# Patient Record
Sex: Female | Born: 1960 | Race: White | Hispanic: Yes | Marital: Married | Smoking: Never smoker
Health system: Southern US, Community
[De-identification: ages and names within clinical notes are randomized; demographics above are authoritative.]

## PROBLEM LIST (undated history)

## (undated) DIAGNOSIS — E119 Type 2 diabetes mellitus without complications: Secondary | ICD-10-CM

## (undated) DIAGNOSIS — D649 Anemia, unspecified: Secondary | ICD-10-CM

## (undated) DIAGNOSIS — C221 Intrahepatic bile duct carcinoma: Secondary | ICD-10-CM

## (undated) DIAGNOSIS — R87619 Unspecified abnormal cytological findings in specimens from cervix uteri: Secondary | ICD-10-CM

## (undated) HISTORY — PX: CHOLECYSTECTOMY: SHX55

## (undated) HISTORY — PX: ENDOMETRIAL ABLATION: SHX621

## (undated) HISTORY — PX: LEEP: SHX91

## (undated) HISTORY — DX: Unspecified abnormal cytological findings in specimens from cervix uteri: R87.619

## (undated) HISTORY — PX: HYSTEROSCOPY WITH D & C: SHX1775

---

## 2005-06-06 ENCOUNTER — Ambulatory Visit: Payer: Self-pay | Admitting: Unknown Physician Specialty

## 2006-06-12 ENCOUNTER — Ambulatory Visit: Payer: Self-pay

## 2006-10-14 ENCOUNTER — Ambulatory Visit: Payer: Self-pay | Admitting: Family Medicine

## 2007-06-15 ENCOUNTER — Ambulatory Visit: Payer: Self-pay | Admitting: Unknown Physician Specialty

## 2008-06-20 ENCOUNTER — Ambulatory Visit: Payer: Self-pay | Admitting: Unknown Physician Specialty

## 2009-06-29 ENCOUNTER — Ambulatory Visit: Payer: Self-pay | Admitting: Unknown Physician Specialty

## 2010-07-26 ENCOUNTER — Ambulatory Visit: Payer: Self-pay | Admitting: Unknown Physician Specialty

## 2011-09-05 ENCOUNTER — Ambulatory Visit: Payer: Self-pay | Admitting: Unknown Physician Specialty

## 2012-01-31 ENCOUNTER — Ambulatory Visit: Payer: Self-pay | Admitting: Obstetrics & Gynecology

## 2012-01-31 LAB — CBC
MCV: 82 fL (ref 80–100)
Platelet: 330 10*3/uL (ref 150–440)
RBC: 4.64 10*6/uL (ref 3.80–5.20)
RDW: 15.9 % — ABNORMAL HIGH (ref 11.5–14.5)

## 2012-01-31 LAB — PREGNANCY, URINE: Pregnancy Test, Urine: NEGATIVE m[IU]/mL

## 2012-02-07 ENCOUNTER — Ambulatory Visit: Payer: Self-pay | Admitting: Obstetrics & Gynecology

## 2012-02-10 LAB — PATHOLOGY REPORT

## 2012-09-21 ENCOUNTER — Ambulatory Visit: Payer: Self-pay | Admitting: Unknown Physician Specialty

## 2012-11-17 ENCOUNTER — Ambulatory Visit: Payer: Self-pay | Admitting: General Surgery

## 2014-07-25 DIAGNOSIS — E119 Type 2 diabetes mellitus without complications: Secondary | ICD-10-CM | POA: Insufficient documentation

## 2015-01-15 NOTE — Op Note (Signed)
PATIENT NAME:  Lauren Lindsey, Lauren Lindsey MR#:  [id number] DATE OF BIRTH:  1961-05-28  DATE OF PROCEDURE:  02/07/2012  PREOPERATIVE DIAGNOSES:  1. Abnormal uterine bleeding.  2. Abnormal endometrial thickness.    POSTOPERATIVE DIAGNOSES: 1. Abnormal uterine bleeding. 2. Abnormal endometrial thickness.  PROCEDURE:  1. Hysteroscopy.  2. Dilation and curettage.  3. Endometrial ablation.   SURGEON: Barnett Applebaum, M.D.   ANESTHESIA: General.   ESTIMATED BLOOD LOSS: Minimal.   COMPLICATIONS: None.   FINDINGS: Endometrial polyps.   DISPOSITION: To the recovery room in stable condition.   TECHNIQUE: The patient is prepped and draped in the usual sterile fashion after adequate anesthesia is obtained in the dorsal lithotomy position. The bladder is drained with a Robinson catheter. Speculum is placed and the anterior lip of the cervix is grasped with a tenaculum. The cervix and uterine lengths are measured using a uterine sound with the uterine length 9 cm and cervical length 3 cm. Gentle dilation of the cervix is performed with minimal dilation necessary. A 30-degree hysteroscope with lactated Ringer's distention of the intrauterine cavity is performed with minimal discrepancy of fluid throughout the entire case. Visualization of the endometrial cavity revealed a small endometrial polyp towards the lower uterine segment, normal cornua, and no other abnormalities other than thickened lining. The hysteroscope is removed and using polypectomy forceps the small polyp is removed. An endometrial curettage is  also performed with a banjo curette. Repeat visualization with the hysteroscope reveals complete resection of the polyp. The hysteroscope is removed and then the NovaSure endometrial device is placed. Cavity width is measured at 3 cm. Calculations from the NovaSure device places on a power of 99. The test and then procedure is performed without complication and lasts for 66 seconds. The NovaSure device is then  removed. Repeat hysteroscope reveals a blanched endometrial lining with no perforation or complications witnessed. The hysteroscope is again removed as well as the tenaculum from the cervix with hemostasis assured. The patient goes to the recovery room in stable condition. All sponge, instrument, and needle counts are correct.    ____________________________ R. Barnett Applebaum, MD [id number] D: 02/07/2012 10:44:50 ET T: 02/07/2012 11:41:41 ET JOB#: [id number]  cc: Glean Salen, MD, <Dictator> Gae Dry MD ELECTRONICALLY SIGNED 02/08/2012 9:17

## 2015-02-05 ENCOUNTER — Ambulatory Visit
Admission: EM | Admit: 2015-02-05 | Discharge: 2015-02-05 | Disposition: A | Discharge status: Home / Self Care | Payer: BLUE CROSS/BLUE SHIELD | Attending: Family Medicine | Admitting: Family Medicine

## 2015-02-05 ENCOUNTER — Encounter: Payer: Self-pay | Admitting: Emergency Medicine

## 2015-02-05 DIAGNOSIS — J029 Acute pharyngitis, unspecified: Secondary | ICD-10-CM

## 2015-02-05 DIAGNOSIS — J040 Acute laryngitis: Secondary | ICD-10-CM | POA: Diagnosis not present

## 2015-02-05 DIAGNOSIS — J301 Allergic rhinitis due to pollen: Secondary | ICD-10-CM | POA: Diagnosis not present

## 2015-02-05 MED ORDER — PREDNISONE 10 MG PO TABS: ORAL_TABLET | ORAL | Status: DC

## 2015-02-05 MED ORDER — FLUTICASONE PROPIONATE 50 MCG/ACT NA SUSP: 2.0000 | Freq: Two times a day (BID) | NASAL | Status: DC

## 2015-02-05 MED ORDER — FEXOFENADINE-PSEUDOEPHED ER 60-120 MG PO TB12: 1.0000 | ORAL_TABLET | Freq: Two times a day (BID) | ORAL | Status: DC

## 2015-02-05 NOTE — Discharge Instructions (Signed)
Allergies °Allergies may happen from anything your body is sensitive to. This may be food, medicines, pollens, chemicals, and nearly anything around you in everyday life that produces allergens. An allergen is anything that causes an allergy producing substance. Heredity is often a factor in causing these problems. This means you may have some of the same allergies as your parents. °Food allergies happen in all age groups. Food allergies are some of the most severe and life threatening. Some common food allergies are cow's milk, seafood, eggs, nuts, wheat, and soybeans. °SYMPTOMS  °· Swelling around the mouth. °· An itchy red rash or hives. °· Vomiting or diarrhea. °· Difficulty breathing. °SEVERE ALLERGIC REACTIONS ARE LIFE-THREATENING. °This reaction is called anaphylaxis. It can cause the mouth and throat to swell and cause difficulty with breathing and swallowing. In severe reactions only a trace amount of food (for example, peanut oil in a salad) may cause death within seconds. °Seasonal allergies occur in all age groups. These are seasonal because they usually occur during the same season every year. They may be a reaction to molds, grass pollens, or tree pollens. Other causes of problems are house dust mite allergens, pet dander, and mold spores. The symptoms often consist of nasal congestion, a runny itchy nose associated with sneezing, and tearing itchy eyes. There is often an associated itching of the mouth and ears. The problems happen when you come in contact with pollens and other allergens. Allergens are the particles in the air that the body reacts to with an allergic reaction. This causes you to release allergic antibodies. Through a chain of events, these eventually cause you to release histamine into the blood stream. Although it is meant to be protective to the body, it is this release that causes your discomfort. This is why you were given anti-histamines to feel better.  If you are unable to  pinpoint the offending allergen, it may be determined by skin or blood testing. Allergies cannot be cured but can be controlled with medicine. °Hay fever is a collection of all or some of the seasonal allergy problems. It may often be treated with simple over-the-counter medicine such as diphenhydramine. Take medicine as directed. Do not drink alcohol or drive while taking this medicine. Check with your caregiver or package insert for child dosages. °If these medicines are not effective, there are many new medicines your caregiver can prescribe. Stronger medicine such as nasal spray, eye drops, and corticosteroids may be used if the first things you try do not work well. Other treatments such as immunotherapy or desensitizing injections can be used if all else fails. Follow up with your caregiver if problems continue. These seasonal allergies are usually not life threatening. They are generally more of a nuisance that can often be handled using medicine. °HOME CARE INSTRUCTIONS  °· If unsure what causes a reaction, keep a diary of foods eaten and symptoms that follow. Avoid foods that cause reactions. °· If hives or rash are present: °· Take medicine as directed. °· You may use an over-the-counter antihistamine (diphenhydramine) for hives and itching as needed. °· Apply cold compresses (cloths) to the skin or take baths in cool water. Avoid hot baths or showers. Heat will make a rash and itching worse. °· If you are severely allergic: °· Following a treatment for a severe reaction, hospitalization is often required for closer follow-up. °· Wear a medic-alert bracelet or necklace stating the allergy. °· You and your family must learn how to give adrenaline or use   an anaphylaxis kit.  If you have had a severe reaction, always carry your anaphylaxis kit or EpiPen with you. Use this medicine as directed by your caregiver if a severe reaction is occurring. Failure to do so could have a fatal outcome. SEEK MEDICAL  CARE IF:  You suspect a food allergy. Symptoms generally happen within 30 minutes of eating a food.  Your symptoms have not gone away within 2 days or are getting worse.  You develop new symptoms.  You want to retest yourself or your child with a food or drink you think causes an allergic reaction. Never do this if an anaphylactic reaction to that food or drink has happened before. Only do this under the care of a caregiver. SEEK IMMEDIATE MEDICAL CARE IF:   You have difficulty breathing, are wheezing, or have a tight feeling in your chest or throat.  You have a swollen mouth, or you have hives, swelling, or itching all over your body.  You have had a severe reaction that has responded to your anaphylaxis kit or an EpiPen. These reactions may return when the medicine has worn off. These reactions should be considered life threatening. MAKE SURE YOU:   Understand these instructions.  Will watch your condition.  Will get help right away if you are not doing well or get worse. Document Released: 12/03/2002 Document Revised: 01/04/2013 Document Reviewed: 05/09/2008 [facility] Patient Information 2015 [place], [place]. This information is not intended to replace advice given to you by your health care provider. Make sure you discuss any questions you have with your health care provider.  Hay Fever Hay fever is an allergic reaction to particles in the air. It cannot be passed from person to person. It cannot be cured, but it can be controlled. CAUSES  Hay fever is caused by something that triggers an allergic reaction (allergens). The following are examples of allergens:  Ragweed.  Feathers.  Animal dander.  Grass and tree pollens.  Cigarette smoke.  House dust.  Pollution. SYMPTOMS   Sneezing.  Runny or stuffy nose.  Tearing eyes.  Itchy eyes, nose, mouth, throat, skin, or other area.  Sore throat.  Headache.  Decreased sense of smell or taste. DIAGNOSIS Your  caregiver will perform a physical exam and ask questions about the symptoms you are having.Allergy testing may be done to determine exactly what triggers your hay fever.  TREATMENT   Over-the-counter medicines may help symptoms. These include:  Antihistamines.  Decongestants. These may help with nasal congestion.  Your caregiver may prescribe medicines if over-the-counter medicines do not work.  Some people benefit from allergy shots when other medicines are not helpful. HOME CARE INSTRUCTIONS   Avoid the allergen that is causing your symptoms, if possible.  Take all medicine as told by your caregiver. SEEK MEDICAL CARE IF:   You have severe allergy symptoms and your current medicines are not helping.  Your treatment was working at one time, but you are now experiencing symptoms.  You have sinus congestion and pressure.  You develop a fever or headache.  You have thick nasal discharge.  You have asthma and have a worsening cough and wheezing. SEEK IMMEDIATE MEDICAL CARE IF:   You have swelling of your tongue or lips.  You have trouble breathing.  You feel lightheaded or like you are going to faint.  You have cold sweats.  You have a fever. Document Released: 09/09/2005 Document Revised: 12/02/2011 Document Reviewed: 12/05/2010 [facility] Patient Information 2015 [place], [place]. This information is  not intended to replace advice given to you by your health care provider. Make sure you discuss any questions you have with your health care provider.  Laryngitis At the top of your windpipe is your voice box. It is the source of your voice. Inside your voice box are 2 bands of muscles called vocal cords. When you breathe, your vocal cords are relaxed and open so that air can get into the lungs. When you decide to say something, these cords come together and vibrate. The sound from these vibrations goes into your throat and comes out through your mouth as sound. Laryngitis is  an inflammation of the vocal cords that causes hoarseness, cough, loss of voice, sore throat, and dry throat. Laryngitis can be temporary (acute) or long-term (chronic). Most cases of acute laryngitis improve with time.Chronic laryngitis lasts for more than 3 weeks. CAUSES Laryngitis can often be related to excessive smoking, talking, or yelling, as well as inhalation of toxic fumes and allergies. Acute laryngitis is usually caused by a viral infection, vocal strain, measles or mumps, or bacterial infections. Chronic laryngitis is usually caused by vocal cord strain, vocal cord injury, postnasal drip, growths on the vocal cords, or acid reflux. SYMPTOMS   Cough.  Sore throat.  Dry throat. RISK FACTORS  Respiratory infections.  Exposure to irritating substances, such as cigarette smoke, excessive amounts of alcohol, stomach acids, and workplace chemicals.  Voice trauma, such as vocal cord injury from shouting or speaking too loud. DIAGNOSIS  Your cargiver will perform a physical exam. During the physical exam, your caregiver will examine your throat. The most common sign of laryngitis is hoarseness. Laryngoscopy may be necessary to confirm the diagnosis of this condition. This procedure allows your caregiver to look into the larynx. HOME CARE INSTRUCTIONS  Drink enough fluids to keep your urine clear or pale yellow.  Rest until you no longer have symptoms or as directed by your caregiver.  Breathe in moist air.  Take all medicine as directed by your caregiver.  Do not smoke.  Talk as little as possible (this includes whispering).  Write on paper instead of talking until your voice is back to normal.  Follow up with your caregiver if your condition has not improved after 10 days. SEEK MEDICAL CARE IF:   You have trouble breathing.  You cough up blood.  You have persistent fever.  You have increasing pain.  You have difficulty swallowing. MAKE SURE YOU:  Understand these  instructions.  Will watch your condition.  Will get help right away if you are not doing well or get worse. Document Released: 09/09/2005 Document Revised: 12/02/2011 Document Reviewed: 11/15/2010 [facility] Patient Information 2015 [place], [place]. This information is not intended to replace advice given to you by your health care provider. Make sure you discuss any questions you have with your health care provider.  Sore Throat A sore throat is pain, burning, irritation, or scratchiness of the throat. There is often pain or tenderness when swallowing or talking. A sore throat may be accompanied by other symptoms, such as coughing, sneezing, fever, and swollen neck glands. A sore throat is often the first sign of another sickness, such as a cold, flu, strep throat, or mononucleosis (commonly known as mono). Most sore throats go away without medical treatment. CAUSES  The most common causes of a sore throat include:  A viral infection, such as a cold, flu, or mono.  A bacterial infection, such as strep throat, tonsillitis, or whooping cough.  Seasonal allergies.  Dryness in the air.  Irritants, such as smoke or pollution.  Gastroesophageal reflux disease (GERD). HOME CARE INSTRUCTIONS   Only take over-the-counter medicines as directed by your caregiver.  Drink enough fluids to keep your urine clear or pale yellow.  Rest as needed.  Try using throat sprays, lozenges, or sucking on hard candy to ease any pain (if older than 4 years or as directed).  Sip warm liquids, such as broth, herbal tea, or warm water with honey to relieve pain temporarily. You may also eat or drink cold or frozen liquids such as frozen ice pops.  Gargle with salt water (mix 1 tsp salt with 8 oz of water).  Do not smoke and avoid secondhand smoke.  Put a cool-mist humidifier in your bedroom at night to moisten the air. You can also turn on a hot shower and sit in the bathroom with the door closed for 5-10  minutes. SEEK IMMEDIATE MEDICAL CARE IF:  You have difficulty breathing.  You are unable to swallow fluids, soft foods, or your saliva.  You have increased swelling in the throat.  Your sore throat does not get better in 7 days.  You have nausea and vomiting.  You have a fever or persistent symptoms for more than 2-3 days.  You have a fever and your symptoms suddenly get worse. MAKE SURE YOU:   Understand these instructions.  Will watch your condition.  Will get help right away if you are not doing well or get worse. Document Released: 10/17/2004 Document Revised: 08/26/2012 Document Reviewed: 05/17/2012 [facility] Patient Information 2015 [place], [place]. This information is not intended to replace advice given to you by your health care provider. Make sure you discuss any questions you have with your health care provider.

## 2015-02-05 NOTE — ED Provider Notes (Signed)
CSN: [id number]     Arrival date & time 02/05/15  1024 History   First MD Initiated Contact with Patient 02/05/15 1221     Chief Complaint  Patient presents with  . Laryngitis   (Consider location/radiation/quality/duration/timing/severity/associated sxs/prior Treatment) HPI      54 year old female presents complaining of sore throat, congestion, rhinorrhea, sneezing, cough, losing her voice. She initially started having a sore throat and cough about 6 days ago.  This is been constant, not worsening nor improving. This started after she was exposed to a very large amount of pollen at work. Initially she started having itching of her throat and sneezing and the rest of her symptoms have started since then. Yesterday she felt like she lost her voice, that persists today. The cough is worse at night when she lays down. She denies any fever, chills, NVD, chest pain, shortness of breath. No recent travel or sick contacts.   History reviewed. No pertinent past medical history. History reviewed. No pertinent past surgical history. No family history on file. History  Substance Use Topics  . Smoking status: Never Smoker   . Smokeless tobacco: Not on file  . Alcohol Use: No   OB History    No data available     Review of Systems  Constitutional: Negative for fever and chills.  HENT: Positive for congestion, postnasal drip, rhinorrhea, sore throat and voice change. Negative for ear pain and sinus pressure.   Respiratory: Positive for cough. Negative for shortness of breath and wheezing.   Cardiovascular: Negative for chest pain.  Gastrointestinal: Negative for nausea, vomiting and diarrhea.  Skin: Negative for rash.  All other systems reviewed and are negative.   Allergies  Review of patient's allergies indicates no known allergies.  Home Medications   Prior to Admission medications   Medication Sig Start Date End Date Taking? Authorizing Provider  fexofenadine-pseudoephedrine  (ALLEGRA-D) 60-120 MG per tablet Take 1 tablet by mouth every 12 (twelve) hours. 02/05/15   Freeman Caldron Oaklyn Jakubek, PA-C  fluticasone (FLONASE) 50 MCG/ACT nasal spray Place 2 sprays into both nostrils 2 (two) times daily. Decrease to 2 sprays/nostril daily after 5 days 02/05/15   Liam Graham, PA-C  predniSONE (DELTASONE) 10 MG tablet 4 tabs PO QD for 4 days; 3 tabs PO QD for 3 days; 2 tabs PO QD for 2 days; 1 tab PO QD for 1 day 02/05/15   Liam Graham, PA-C   BP 144/67 mmHg  Pulse 76  Temp(Src) 97.5 F (36.4 C) (Tympanic)  Resp 20  Ht 5\' 5"  (1.651 m)  Wt 179 lb (81.194 kg)  BMI 29.79 kg/m2  SpO2 100%  LMP 01/05/2015 Physical Exam  Constitutional: She is oriented to person, place, and time. Vital signs are normal. She appears well-developed and well-nourished. No distress.  HENT:  Head: Normocephalic and atraumatic.  Right Ear: External ear normal.  Left Ear: External ear normal.  Nose: Nose normal. Right sinus exhibits no maxillary sinus tenderness and no frontal sinus tenderness. Left sinus exhibits no maxillary sinus tenderness and no frontal sinus tenderness.  Mouth/Throat: Oropharynx is clear and moist.  Eyes: Conjunctivae are normal.  Neck: Normal range of motion. Neck supple.  Cardiovascular: Normal rate, regular rhythm and normal heart sounds.   Pulmonary/Chest: Effort normal and breath sounds normal. No respiratory distress.  Lymphadenopathy:    She has no cervical adenopathy.  Neurological: She is alert and oriented to person, place, and time. She has normal strength. Coordination normal.  Skin: Skin  is warm and dry. No rash noted. She is not diaphoretic.  Psychiatric: She has a normal mood and affect. Judgment normal.  Nursing note and vitals reviewed.   ED Course  Procedures (including critical care time) Labs Review Labs Reviewed - No data to display  Imaging Review No results found.   MDM   1. Laryngitis   2. Allergic rhinitis due to pollen   3. Pharyngitis     her physical exam is normal apart from her obvious voice change. Symptoms are most likely related to allergies. We will initiate treatment today with a short course of prednisone as well as starting Allegra-D and Flonase. She will follow-up if she has any worsening in her condition.  Meds ordered this encounter  Medications  . predniSONE (DELTASONE) 10 MG tablet    Sig: 4 tabs PO QD for 4 days; 3 tabs PO QD for 3 days; 2 tabs PO QD for 2 days; 1 tab PO QD for 1 day    Dispense:  30 tablet    Refill:  0    Order Specific Question:  Supervising Provider    Answer:  Sherlene Shams [[id number]]  . fluticasone (FLONASE) 50 MCG/ACT nasal spray    Sig: Place 2 sprays into both nostrils 2 (two) times daily. Decrease to 2 sprays/nostril daily after 5 days    Dispense:  16 g    Refill:  2    Order Specific Question:  Supervising Provider    Answer:  Sherlene Shams [[id number]]  . fexofenadine-pseudoephedrine (ALLEGRA-D) 60-120 MG per tablet    Sig: Take 1 tablet by mouth every 12 (twelve) hours.    Dispense:  30 tablet    Refill:  0    Order Specific Question:  Supervising Provider    Answer:  Sherlene Shams [[id number]]     Liam Graham, PA-C 02/05/15 [phone]

## 2015-02-05 NOTE — ED Notes (Signed)
Pt c/o sorethroat and cough last week and yesterday started loosing her voice associated with greenish sputum, denies any fever at this time

## 2016-07-10 LAB — HM PAP SMEAR: HM Pap smear: NORMAL

## 2016-07-19 ENCOUNTER — Ambulatory Visit
Admission: EM | Admit: 2016-07-19 | Discharge: 2016-07-19 | Disposition: A | Discharge status: Home / Self Care | Payer: PRIVATE HEALTH INSURANCE | Attending: Family Medicine | Admitting: Family Medicine

## 2016-07-19 ENCOUNTER — Encounter: Payer: Self-pay | Admitting: *Deleted

## 2016-07-19 DIAGNOSIS — R58 Hemorrhage, not elsewhere classified: Secondary | ICD-10-CM | POA: Diagnosis not present

## 2016-07-19 HISTORY — DX: Type 2 diabetes mellitus without complications: E11.9

## 2016-07-19 MED ORDER — MUPIROCIN 2 % EX OINT: 1.0000 "application " | TOPICAL_OINTMENT | Freq: Three times a day (TID) | CUTANEOUS | 0 refills | Status: DC

## 2016-07-19 NOTE — Discharge Instructions (Signed)
Keep leg elevated above the heart most of today. Change dressing tomorrow and apply Bactroban 3 times daily.

## 2016-07-19 NOTE — ED Triage Notes (Addendum)
Patient scratched her right lower leg just before admission today and her right leg began bleeding heavily. No previous history bleeding disorder or anticoags.

## 2016-07-19 NOTE — ED Provider Notes (Signed)
CSN: [id number]     Arrival date & time 07/19/16  1346 History   None    Chief Complaint  Patient presents with  . Bleeding/Bruising   (Consider location/radiation/quality/duration/timing/severity/associated sxs/prior Treatment) HPI   This is a 55 year old female who today scratched her left lower leg over a tortuous varicosity began to bleed very heavily. Patient is not taking anticoagulation and has no previous history of any bleeding disorders. She had trouble keeping the wound from bleeding which was described as more pulsatile. He presented to our facility and was placed in a compressive dressing which reportedly soaked through several dressings. Vital signs remained stable throughout her entire stay.      Past Medical History:  Diagnosis Date  . Diabetes mellitus without complication [facility])    Past Surgical History:  Procedure Laterality Date  . ABDOMINAL HYSTERECTOMY     Family History  Problem Relation Age of Onset  . Bleeding Disorder Mother   . Biliary Cirrhosis Mother   . Cancer Father   . Diabetes Father    Social History  Substance Use Topics  . Smoking status: Never Smoker  . Smokeless tobacco: Never Used  . Alcohol use No   OB History    No data available     Review of Systems  Constitutional: Positive for activity change. Negative for chills, fatigue and fever.  Skin: Positive for wound.  All other systems reviewed and are negative.   Allergies  Review of patient's allergies indicates no known allergies.  Home Medications   Prior to Admission medications   Medication Sig Start Date End Date Taking? Authorizing Provider  mupirocin ointment (BACTROBAN) 2 % Apply 1 application topically 3 (three) times daily. 07/19/16   Lorin Picket, PA-C   Meds Ordered and Administered this Visit  Medications - No data to display  BP (!) 143/65 (BP Location: Left Arm)   Pulse 73   Temp 97.3 F (36.3 C) (Tympanic)   Resp 18   Ht 5\' 5"  (1.651 m)   Wt  180 lb (81.6 kg)   LMP 06/19/2016   SpO2 99%   BMI 29.95 kg/m  No data found.   Physical Exam  Constitutional: She is oriented to person, place, and time. She appears well-developed and well-nourished. No distress.  HENT:  Head: Normocephalic and atraumatic.  Eyes: EOM are normal. Pupils are equal, round, and reactive to light.  Neck: Normal range of motion. Neck supple.  Musculoskeletal: Normal range of motion. She exhibits edema, tenderness and deformity.  Neurological: She is alert and oriented to person, place, and time.  Skin: Skin is warm and dry. She is not diaphoretic.  Patient was placed in reverse Trendelenburg and the compression wrapping was then iced. After 15 minutes of compression and elevation and icing the wound was exposed showing a small scratch over a varicosity. The bleeding had subsided.  Psychiatric: She has a normal mood and affect. Her behavior is normal. Judgment and thought content normal.  Nursing note and vitals reviewed.   Urgent Care Course   Clinical Course    Procedures (including critical care time)  Labs Review Labs Reviewed - No data to display  Imaging Review No results found.   Visual Acuity Review  Right Eye Distance:   Left Eye Distance:   Bilateral Distance:    Right Eye Near:   Left Eye Near:    Bilateral Near:     Procedure: Patient was placed in reverse Trendelenburg with compression dressing applied to the  left lower extremity. Ice was also applied. This was maintained for 15 minutes. The compression dressing was then removed showing that a small excoriation was directly over a very tortuous varicosity. Bleeding had subsided. The wound was then thoroughly washed and cleansed with a Hibiclens solution. Another lighter compression dressing was applied holding in place with Coban band.    MDM   1. Venous hemorrhage    Discharge Medication List as of 07/19/2016  2:32 PM    START taking these medications   Details   mupirocin ointment (BACTROBAN) 2 % Apply 1 application topically 3 (three) times daily., Starting Fri 07/19/2016, Normal      Plan: 1. Test/x-ray results and diagnosis reviewed with patient 2. rx as per orders; risks, benefits, potential side effects reviewed with patient 3. Recommend supportive treatment with Elevation and ice this evening. She is to keep the compression dressing this evening and removed it tomorrow. I will keep her out of work tonight and then allow her to return full duty unrestricted tomorrow. I've asked her to begin using Bactroban ointment to prevent secondary infection from the scratch for the next week 3 times daily. She may benefit from use of compression hose at work since she stands all day long. If she has other further bleeding tonight or tomorrow she'll return to our clinic during operating hours or go to the emergency room. 4. F/u prn if symptoms worsen or don't improve     Lorin Picket, PA-C 07/19/16 [place] Pervis Macintyre, PA-C 07/19/16 1943

## 2016-07-29 ENCOUNTER — Other Ambulatory Visit: Payer: Self-pay | Admitting: Obstetrics and Gynecology

## 2016-07-29 DIAGNOSIS — Z1231 Encounter for screening mammogram for malignant neoplasm of breast: Secondary | ICD-10-CM

## 2016-08-02 ENCOUNTER — Ambulatory Visit
Admission: RE | Admit: 2016-08-02 | Discharge: 2016-08-02 | Disposition: A | Discharge status: Home / Self Care | Payer: PRIVATE HEALTH INSURANCE | Source: Ambulatory Visit | Attending: Obstetrics and Gynecology | Admitting: Obstetrics and Gynecology

## 2016-08-02 DIAGNOSIS — Z1231 Encounter for screening mammogram for malignant neoplasm of breast: Secondary | ICD-10-CM | POA: Diagnosis present

## 2016-08-05 ENCOUNTER — Encounter (INDEPENDENT_AMBULATORY_CARE_PROVIDER_SITE_OTHER): Payer: Self-pay | Admitting: Vascular Surgery

## 2016-08-05 ENCOUNTER — Ambulatory Visit (INDEPENDENT_AMBULATORY_CARE_PROVIDER_SITE_OTHER): Payer: PRIVATE HEALTH INSURANCE | Admitting: Vascular Surgery

## 2016-08-05 VITALS — BP 121/65 | HR 67 | Resp 16 | Ht 65.0 in | Wt 173.0 lb

## 2016-08-05 DIAGNOSIS — I872 Venous insufficiency (chronic) (peripheral): Secondary | ICD-10-CM | POA: Insufficient documentation

## 2016-08-05 DIAGNOSIS — I83892 Varicose veins of left lower extremities with other complications: Secondary | ICD-10-CM

## 2016-08-05 NOTE — Progress Notes (Signed)
MRN : [id number]  Lauren Lindsey is a 55 y.o. (09-17-1961) female who presents with chief complaint of  Chief Complaint  Patient presents with  . New Evaluation    Varicose vein bleeding  .  History of Present Illness: The patient is seen for evaluation of symptomatic varicose veins. The patient relates burning and stinging which worsened steadily throughout the course of the day, particularly with standing. The patient also notes an aching and throbbing pain over the varicosities, particularly with prolonged dependent positions. The symptoms are significantly improved with elevation.  The patient also notes that during hot weather the symptoms are greatly intensified. The patient states the pain from the varicose veins interferes with work, daily exercise, shopping and household maintenance. At this point, the symptoms are persistent and severe enough that they're having a negative impact on lifestyle and are interfering with daily activities.  There is no history of DVT, PE or superficial thrombophlebitis. There is no history of ulceration or hemorrhage. The patient denies a significant family history of varicose veins. OB history: P2  The patient has not worn graduated compression in the past. At the present time the patient has not been using over-the-counter analgesics. There is no history of prior surgical intervention or sclerotherapy.    Current Meds  Medication Sig  . mupirocin ointment (BACTROBAN) 2 % Apply 1 application topically 3 (three) times daily.    Past Medical History:  Diagnosis Date  . Diabetes mellitus without complication [facility])     Past Surgical History:  Procedure Laterality Date  . ABDOMINAL HYSTERECTOMY      Social History Social History  Substance Use Topics  . Smoking status: Never Smoker  . Smokeless tobacco: Never Used  . Alcohol use No    Family History Family History  Problem Relation Age of Onset  . Bleeding Disorder Mother   .  Biliary Cirrhosis Mother   . Cancer Father   . Diabetes Father   No family history of bleeding clotting disorders, porphyria or autoimmune disease.  No Known Allergies   REVIEW OF SYSTEMS (Negative unless checked)  Constitutional: [] Weight loss  [] Fever  [] Chills Cardiac: [] Chest pain   [] Chest pressure   [] Palpitations   [] Shortness of breath when laying flat   [] Shortness of breath with exertion. Vascular:  [] Pain in legs with walking   [] Pain in legs at rest  [] History of DVT   [] Phlebitis   [x] Swelling in legs   [x] Varicose veins   [x] Non-healing ulcers Pulmonary:   [] Uses home oxygen   [] Productive cough   [] Hemoptysis   [] Wheeze  [] COPD   [] Asthma Neurologic:  [] Dizziness   [] Seizures   [] History of stroke   [] History of TIA  [] Aphasia   [] Vissual changes   [] Weakness or numbness in arm   [] Weakness or numbness in leg Musculoskeletal:   [] Joint swelling   [] Joint pain   [] Low back pain Hematologic:  [] Easy bruising  [] Easy bleeding   [] Hypercoagulable state   [] Anemic Gastrointestinal:  [] Diarrhea   [] Vomiting  [] Gastroesophageal reflux/heartburn   [] Difficulty swallowing. Genitourinary:  [] Chronic kidney disease   [] Difficult urination  [] Frequent urination   [] Blood in urine Skin:  [] Rashes   [x] Ulcers  Psychological:  [] History of anxiety   []  History of major depression.  Physical Examination  Vitals:   08/05/16 1405  BP: 121/65  Pulse: 67  Resp: 16  Weight: 173 lb (78.5 kg)  Height: 5\' 5"  (1.651 m)   Body mass index is 28.79  kg/m. Gen: WD/WN, NAD Head: [place]/AT, No temporalis wasting.  Ear/Nose/Throat: Hearing grossly intact, nares w/o erythema or drainage, poor dentition Eyes: PER, EOMI, sclera nonicteric.  Neck: Supple, no masses.  No bruit or JVD.  Pulmonary:  Good air movement, clear to auscultation bilaterally, no use of accessory muscles.  Cardiac: RRR, normal S1, S2, no Murmurs. Vascular: Varicosities noted greater than 7 mm in diameter left leg more affected  than the right. Mild venous stasis changes noted left ankle. There is a dense cluster just above the medial malleolus on the left which has a small ulcer with thrombosis in the bed. Vessel Right Left  Radial Palpable Palpable  Ulnar Palpable Palpable  Brachial Palpable Palpable  Carotid Palpable Palpable  Femoral Palpable Palpable  Popliteal Palpable Palpable  PT Palpable Palpable  DP Palpable Palpable   Gastrointestinal: soft, non-distended. No guarding/no peritoneal signs.  Musculoskeletal: M/S 5/5 throughout.  No deformity or atrophy.  Neurologic: CN 2-12 intact. Pain and light touch intact in extremities.  Symmetrical.  Speech is fluent. Motor exam as listed above. Psychiatric: Judgment intact, Mood & affect appropriate for pt's clinical situation. Dermatologic: No rashes or ulcers noted.  No changes consistent with cellulitis. Lymph : No Cervical lymphadenopathy, no lichenification or skin changes of chronic lymphedema.  CBC Lab Results  Component Value Date   WBC 7.2 01/31/2012   HGB 12.4 01/31/2012   HCT 37.9 01/31/2012   MCV 82 01/31/2012   PLT 330 01/31/2012    BMET No results found for: NA, K, CL, CO2, GLUCOSE, BUN, CREATININE, CALCIUM, GFRNONAA, GFRAA CrCl cannot be calculated (No order found.).  COAG No results found for: INR, PROTIME  Radiology Mm Digital Screening Bilateral  Result Date: 08/02/2016 CLINICAL DATA:  Screening. EXAM: DIGITAL SCREENING BILATERAL MAMMOGRAM WITH CAD COMPARISON:  Previous exam(s). ACR Breast Density Category b: There are scattered areas of fibroglandular density. FINDINGS: There are no findings suspicious for malignancy. Images were processed with CAD. IMPRESSION: No mammographic evidence of malignancy. A result letter of this screening mammogram will be mailed directly to the patient. RECOMMENDATION: Screening mammogram in one year. (Code:SM-B-01Y) BI-RADS CATEGORY  1: Negative. Electronically Signed   By: Everlean Alstrom M.D.   On:  08/02/2016 12:14     Assessment/Plan 1. Hemorrhage of varicose veins of left lower extremity  Recommend:  The patient has large symptomatic varicose veins that are painful and associated with swelling. She has now had several episodes of bleeding and a small ulcerated area.  I have had a long discussion with the patient regarding  varicose veins and why they cause symptoms.  Patient will begin wearing graduated compression stockings class 1 on a daily basis, beginning first thing in the morning and removing them in the evening. The patient is instructed specifically not to sleep in the stockings.    The patient  will also begin using over-the-counter analgesics such as Motrin 600 mg po TID to help control the symptoms.    In addition, behavioral modification including elevation during the day will be initiated.    Pending the results of these changes the  patient will be reevaluated ASAP.   An  ultrasound of the venous system will be obtained ASAP.   Further plans will be based on the ultrasound results and whether conservative therapies are successful at eliminating the pain and swelling.   - VAS [place] LOWER EXTREMITY VENOUS REFLUX; Future  2. Chronic venous insufficiency Continue compression    Hortencia Pilar, MD  08/05/2016 2:37 PM

## 2016-08-06 ENCOUNTER — Ambulatory Visit (INDEPENDENT_AMBULATORY_CARE_PROVIDER_SITE_OTHER): Payer: PRIVATE HEALTH INSURANCE

## 2016-08-06 ENCOUNTER — Ambulatory Visit (INDEPENDENT_AMBULATORY_CARE_PROVIDER_SITE_OTHER): Payer: PRIVATE HEALTH INSURANCE | Admitting: Vascular Surgery

## 2016-08-06 ENCOUNTER — Encounter (INDEPENDENT_AMBULATORY_CARE_PROVIDER_SITE_OTHER): Payer: Self-pay | Admitting: Vascular Surgery

## 2016-08-06 VITALS — BP 129/75 | HR 70 | Resp 17 | Ht 65.0 in | Wt 173.0 lb

## 2016-08-06 DIAGNOSIS — I872 Venous insufficiency (chronic) (peripheral): Secondary | ICD-10-CM

## 2016-08-06 DIAGNOSIS — I83892 Varicose veins of left lower extremities with other complications: Secondary | ICD-10-CM

## 2016-08-06 NOTE — Progress Notes (Signed)
Subjective:    Patient ID: Lauren Lindsey, female    DOB: 1961-02-22, 55 y.o.   MRN: [id number] Chief Complaint  Patient presents with  . Follow-up   Patient presents to review vascular studies. Last seen yesterday with a chief complaint of hemorrhage from a varicosity located near her left ankle. Patient underwent a left lower extremity venous duplex which was notable for DVT, SVT and reflux in the GSV (midthigh to just below knee).   Review of Systems Review of Systems  Constitutional: Negative.   HENT: Negative.   Eyes: Negative.   Respiratory: Negative.   Cardiovascular: Negative.   Gastrointestinal: Negative.   Endocrine: Negative.   Genitourinary: Negative.   Musculoskeletal: Negative.   Skin:       Bleeding left ankle varicosity.  Allergic/Immunologic: Negative.   Neurological: Negative.   Hematological: Negative.   Psychiatric/Behavioral: Negative.       Objective:   Physical Exam Physical Exam  Constitutional: She is oriented to person, place, and time. She appears well-developed and well-nourished.  HENT:  Head: Normocephalic and atraumatic.  Eyes: Conjunctivae and EOM are normal. Pupils are equal, round, and reactive to light.  Neck: Normal range of motion.  Cardiovascular: Normal rate, regular rhythm, normal heart sounds and intact distal pulses.   Pulses:      Radial pulses are 2+ on the right side, and 2+ on the left side.       Dorsalis pedis pulses are 2+ on the right side, and 2+ on the left side.       Posterior tibial pulses are 2+ on the right side, and 2+ on the left side.  Pulmonary/Chest: Effort normal and breath sounds normal.  Abdominal: Soft. Bowel sounds are normal.  Musculoskeletal: Normal range of motion. She exhibits edema (Mild Bilateral Edema).  Neurological: She is alert and oriented to person, place, and time.  Skin:  Medial aspect of left ankle. Small skin ulceration noted right on top of varicosities with notable about of surrounding  varicosities.   Psychiatric: She has a normal mood and affect. Her behavior is normal. Judgment and thought content normal.   BP 129/75   Pulse 70   Resp 17   Ht 5\' 5"  (1.651 m)   Wt 173 lb (78.5 kg)   LMP 07/20/2016   BMI 28.79 kg/m   Past Medical History:  Diagnosis Date  . Diabetes mellitus without complication [facility])    Social History   Social History  . Marital status: Married    Spouse name: N/A  . Number of children: N/A  . Years of education: N/A   Occupational History  . Not on file.   Social History Main Topics  . Smoking status: Never Smoker  . Smokeless tobacco: Never Used  . Alcohol use No  . Drug use: No  . Sexual activity: Not on file   Other Topics Concern  . Not on file   Social History Narrative  . No narrative on file   Past Surgical History:  Procedure Laterality Date  . ABDOMINAL HYSTERECTOMY     Family History  Problem Relation Age of Onset  . Bleeding Disorder Mother   . Biliary Cirrhosis Mother   . Cancer Father   . Diabetes Father    No Known Allergies     Assessment & Plan:   Patient presents to review vascular studies. Last seen yesterday with a chief complaint of hemorrhage from a varicosity located near her left ankle. Patient underwent a  left lower extremity venous duplex which was notable for DVT, SVT and reflux in the GSV (midthigh to just below knee).   1. Hemorrhage of varicose veins of lower extremity, left - Stable Will apply for saline sclerotherapy to area in an attempt to prevent another hemorrhage requiring treatment in the ED.   2. Varicose veins, lower extremity, with inflammation, ulcerated, left (HCC) - Stable The patient was encouraged to wear graduated compression stockings (20-30 mmHg) on a daily basis. The patient was instructed to begin wearing the stockings first thing in the morning and removing them in the evening. The patient was instructed specifically not to sleep in the stockings. Prescription In  addition, behavioral modification including elevation during the day will be initiated. Anti-inflammatories for pain.  3. Chronic venous insufficiency - New The patient is likely to benefit from endovenous laser ablation. I have discussed the risks and benefits of the procedure. The risks primarily include DVT, recanalization, bleeding, infection, and inability to gain access. Will focus on area of ulceration due to causing hemorrhage first.   Current Outpatient Prescriptions on File Prior to Visit  Medication Sig Dispense Refill  . mupirocin ointment (BACTROBAN) 2 % Apply 1 application topically 3 (three) times daily. 22 g 0   No current facility-administered medications on file prior to visit.     There are no Patient Instructions on file for this visit. No Follow-up on file.   Jabaree Mercado A Emiko Osorto, PA-C

## 2016-08-12 ENCOUNTER — Ambulatory Visit (INDEPENDENT_AMBULATORY_CARE_PROVIDER_SITE_OTHER): Payer: PRIVATE HEALTH INSURANCE | Admitting: Vascular Surgery

## 2016-08-12 ENCOUNTER — Encounter (INDEPENDENT_AMBULATORY_CARE_PROVIDER_SITE_OTHER): Payer: Self-pay | Admitting: Vascular Surgery

## 2016-08-12 VITALS — BP 126/71 | HR 62 | Resp 17 | Wt 174.0 lb

## 2016-08-12 DIAGNOSIS — I872 Venous insufficiency (chronic) (peripheral): Secondary | ICD-10-CM

## 2016-08-12 NOTE — Progress Notes (Signed)
Varicose veins of left lower extremity with Hemorrhage  Current Plans  Indication: Patient presents with symptomatic varicose veins / hemorrhage of the leftlower extremity.  Procedure: Sclerotherapy using hypertonic saline mixed with 1% Lidocaine was performed on the leftlower extremity. Compression wraps were placed. The patient tolerated the procedure well.  Plan: Follow up as needed.

## 2016-09-26 ENCOUNTER — Ambulatory Visit (INDEPENDENT_AMBULATORY_CARE_PROVIDER_SITE_OTHER): Payer: PRIVATE HEALTH INSURANCE | Admitting: Vascular Surgery

## 2016-09-30 ENCOUNTER — Encounter (INDEPENDENT_AMBULATORY_CARE_PROVIDER_SITE_OTHER): Payer: Self-pay | Admitting: Vascular Surgery

## 2016-09-30 ENCOUNTER — Ambulatory Visit (INDEPENDENT_AMBULATORY_CARE_PROVIDER_SITE_OTHER): Payer: PRIVATE HEALTH INSURANCE | Admitting: Vascular Surgery

## 2016-09-30 VITALS — BP 107/70 | HR 58 | Resp 17 | Wt 173.0 lb

## 2016-09-30 DIAGNOSIS — I872 Venous insufficiency (chronic) (peripheral): Secondary | ICD-10-CM

## 2016-09-30 NOTE — Progress Notes (Signed)
Varicose veins of left lower extremity with Hemorrhage:  Current Plans:  Indication: Patient presents with symptomatic varicose veins / hemorrhage of the leftlower extremity.  Procedure: Sclerotherapy using hypertonic saline mixed with 1% Lidocaine was performed on the leftlower extremity. Compression wraps were placed. The patient tolerated the procedure well.  Plan: Follow up as needed.

## 2016-10-28 ENCOUNTER — Ambulatory Visit (INDEPENDENT_AMBULATORY_CARE_PROVIDER_SITE_OTHER): Payer: PRIVATE HEALTH INSURANCE | Admitting: Vascular Surgery

## 2016-11-11 ENCOUNTER — Ambulatory Visit (INDEPENDENT_AMBULATORY_CARE_PROVIDER_SITE_OTHER): Payer: PRIVATE HEALTH INSURANCE | Admitting: Vascular Surgery

## 2016-11-26 ENCOUNTER — Encounter (INDEPENDENT_AMBULATORY_CARE_PROVIDER_SITE_OTHER): Payer: Self-pay

## 2016-11-26 ENCOUNTER — Encounter (INDEPENDENT_AMBULATORY_CARE_PROVIDER_SITE_OTHER): Payer: Self-pay | Admitting: Vascular Surgery

## 2016-11-26 ENCOUNTER — Ambulatory Visit (INDEPENDENT_AMBULATORY_CARE_PROVIDER_SITE_OTHER): Payer: PRIVATE HEALTH INSURANCE | Admitting: Vascular Surgery

## 2016-11-26 VITALS — BP 109/55 | HR 56 | Resp 16 | Ht 65.0 in | Wt 176.0 lb

## 2016-11-26 DIAGNOSIS — I83812 Varicose veins of left lower extremities with pain: Secondary | ICD-10-CM

## 2016-11-26 DIAGNOSIS — I872 Venous insufficiency (chronic) (peripheral): Secondary | ICD-10-CM

## 2016-11-26 NOTE — Progress Notes (Signed)
Varicose veins of left lower extremity with Hemorrhage:  Current Plans:  Indication: Patient presents with symptomatic varicose veins / hemorrhage of the leftlower extremity.  Procedure: Sclerotherapy using hypertonic saline mixed with 1% Lidocaine was performed on the leftlower extremity. Compression wraps were placed. The patient tolerated the procedure well.  Plan: Follow up as needed.

## 2016-12-19 ENCOUNTER — Ambulatory Visit (INDEPENDENT_AMBULATORY_CARE_PROVIDER_SITE_OTHER): Payer: PRIVATE HEALTH INSURANCE | Admitting: Vascular Surgery

## 2016-12-19 ENCOUNTER — Encounter (INDEPENDENT_AMBULATORY_CARE_PROVIDER_SITE_OTHER): Payer: Self-pay

## 2016-12-19 ENCOUNTER — Encounter (INDEPENDENT_AMBULATORY_CARE_PROVIDER_SITE_OTHER): Payer: Self-pay | Admitting: Vascular Surgery

## 2016-12-19 VITALS — BP 95/58 | HR 65 | Resp 17 | Wt 175.0 lb

## 2016-12-19 DIAGNOSIS — I872 Venous insufficiency (chronic) (peripheral): Secondary | ICD-10-CM

## 2016-12-19 NOTE — Progress Notes (Signed)
Varicose veins of left lower extremity with inflammation (454.1  I83.10) Current Plans   Indication: Patient presents with symptomatic varicose veins of the left lower extremity.   Procedure: Sclerotherapy using hypertonic saline mixed with 1% Lidocaine was performed on the left lower extremity. Compression wraps were placed. The patient tolerated the procedure well. 

## 2017-10-24 ENCOUNTER — Encounter: Payer: Self-pay | Admitting: Obstetrics and Gynecology

## 2017-10-24 ENCOUNTER — Ambulatory Visit (INDEPENDENT_AMBULATORY_CARE_PROVIDER_SITE_OTHER): Payer: PRIVATE HEALTH INSURANCE | Admitting: Obstetrics and Gynecology

## 2017-10-24 VITALS — BP 124/78 | Ht 65.0 in | Wt 186.0 lb

## 2017-10-24 DIAGNOSIS — Z1339 Encounter for screening examination for other mental health and behavioral disorders: Secondary | ICD-10-CM | POA: Diagnosis not present

## 2017-10-24 DIAGNOSIS — Z01419 Encounter for gynecological examination (general) (routine) without abnormal findings: Secondary | ICD-10-CM

## 2017-10-24 DIAGNOSIS — Z1331 Encounter for screening for depression: Secondary | ICD-10-CM | POA: Diagnosis not present

## 2017-10-24 DIAGNOSIS — L720 Epidermal cyst: Secondary | ICD-10-CM

## 2017-10-24 NOTE — Progress Notes (Signed)
Routine Annual Gynecology Examination   PCP: [name], MD  Chief Complaint  Patient presents with  . Annual Exam   History of Present Illness: Patient is a 57 y.o. G0P0000 presents for annual exam. The patient has no complaints today.   Menopausal bleeding: still menstruatin. Had monthly menses until October of last year. Then, no menses until yesterday.  Menopausal symptoms: denies  Breast symptoms: denies  Last pap smear: 06/2016 Result Normal  Last mammogram: 07/2016 Result Normal   Noted a bump about 1 week ago on her right vulva.  Not painful.   Past Medical History:  Diagnosis Date  . Abnormal Pap smear of cervix   . Diabetes mellitus without complication [facility])     Past Surgical History:  Procedure Laterality Date  . ENDOMETRIAL ABLATION    . HYSTEROSCOPY W/D&C    . LEEP      Medications: Denies   Allergies: No Known Allergies  Obstetric History: G0P0000  Social History   Socioeconomic History  . Marital status: Married    Spouse name: Not on file  . Number of children: Not on file  . Years of education: Not on file  . Highest education level: Not on file  Social Needs  . Financial resource strain: Not on file  . Food insecurity - worry: Not on file  . Food insecurity - inability: Not on file  . Transportation needs - medical: Not on file  . Transportation needs - non-medical: Not on file  Occupational History  . Not on file  Tobacco Use  . Smoking status: Never Smoker  . Smokeless tobacco: Never Used  Substance and Sexual Activity  . Alcohol use: No  . Drug use: No  . Sexual activity: Yes  Other Topics Concern  . Not on file  Social History Narrative  . Not on file    Family History  Problem Relation Age of Onset  . Bleeding Disorder Mother   . Biliary Cirrhosis Mother   . Liver cancer Mother   . Hypertension Mother   . Cancer Father   . Diabetes Father     Review of Systems  Constitutional: Negative.   HENT:  Negative.   Eyes: Negative.   Respiratory: Negative.   Cardiovascular: Negative.   Gastrointestinal: Negative.   Genitourinary: Negative.   Musculoskeletal: Negative.   Skin: Negative.   Neurological: Negative.   Psychiatric/Behavioral: Negative.      Physical Exam Vitals: BP 124/78   Ht 5\' 5"  (1.651 m)   Wt 186 lb (84.4 kg)   LMP 10/23/2017   BMI 30.95 kg/m   Physical Exam  Constitutional: She is oriented to person, place, and time. She appears well-developed and well-nourished. No distress.  Genitourinary: Uterus normal. Pelvic exam was performed with patient supine.  There is lesion (see image (~1.32mm nodule, mobile, nontender, no erythema) on the right labia. There is no rash, tenderness or injury on the right labia. There is no rash, tenderness, lesion or injury on the left labia.    No erythema, tenderness or bleeding in the vagina. No signs of injury around the vagina. No vaginal discharge found. Right adnexum does not display mass, does not display tenderness and does not display fullness. Left adnexum does not display mass, does not display tenderness and does not display fullness. Cervix does not exhibit motion tenderness, lesion, discharge or polyp.   Uterus is mobile and anteverted. Uterus is not enlarged, tender or exhibiting a mass.  HENT:  Head: Normocephalic  and atraumatic.  Eyes: EOM are normal. No scleral icterus.  Neck: Normal range of motion. Neck supple. No thyromegaly present.  Cardiovascular: Normal rate and regular rhythm. Exam reveals no gallop and no friction rub.  No murmur heard. Pulmonary/Chest: Effort normal and breath sounds normal. No respiratory distress. She has no wheezes. She has no rales. Right breast exhibits no inverted nipple, no mass, no nipple discharge, no skin change and no tenderness. Left breast exhibits no inverted nipple, no mass, no nipple discharge, no skin change and no tenderness.  Abdominal: Soft. Bowel sounds are normal. She  exhibits no distension and no mass. There is no tenderness. There is no rebound and no guarding.  Musculoskeletal: Normal range of motion. She exhibits no edema or tenderness.  Lymphadenopathy:    She has no cervical adenopathy.       Right: No inguinal adenopathy present.       Left: No inguinal adenopathy present.  Neurological: She is alert and oriented to person, place, and time. No cranial nerve deficit.  Skin: Skin is warm and dry. No rash noted. No erythema.  Psychiatric: She has a normal mood and affect. Her behavior is normal. Judgment normal.     Female chaperone present for pelvic and breast  portions of the physical exam  Results: AUDIT Questionnaire (screen for alcoholism): 0 PHQ-9: 0   Assessment and Plan:  57 y.o. G0P0000 female here for routine annual gynecologic examination  Plan: Problem List Items Addressed This Visit    None    Visit Diagnoses    Women's annual routine gynecological examination    -  Primary   Screening for depression       Screening for alcoholism       Epidermal inclusion cyst          Screening: -- Blood pressure screen normal -- Colonoscopy - not due -- Mammogram - due. Patient to call [place] to arrange. She understands that it is her responsibility to arrange this. -- Weight screening: obese: discussed management options, including lifestyle, dietary, and exercise. -- Depression screening negative (PHQ-9) -- Nutrition: normal -- cholesterol screening: per PCP -- osteoporosis screening: not due -- tobacco screening: not using -- alcohol screening: AUDIT questionnaire indicates low-risk usage. -- family history of breast cancer screening: done. not at high risk. -- no evidence of domestic violence or intimate partner violence. -- STD screening: gonorrhea/chlamydia NAAT not collected per patient request. -- pap smear not collected per ASCCP guidelines -- flu vaccine declines -- HPV vaccination series: not eligilbe  Epidermal  inclusion cyst of labium minus: patient reassured that this is a normal and common finding that is benign. Continue to monitor.   [name], MD 10/27/2017 3:07 PM

## 2017-11-05 ENCOUNTER — Other Ambulatory Visit: Payer: Self-pay | Admitting: Obstetrics and Gynecology

## 2017-11-05 DIAGNOSIS — Z1231 Encounter for screening mammogram for malignant neoplasm of breast: Secondary | ICD-10-CM

## 2017-11-18 ENCOUNTER — Ambulatory Visit
Admission: RE | Admit: 2017-11-18 | Discharge: 2017-11-18 | Disposition: A | Discharge status: Home / Self Care | Payer: PRIVATE HEALTH INSURANCE | Source: Ambulatory Visit | Attending: Obstetrics and Gynecology | Admitting: Obstetrics and Gynecology

## 2017-11-18 DIAGNOSIS — Z1231 Encounter for screening mammogram for malignant neoplasm of breast: Secondary | ICD-10-CM | POA: Diagnosis present

## 2019-01-26 ENCOUNTER — Ambulatory Visit: Payer: PRIVATE HEALTH INSURANCE | Admitting: Obstetrics and Gynecology

## 2019-01-28 ENCOUNTER — Ambulatory Visit: Payer: PRIVATE HEALTH INSURANCE | Admitting: Obstetrics and Gynecology

## 2019-07-08 ENCOUNTER — Other Ambulatory Visit (HOSPITAL_COMMUNITY)
Admission: RE | Admit: 2019-07-08 | Discharge: 2019-07-08 | Disposition: A | Discharge status: Home / Self Care | Payer: BC Managed Care – PPO | Source: Ambulatory Visit | Attending: Obstetrics and Gynecology | Admitting: Obstetrics and Gynecology

## 2019-07-08 ENCOUNTER — Other Ambulatory Visit: Payer: Self-pay

## 2019-07-08 ENCOUNTER — Encounter: Payer: Self-pay | Admitting: Obstetrics and Gynecology

## 2019-07-08 ENCOUNTER — Ambulatory Visit (INDEPENDENT_AMBULATORY_CARE_PROVIDER_SITE_OTHER): Payer: BC Managed Care – PPO | Admitting: Obstetrics and Gynecology

## 2019-07-08 VITALS — BP 153/88 | Ht 65.0 in | Wt 174.0 lb

## 2019-07-08 DIAGNOSIS — Z1331 Encounter for screening for depression: Secondary | ICD-10-CM

## 2019-07-08 DIAGNOSIS — Z01419 Encounter for gynecological examination (general) (routine) without abnormal findings: Secondary | ICD-10-CM

## 2019-07-08 DIAGNOSIS — Z1339 Encounter for screening examination for other mental health and behavioral disorders: Secondary | ICD-10-CM

## 2019-07-08 DIAGNOSIS — Z124 Encounter for screening for malignant neoplasm of cervix: Secondary | ICD-10-CM

## 2019-07-08 NOTE — Progress Notes (Signed)
Routine Annual Gynecology Examination   PCP: [name], MD  Chief Complaint  Patient presents with  . Gynecologic Exam   History of Present Illness: Patient is a 58 y.o. G0P0000 presents for annual exam. The patient has no complaints today.   Menopausal bleeding: Her last period was 11/2018. She had a couple of spots and that was it.    Menopausal symptoms: denies  Breast symptoms: denies  Last pap smear: 06/2016 Result Normal  Last mammogram: 10/2017 Result Normal   Last Colonoscopy: age 89  Past Medical History:  Diagnosis Date  . Abnormal Pap smear of cervix   . Diabetes mellitus without complication [facility])     Past Surgical History:  Procedure Laterality Date  . ENDOMETRIAL ABLATION    . HYSTEROSCOPY W/D&C    . LEEP      Medications: Metformin   Allergies: No Known Allergies  Obstetric History: G0P0000  Social History   Socioeconomic History  . Marital status: Married    Spouse name: Not on file  . Number of children: Not on file  . Years of education: Not on file  . Highest education level: Not on file  Occupational History  . Not on file  Social Needs  . Financial resource strain: Not on file  . Food insecurity    Worry: Not on file    Inability: Not on file  . Transportation needs    Medical: Not on file    Non-medical: Not on file  Tobacco Use  . Smoking status: Never Smoker  . Smokeless tobacco: Never Used  Substance and Sexual Activity  . Alcohol use: No  . Drug use: No  . Sexual activity: Yes  Lifestyle  . Physical activity    Days per week: Not on file    Minutes per session: Not on file  . Stress: Not on file  Relationships  . Social Herbalist on phone: Not on file    Gets together: Not on file    Attends religious service: Not on file    Active member of club or organization: Not on file    Attends meetings of clubs or organizations: Not on file    Relationship status: Not on file  . Intimate partner  violence    Fear of current or ex partner: Not on file    Emotionally abused: Not on file    Physically abused: Not on file    Forced sexual activity: Not on file  Other Topics Concern  . Not on file  Social History Narrative  . Not on file    Family History  Problem Relation Age of Onset  . Bleeding Disorder Mother   . Biliary Cirrhosis Mother   . Liver cancer Mother   . Hypertension Mother   . Diabetes Father   . Lung cancer Father   . Breast cancer Neg Hx     Review of Systems  Constitutional: Negative.   HENT: Negative.   Eyes: Negative.   Respiratory: Negative.   Cardiovascular: Negative.   Gastrointestinal: Negative.   Genitourinary: Negative.   Musculoskeletal: Negative.   Skin: Negative.   Neurological: Negative.   Psychiatric/Behavioral: Negative.      Physical Exam Vitals: BP (!) 153/88   Ht 5\' 5"  (1.651 m)   Wt 174 lb (78.9 kg)   LMP 11/24/2018 (Approximate)   BMI 28.96 kg/m   Physical Exam Constitutional:      General: She is not in acute distress.  Appearance: She is well-developed.  Genitourinary:     Pelvic exam was performed with patient supine.     Uterus normal.     No signs of injury in the vagina.     No vaginal discharge, erythema, tenderness or bleeding.     No cervical motion tenderness, discharge, lesion or polyp.     Uterus is mobile.     Uterus is not enlarged or tender.     No uterine mass detected.    Uterus is anteverted.     No right or left adnexal mass present.     Right adnexa not tender or full.     Left adnexa not tender or full.  HENT:     Head: Normocephalic and atraumatic.  Eyes:     General: No scleral icterus. Neck:     Musculoskeletal: Normal range of motion and neck supple.     Thyroid: No thyromegaly.  Cardiovascular:     Rate and Rhythm: Normal rate and regular rhythm.     Heart sounds: No murmur. No friction rub. No gallop.   Pulmonary:     Effort: Pulmonary effort is normal. No respiratory distress.      Breath sounds: Normal breath sounds. No wheezing or rales.  Chest:     Breasts:        Right: No inverted nipple, mass, nipple discharge, skin change or tenderness.        Left: No inverted nipple, mass, nipple discharge, skin change or tenderness.  Abdominal:     General: Bowel sounds are normal. There is no distension.     Palpations: Abdomen is soft. There is no mass.     Tenderness: There is no abdominal tenderness. There is no guarding or rebound.  Musculoskeletal: Normal range of motion.        General: No tenderness.  Lymphadenopathy:     Cervical: No cervical adenopathy.     Lower Body: No right inguinal adenopathy. No left inguinal adenopathy.  Neurological:     Mental Status: She is alert and oriented to person, place, and time.     Cranial Nerves: No cranial nerve deficit.  Skin:    General: Skin is warm and dry.     Findings: No erythema or rash.  Psychiatric:        Behavior: Behavior normal.        Judgment: Judgment normal.     Female chaperone present for pelvic and breast  portions of the physical exam  Results: AUDIT Questionnaire (screen for alcoholism): 0 PHQ-9: 0   Assessment and Plan:  58 y.o. G0P0000 female here for routine annual gynecologic examination  Plan: Problem List Items Addressed This Visit    None    Visit Diagnoses    Women's annual routine gynecological examination    -  Primary   Relevant Orders   Cytology - PAP   Screening for depression       Screening for alcoholism       Pap smear for cervical cancer screening       Relevant Orders   Cytology - PAP      Screening: -- Blood pressure screen normal -- Colonoscopy - not due -- Mammogram - due. Patient to call [place] to arrange. She understands that it is her responsibility to arrange this. -- Weight screening: overweight: continue to monitor -- Depression screening negative (PHQ-9) -- Nutrition: normal -- cholesterol screening: per PCP -- osteoporosis screening:  not due -- tobacco screening: not using --  alcohol screening: AUDIT questionnaire indicates low-risk usage. -- family history of breast cancer screening: done. not at high risk. -- no evidence of domestic violence or intimate partner violence. -- STD screening: gonorrhea/chlamydia NAAT not collected per patient request. -- pap smear not collected per ASCCP guidelines -- flu vaccine declines -- HPV vaccination series: not eligilbe   [name], MD 07/08/2019 4:21 PM

## 2019-07-08 NOTE — Patient Instructions (Signed)
Make your mammogram appointment soon:  [facility] at [facility] [place],  [place]  [id number] Get Driving Directions Main: [phone]  Sunday:Closed Monday:8:00 AM - 5:00 PM Tuesday:7:20 AM - 5:00 PM Wednesday:7:20 AM - 5:00 PM Thursday:7:20 AM - 5:00 PM Friday:8:00 AM - 5:00 PM Saturday:Closed

## 2019-07-13 ENCOUNTER — Other Ambulatory Visit: Payer: Self-pay | Admitting: Obstetrics and Gynecology

## 2019-07-13 DIAGNOSIS — Z1231 Encounter for screening mammogram for malignant neoplasm of breast: Secondary | ICD-10-CM

## 2019-07-15 ENCOUNTER — Encounter: Payer: Self-pay | Admitting: Obstetrics and Gynecology

## 2019-07-15 LAB — CYTOLOGY - PAP
Comment: NEGATIVE
Diagnosis: NEGATIVE
High risk HPV: NEGATIVE

## 2019-09-27 ENCOUNTER — Encounter (INDEPENDENT_AMBULATORY_CARE_PROVIDER_SITE_OTHER): Payer: Self-pay

## 2019-09-27 ENCOUNTER — Other Ambulatory Visit: Payer: Self-pay

## 2019-09-27 ENCOUNTER — Ambulatory Visit
Admission: RE | Admit: 2019-09-27 | Discharge: 2019-09-27 | Disposition: A | Discharge status: Home / Self Care | Payer: BC Managed Care – PPO | Source: Ambulatory Visit | Attending: Obstetrics and Gynecology | Admitting: Obstetrics and Gynecology

## 2019-09-27 DIAGNOSIS — Z1231 Encounter for screening mammogram for malignant neoplasm of breast: Secondary | ICD-10-CM | POA: Insufficient documentation

## 2020-05-23 ENCOUNTER — Emergency Department: Payer: BC Managed Care – PPO

## 2020-05-23 ENCOUNTER — Emergency Department
Admission: EM | Admit: 2020-05-23 | Discharge: 2020-05-23 | Disposition: A | Discharge status: Home / Self Care | Payer: BC Managed Care – PPO | Attending: Emergency Medicine | Admitting: Emergency Medicine

## 2020-05-23 ENCOUNTER — Other Ambulatory Visit: Payer: Self-pay

## 2020-05-23 DIAGNOSIS — E119 Type 2 diabetes mellitus without complications: Secondary | ICD-10-CM | POA: Insufficient documentation

## 2020-05-23 DIAGNOSIS — Z20822 Contact with and (suspected) exposure to covid-19: Secondary | ICD-10-CM | POA: Insufficient documentation

## 2020-05-23 DIAGNOSIS — K8020 Calculus of gallbladder without cholecystitis without obstruction: Secondary | ICD-10-CM | POA: Insufficient documentation

## 2020-05-23 DIAGNOSIS — R1011 Right upper quadrant pain: Secondary | ICD-10-CM | POA: Diagnosis present

## 2020-05-23 DIAGNOSIS — Z7984 Long term (current) use of oral hypoglycemic drugs: Secondary | ICD-10-CM | POA: Insufficient documentation

## 2020-05-23 LAB — URINALYSIS, COMPLETE (UACMP) WITH MICROSCOPIC
Bacteria, UA: NONE SEEN
Bilirubin Urine: NEGATIVE
Glucose, UA: NEGATIVE mg/dL
Hgb urine dipstick: NEGATIVE
Ketones, ur: NEGATIVE mg/dL
Leukocytes,Ua: NEGATIVE
Nitrite: NEGATIVE
Protein, ur: NEGATIVE mg/dL
Specific Gravity, Urine: 1.024 (ref 1.005–1.030)
pH: 6 (ref 5.0–8.0)

## 2020-05-23 LAB — COMPREHENSIVE METABOLIC PANEL
ALT: 74 U/L — ABNORMAL HIGH (ref 0–44)
AST: 50 U/L — ABNORMAL HIGH (ref 15–41)
Albumin: 4.2 g/dL (ref 3.5–5.0)
Alkaline Phosphatase: 52 U/L (ref 38–126)
Anion gap: 7 (ref 5–15)
BUN: 24 mg/dL — ABNORMAL HIGH (ref 6–20)
CO2: 26 mmol/L (ref 22–32)
Calcium: 8.6 mg/dL — ABNORMAL LOW (ref 8.9–10.3)
Chloride: 106 mmol/L (ref 98–111)
Creatinine, Ser: 0.74 mg/dL (ref 0.44–1.00)
GFR calc Af Amer: >60 mL/min (ref >60)
GFR calc non Af Amer: >60 mL/min (ref >60)
Glucose, Bld: 91 mg/dL (ref 70–99)
Potassium: 3.6 mmol/L (ref 3.5–5.1)
Sodium: 139 mmol/L (ref 135–145)
Total Bilirubin: 1 mg/dL (ref 0.3–1.2)
Total Protein: 7.9 g/dL (ref 6.5–8.1)

## 2020-05-23 LAB — CBC
HCT: 35.6 % — ABNORMAL LOW (ref 36.0–46.0)
Hemoglobin: 11.3 g/dL — ABNORMAL LOW (ref 12.0–15.0)
MCH: 25.5 pg — ABNORMAL LOW (ref 26.0–34.0)
MCHC: 31.7 g/dL (ref 30.0–36.0)
MCV: 80.2 fL (ref 80.0–100.0)
Platelets: 401 10*3/uL — ABNORMAL HIGH (ref 150–400)
RBC: 4.44 MIL/uL (ref 3.87–5.11)
RDW: 16 % — ABNORMAL HIGH (ref 11.5–15.5)
WBC: 10.9 10*3/uL — ABNORMAL HIGH (ref 4.0–10.5)
nRBC: 0 % (ref 0.0–0.2)

## 2020-05-23 LAB — SARS CORONAVIRUS 2 BY RT PCR (HOSPITAL ORDER, PERFORMED IN ~~LOC~~ HOSPITAL LAB): SARS Coronavirus 2: NEGATIVE

## 2020-05-23 LAB — LIPASE, BLOOD: Lipase: 23 U/L (ref 11–51)

## 2020-05-23 MED ORDER — LACTATED RINGERS IV BOLUS
1000.0000 mL | Freq: Once | INTRAVENOUS | Status: AC
  Administered 2020-05-23: 1000 mL via INTRAVENOUS

## 2020-05-23 MED ORDER — HYDROCODONE-ACETAMINOPHEN 5-325 MG PO TABS
1.0000 | ORAL_TABLET | Freq: Four times a day (QID) | ORAL | 0 refills | Status: AC | PRN
Start: 2020-05-23 — End: 2020-05-28

## 2020-05-23 MED ORDER — FENTANYL CITRATE (PF) 100 MCG/2ML IJ SOLN
50.0000 ug | Freq: Once | INTRAMUSCULAR | Status: AC
  Administered 2020-05-23: 50 ug via INTRAVENOUS
  Filled 2020-05-23: qty 2

## 2020-05-23 NOTE — ED Provider Notes (Signed)
[facility] Provider Note  ____________________________________________   First MD Initiated Contact with Patient 05/23/20 2026     (approximate)  I have reviewed the triage vital signs and the nursing notes.   HISTORY  Chief Complaint Abdominal Pain   HPI Lauren Lindsey is a 59 y.o. female with past medical history of non-insulin-dependent DM and prior endometrial ablation who presents for assessment of acute on chronic right upper quadrant abdominal pain.  Patient states she has had intermittent right upper quadrant pain over the last 2 months but today became constant and she describes it as stabbing.  It does not radiate.  No prior similar episodes.  No clearly feeding aggravating factors including food, NSAIDs, or EtOH.  Patient denies any headache, earache, sore throat, fevers, chills, chest pain, cough, shortness of breath, nausea, vomiting, diarrhea, dysuria, left-sided and lower abdominal pain, joint pain, rash, or other acute complaints.  No other acute concerns at this time.         Past Medical History:  Diagnosis Date  . Abnormal Pap smear of cervix   . Diabetes mellitus without complication [facility])     Patient Active Problem List   Diagnosis Date Noted  . Chronic venous insufficiency 08/05/2016    Past Surgical History:  Procedure Laterality Date  . ENDOMETRIAL ABLATION    . HYSTEROSCOPY WITH D & C    . LEEP      Prior to Admission medications   Medication Sig Start Date End Date Taking? Authorizing Provider  metFORMIN (GLUCOPHAGE) 500 MG tablet Take 500 mg by mouth 2 (two) times daily with a meal. 06/21/19  Yes [provider]  HYDROcodone-acetaminophen (NORCO/VICODIN) 5-325 MG tablet Take 1 tablet by mouth every 6 (six) hours as needed for up to 5 days for severe pain. 05/23/20 05/28/20  Lucrezia Starch, MD    Allergies Patient has no known allergies.  Family History  Problem Relation Age of Onset  .  Bleeding Disorder Mother   . Biliary Cirrhosis Mother   . Liver cancer Mother   . Hypertension Mother   . Diabetes Father   . Lung cancer Father   . Breast cancer Neg Hx     Social History Social History   Tobacco Use  . Smoking status: Never Smoker  . Smokeless tobacco: Never Used  Vaping Use  . Vaping Use: Never used  Substance Use Topics  . Alcohol use: No  . Drug use: No    Review of Systems  Review of Systems  Constitutional: Negative for chills and fever.  HENT: Negative for sore throat.   Eyes: Negative for pain.  Respiratory: Negative for cough and stridor.   Cardiovascular: Negative for chest pain.  Gastrointestinal: Positive for abdominal pain. Negative for vomiting.  Skin: Negative for rash.  Neurological: Negative for seizures, loss of consciousness and headaches.  Psychiatric/Behavioral: Negative for suicidal ideas.  All other systems reviewed and are negative.     ____________________________________________   PHYSICAL EXAM:  VITAL SIGNS: ED Triage Vitals  Enc Vitals Group     BP 05/23/20 1300 (!) 155/74     Pulse Rate 05/23/20 1300 72     Resp 05/23/20 1300 18     Temp 05/23/20 1300 99.3 F (37.4 C)     Temp Source 05/23/20 1300 Oral     SpO2 05/23/20 1300 100 %     Weight 05/23/20 1300 172 lb (78 kg)     Height 05/23/20 1300 5'  5" (1.651 m)     Head Circumference --      Peak Flow --      Pain Score 05/23/20 1305 3     Pain Loc --      Pain Edu? --      Excl. in [place]? --    Vitals:   05/23/20 2100 05/23/20 2130  BP: (!) 156/126 (!) 153/76  Pulse: (!) 59 76  Resp: 13 18  Temp:    SpO2: 99% 100%   Physical Exam Vitals and nursing note reviewed.  Constitutional:      General: She is not in acute distress.    Appearance: She is well-developed.  HENT:     Head: Normocephalic and atraumatic.     Right Ear: External ear normal.     Left Ear: External ear normal.     Nose: Nose normal.     Mouth/Throat:     Mouth: Mucous membranes  are moist.  Eyes:     Conjunctiva/sclera: Conjunctivae normal.  Cardiovascular:     Rate and Rhythm: Normal rate and regular rhythm.     Pulses: Normal pulses.     Heart sounds: No murmur heard.   Pulmonary:     Effort: Pulmonary effort is normal. No respiratory distress.     Breath sounds: Normal breath sounds.  Abdominal:     Palpations: Abdomen is soft.     Tenderness: There is abdominal tenderness in the right upper quadrant. There is no right CVA tenderness, left CVA tenderness or guarding.  Musculoskeletal:     Cervical back: Neck supple.  Skin:    General: Skin is warm and dry.     Capillary Refill: Capillary refill takes less than 2 seconds.     Coloration: Skin is not jaundiced.  Neurological:     Mental Status: She is alert and oriented to person, place, and time.  Psychiatric:        Mood and Affect: Mood normal.      ____________________________________________   LABS (all labs ordered are listed, but only abnormal results are displayed)  Labs Reviewed  COMPREHENSIVE METABOLIC PANEL - Abnormal; Notable for the following components:      Result Value   BUN 24 (*)    Calcium 8.6 (*)    AST 50 (*)    ALT 74 (*)    All other components within normal limits  CBC - Abnormal; Notable for the following components:   WBC 10.9 (*)    Hemoglobin 11.3 (*)    HCT 35.6 (*)    MCH 25.5 (*)    RDW 16.0 (*)    Platelets 401 (*)    All other components within normal limits  URINALYSIS, COMPLETE (UACMP) WITH MICROSCOPIC - Abnormal; Notable for the following components:   Color, Urine YELLOW (*)    APPearance CLEAR (*)    All other components within normal limits  SARS CORONAVIRUS 2 BY RT PCR (HOSPITAL ORDER, [facility])  LIPASE, BLOOD   ____________________________________________  EKG  Sinus rhythm with a ventricular rate of 62, normal axis, normal intervals, no evidence of acute ischemia or other significant underlying  arrhythmia. ____________________________________________  RADIOLOGY  Official radiology report(s): US Abdomen Limited RUQ  Result Date: 05/23/2020 CLINICAL DATA:  Right upper quadrant pain EXAM: ULTRASOUND ABDOMEN LIMITED RIGHT UPPER QUADRANT COMPARISON:  None. FINDINGS: Gallbladder: Small echogenic adherent probable polyps are noted the largest measuring 8 mm. There is layering calcified gallstones, measuring up to 8  mm. No gallbladder wall thickening. No sonographic Murphy sign noted by sonographer. Common bile duct: Diameter: 2 mm Liver: Increased echotexture seen throughout. No focal abnormality or biliary ductal dilatation. Portal vein is patent on color Doppler imaging with normal direction of blood flow towards the liver. Other: None. IMPRESSION: Cholelithiasis and gallbladder polyps. No evidence of acute cholecystitis. Hepatic steatosis Electronically Signed   By: Prudencio Pair M.D.   On: 05/23/2020 21:43    ____________________________________________   PROCEDURES  Procedure(s) performed (including Critical Care):  Procedures   ____________________________________________   INITIAL IMPRESSION / ASSESSMENT AND PLAN / ED COURSE        Patient presents with [place] to history exam for assessment of acute and chronic right upper quadrant pain.  Patient is afebrile hemodynamically stable arrival.  Exam as above.  Overall patient's history, exam, and work-up is most consistent with symptomatic cholelithiasis.  No evidence of acute cholecystitis, choledocholithiasis, hepatitis, pyelonephritis, cystitis, or other acute pathology in the right upper quadrant on presentation work-up today.  No evidence of significant cholestasis or significant electrolyte or metabolic arrangement.  Patient is not septic and declined any additional analgesia after below noted analgesia given in the ED.  Given stable vital signs with otherwise well-appearing patient who is tolerating p.o. I believe she is stable  for discharge with plan for outpatient general surgery follow-up for possible elective cholecystectomy.  Short course of Norco written.  Discharge stable condition.  Strict return precautions advised and discussed.          ____________________________________________   FINAL CLINICAL IMPRESSION(S) / ED DIAGNOSES  Final diagnoses:  RUQ pain  Symptomatic cholelithiasis    Medications  lactated ringers bolus 1,000 mL (1,000 mLs Intravenous New Bag/Given 05/23/20 2045)  fentaNYL (SUBLIMAZE) injection 50 mcg (50 mcg Intravenous Given 05/23/20 2046)     ED Discharge Orders         Ordered    HYDROcodone-acetaminophen (NORCO/VICODIN) 5-325 MG tablet  Every 6 hours PRN        05/23/20 2152           Note:  This document was prepared using Dragon voice recognition software and may include unintentional dictation errors.   Lucrezia Starch, MD 05/23/20 2154

## 2020-05-23 NOTE — ED Triage Notes (Signed)
Pt c/o upper abd pain for the past 2 months and states today is worse. Denies N/V.Marland Kitchen

## 2020-05-25 ENCOUNTER — Ambulatory Visit: Payer: Self-pay | Admitting: Surgery

## 2020-05-25 ENCOUNTER — Other Ambulatory Visit: Payer: Self-pay

## 2020-05-25 ENCOUNTER — Encounter: Payer: Self-pay | Admitting: Surgery

## 2020-05-25 ENCOUNTER — Ambulatory Visit (INDEPENDENT_AMBULATORY_CARE_PROVIDER_SITE_OTHER): Payer: BC Managed Care – PPO | Admitting: Surgery

## 2020-05-25 VITALS — BP 153/85 | HR 86 | Temp 99.0°F | Ht 65.0 in | Wt 171.0 lb

## 2020-05-25 DIAGNOSIS — K8010 Calculus of gallbladder with chronic cholecystitis without obstruction: Secondary | ICD-10-CM

## 2020-05-25 NOTE — Patient Instructions (Signed)
You have requested to have your gallbladder removed. This will be done at [facility] with Dr. Christian Mate.  You will most likely be out of work 1-2 weeks for this surgery. You will return after your post-op appointment with a lifting restriction for approximately 4 more weeks.  You will be able to eat anything you would like to following surgery. But, start by eating a bland diet and advance this as tolerated. The Gallbladder diet is below, please go as closely by this diet as possible prior to surgery to avoid any further attacks.  Please see the (blue)pre-care form that you have been given today. If you have any questions, please call our office. Our surgery scheduler will contact you to schedule this surgery and to go over information.   Laparoscopic Cholecystectomy Laparoscopic cholecystectomy is surgery to remove the gallbladder. The gallbladder is located in the upper right part of the abdomen, behind the liver. It is a storage sac for bile, which is produced in the liver. Bile aids in the digestion and absorption of fats. Cholecystectomy is often done for inflammation of the gallbladder (cholecystitis). This condition is usually caused by a buildup of gallstones (cholelithiasis) in the gallbladder. Gallstones can block the flow of bile, and that can result in inflammation and pain. In severe cases, emergency surgery may be required. If emergency surgery is not required, you will have time to prepare for the procedure. Laparoscopic surgery is an alternative to open surgery. Laparoscopic surgery has a shorter recovery time. Your common bile duct may also need to be examined during the procedure. If stones are found in the common bile duct, they may be removed. LET [facility] CARE PROVIDER KNOW ABOUT:  Any allergies you have.  All medicines you are taking, including vitamins, herbs, eye drops, creams, and over-the-counter medicines.  Previous problems you or members of your family have  had with the use of anesthetics.  Any blood disorders you have.  Previous surgeries you have had.    Any medical conditions you have. RISKS AND COMPLICATIONS Generally, this is a safe procedure. However, problems may occur, including:  Infection.  Bleeding.  Allergic reactions to medicines.  Damage to other structures or organs.  A stone remaining in the common bile duct.  A bile leak from the cyst duct that is clipped when your gallbladder is removed.  The need to convert to open surgery, which requires a larger incision in the abdomen. This may be necessary if your surgeon thinks that it is not safe to continue with a laparoscopic procedure. BEFORE THE PROCEDURE  Ask your health care provider about:  Changing or stopping your regular medicines. This is especially important if you are taking diabetes medicines or blood thinners.  Taking medicines such as aspirin and ibuprofen. These medicines can thin your blood. Do not take these medicines before your procedure if your health care provider instructs you not to.  Follow instructions from your health care provider about eating or drinking restrictions.  Let your health care provider know if you develop a cold or an infection before surgery.  Plan to have someone take you home after the procedure.  Ask your health care provider how your surgical site will be marked or identified.  You may be given antibiotic medicine to help prevent infection. PROCEDURE  To reduce your risk of infection:  Your health care team will wash or sanitize their hands.  Your skin will be washed with soap.  An IV tube may be inserted  into one of your veins.  You will be given a medicine to make you fall asleep (general anesthetic).  A breathing tube will be placed in your mouth.  The surgeon will make several small cuts (incisions) in your abdomen.  A thin, lighted tube (laparoscope) that has a tiny camera on the end will be inserted  through one of the small incisions. The camera on the laparoscope will send a picture to a TV screen (monitor) in the operating room. This will give the surgeon a good view inside your abdomen.  A gas will be pumped into your abdomen. This will expand your abdomen to give the surgeon more room to perform the surgery.  Other tools that are needed for the procedure will be inserted through the other incisions. The gallbladder will be removed through one of the incisions.  After your gallbladder has been removed, the incisions will be closed with stitches (sutures), staples, or skin glue.  Your incisions may be covered with a bandage (dressing). The procedure may vary among health care providers and hospitals. AFTER THE PROCEDURE  Your blood pressure, heart rate, breathing rate, and blood oxygen level will be monitored often until the medicines you were given have worn off.  You will be given medicines as needed to control your pain.   This information is not intended to replace advice given to you by your health care provider. Make sure you discuss any questions you have with your health care provider.   Document Released: 09/09/2005 Document Revised: 05/31/2015 Document Reviewed: 04/21/2013 Elsevier Interactive Patient Education 2016 [place] Diet for Gallbladder Conditions A low-fat diet can be helpful if you have pancreatitis or a gallbladder condition. With these conditions, your pancreas and gallbladder have trouble digesting fats. A healthy eating plan with less fat will help rest your pancreas and gallbladder and reduce your symptoms. WHAT DO I NEED TO KNOW ABOUT THIS DIET?  Eat a low-fat diet.  Reduce your fat intake to less than 20-30% of your total daily calories. This is less than 50-60 g of fat per day.  Remember that you need some fat in your diet. Ask your dietician what your daily goal should be.  Choose nonfat and low-fat healthy foods. Look for the words  "nonfat," "low fat," or "fat free."  As a guide, look on the label and choose foods with less than 3 g of fat per serving. Eat only one serving.  Avoid alcohol.  Do not smoke. If you need help quitting, talk with your health care provider.  Eat small frequent meals instead of three large heavy meals. WHAT FOODS CAN I EAT? Grains Include healthy grains and starches such as potatoes, wheat bread, fiber-rich cereal, and brown rice. Choose whole grain options whenever possible. In adults, whole grains should account for 45-65% of your daily calories.  Fruits and Vegetables Eat plenty of fruits and vegetables. Fresh fruits and vegetables add fiber to your diet. Meats and Other Protein Sources Eat lean meat such as chicken and pork. Trim any fat off of meat before cooking it. Eggs, fish, and beans are other sources of protein. In adults, these foods should account for 10-35% of your daily calories. Dairy Choose low-fat milk and dairy options. Dairy includes fat and protein, as well as calcium.  Fats and Oils Limit high-fat foods such as fried foods, sweets, baked goods, sugary drinks.  Other Creamy sauces and condiments, such as mayonnaise, can add extra fat. Think about whether or  not you need to use them, or use smaller amounts or low fat options. WHAT FOODS ARE NOT RECOMMENDED?  High fat foods, such as:  [employer].  Ice cream.  [place] toast.  Sweet rolls.  Pizza.  Cheese bread.  Foods covered with batter, butter, creamy sauces, or cheese.  Fried foods.  Sugary drinks and desserts.  Foods that cause gas or bloating   This information is not intended to replace advice given to you by your health care provider. Make sure you discuss any questions you have with your health care provider.   Document Released: 09/14/2013 Document Reviewed: 09/14/2013 Elsevier Interactive Patient Education [employer].

## 2020-05-25 NOTE — Progress Notes (Signed)
Patient ID: Lauren Lindsey, female   DOB: 11/21/1960, 59 y.o.   MRN: [id number]  Chief Complaint: Right upper quadrant pain  History of Present Illness Lauren Lindsey is a 59 y.o. female with history of right upper quadrant pain, exacerbated by eating and exacerbated by activity at work.  She had a more recent attack, compared to though she has been having over the last couple months for which she finally presented to the emergency room.  An ultrasound was done in the ER which showed gallstones.  She denies any history of jaundice, acholic stools or scleral icterus.  She denies diarrhea, and vomiting.  Past Medical History Past Medical History:  Diagnosis Date  . Abnormal Pap smear of cervix   . Diabetes mellitus without complication [facility])       Past Surgical History:  Procedure Laterality Date  . ENDOMETRIAL ABLATION    . HYSTEROSCOPY WITH D & C    . LEEP      No Known Allergies  Current Outpatient Medications  Medication Sig Dispense Refill  . HYDROcodone-acetaminophen (NORCO/VICODIN) 5-325 MG tablet Take 1 tablet by mouth every 6 (six) hours as needed for up to 5 days for severe pain. 10 tablet 0  . metFORMIN (GLUCOPHAGE) 500 MG tablet Take 500 mg by mouth 2 (two) times daily with a meal.     No current facility-administered medications for this visit.    Family History Family History  Problem Relation Age of Onset  . Bleeding Disorder Mother   . Biliary Cirrhosis Mother   . Liver cancer Mother   . Hypertension Mother   . Diabetes Father   . Lung cancer Father   . Breast cancer Neg Hx       Social History Social History   Tobacco Use  . Smoking status: Never Smoker  . Smokeless tobacco: Never Used  Vaping Use  . Vaping Use: Never used  Substance Use Topics  . Alcohol use: No  . Drug use: No        Review of Systems  Constitutional: Negative.   HENT: Negative.   Eyes: Negative.   Respiratory: Negative.   Cardiovascular: Negative.   Gastrointestinal:  Positive for abdominal pain.  Genitourinary: Negative.   Skin: Negative.   Neurological: Negative.   Psychiatric/Behavioral: Negative.       Physical Exam Blood pressure (!) 153/85, pulse 86, temperature 99 F (37.2 C), height 5\' 5"  (1.651 m), weight 171 lb (77.6 kg), last menstrual period 11/24/2018. Last Weight  Most recent update: 05/25/2020  1:30 PM   Weight  77.6 kg (171 lb)            CONSTITUTIONAL: Well developed, and nourished, appropriately responsive and aware without distress.   EYES: Sclera non-icteric.   EARS, NOSE, MOUTH AND THROAT: Mask worn.   Hearing is intact to voice.  NECK: Trachea is midline, and there is no jugular venous distension.  LYMPH NODES:  Lymph nodes in the neck are not enlarged. RESPIRATORY:  Lungs are clear, and breath sounds are equal bilaterally. Normal respiratory effort without pathologic use of accessory muscles. CARDIOVASCULAR: Heart is regular in rate and rhythm. GI: The abdomen is soft, nontender, and nondistended.  There is an area of subjective tenderness right upper quadrant.  There were no palpable masses. I did not appreciate hepatosplenomegaly. There were normal bowel sounds. MUSCULOSKELETAL:  Symmetrical muscle tone appreciated in all four extremities.    SKIN: Skin turgor is normal. No pathologic skin lesions appreciated.  NEUROLOGIC:  Motor and sensation appear grossly normal.  Cranial nerves are grossly without defect. PSYCH:  Alert and oriented to person, place and time. Affect is appropriate for situation.  Data Reviewed I have personally reviewed what is currently available of the patient's imaging, recent labs and medical records.   Labs:  CBC Latest Ref Rng & Units 05/23/2020 01/31/2012  WBC 4.0 - 10.5 K/uL 10.9(H) 7.2  Hemoglobin 12.0 - 15.0 g/dL 11.3(L) 12.4  Hematocrit 36 - 46 % 35.6(L) 37.9  Platelets 150 - 400 K/uL 401(H) 330   CMP Latest Ref Rng & Units 05/23/2020  Glucose 70 - 99 mg/dL 91  BUN 6 - 20 mg/dL 24(H)   Creatinine 0.44 - 1.00 mg/dL 0.74  Sodium 135 - 145 mmol/L 139  Potassium 3.5 - 5.1 mmol/L 3.6  Chloride 98 - 111 mmol/L 106  CO2 22 - 32 mmol/L 26  Calcium 8.9 - 10.3 mg/dL 8.6(L)  Total Protein 6.5 - 8.1 g/dL 7.9  Total Bilirubin 0.3 - 1.2 mg/dL 1.0  Alkaline Phos 38 - 126 U/L 52  AST 15 - 41 U/L 50(H)  ALT 0 - 44 U/L 74(H)     Imaging: Radiology review:  CLINICAL DATA:  Right upper quadrant pain  EXAM: ULTRASOUND ABDOMEN LIMITED RIGHT UPPER QUADRANT  COMPARISON:  None.  FINDINGS: Gallbladder:  Small echogenic adherent probable polyps are noted the largest measuring 8 mm. There is layering calcified gallstones, measuring up to 8 mm. No gallbladder wall thickening. No sonographic Murphy sign noted by sonographer.  Common bile duct:  Diameter: 2 mm  Liver:  Increased echotexture seen throughout. No focal abnormality or biliary ductal dilatation. Portal vein is patent on color Doppler imaging with normal direction of blood flow towards the liver.  Other: None.  IMPRESSION: Cholelithiasis and gallbladder polyps. No evidence of acute cholecystitis.  Hepatic steatosis   Electronically Signed   By: Prudencio Pair M.D.   On: 05/23/2020 21:43  Within last 24 hrs: No results found.  Assessment    Chronic calculus cholecystitis. Patient Active Problem List   Diagnosis Date Noted  . Chronic venous insufficiency 08/05/2016    Plan    Robotic cholecystectomy. This patient was seen and examined, and I concur with the H&P associated with this note.  This was discussed thoroughly.  Optimal plan is for robotic cholecystectomy.  Risks and benefits have been discussed with the patient which include but are not limited to anesthesia, bleeding, infection, biliary ductal injury or stenosis, other associated unanticipated injuries affiliated with laparoscopic surgery.  I believe there is the desire to proceed, interpreter utilized as needed.  Questions  elicited and answered to satisfaction.  No guarantees ever expressed or implied.  Face-to-face time spent with the patient and accompanying care providers(if present) was 30 minutes, with more than 50% of the time spent counseling, educating, and coordinating care of the patient.      Ronny Bacon M.D., FACS 05/25/2020, 2:41 PM

## 2020-05-25 NOTE — H&P (View-Only) (Signed)
Patient ID: Lauren Lindsey, female   DOB: 1961-01-17, 59 y.o.   MRN: [id number]  Chief Complaint: Right upper quadrant pain  History of Present Illness Lauren Lindsey is a 59 y.o. female with history of right upper quadrant pain, exacerbated by eating and exacerbated by activity at work.  She had a more recent attack, compared to though she has been having over the last couple months for which she finally presented to the emergency room.  An ultrasound was done in the ER which showed gallstones.  She denies any history of jaundice, acholic stools or scleral icterus.  She denies diarrhea, and vomiting.  Past Medical History Past Medical History:  Diagnosis Date  . Abnormal Pap smear of cervix   . Diabetes mellitus without complication [facility]       Past Surgical History:  Procedure Laterality Date  . ENDOMETRIAL ABLATION    . HYSTEROSCOPY WITH D & C    . LEEP      No Known Allergies  Current Outpatient Medications  Medication Sig Dispense Refill  . HYDROcodone-acetaminophen (NORCO/VICODIN) 5-325 MG tablet Take 1 tablet by mouth every 6 (six) hours as needed for up to 5 days for severe pain. 10 tablet 0  . metFORMIN (GLUCOPHAGE) 500 MG tablet Take 500 mg by mouth 2 (two) times daily with a meal.     No current facility-administered medications for this visit.    Family History Family History  Problem Relation Age of Onset  . Bleeding Disorder Mother   . Biliary Cirrhosis Mother   . Liver cancer Mother   . Hypertension Mother   . Diabetes Father   . Lung cancer Father   . Breast cancer Neg Hx       Social History Social History   Tobacco Use  . Smoking status: Never Smoker  . Smokeless tobacco: Never Used  Vaping Use  . Vaping Use: Never used  Substance Use Topics  . Alcohol use: No  . Drug use: No        Review of Systems  Constitutional: Negative.   HENT: Negative.   Eyes: Negative.   Respiratory: Negative.   Cardiovascular: Negative.   Gastrointestinal:  Positive for abdominal pain.  Genitourinary: Negative.   Skin: Negative.   Neurological: Negative.   Psychiatric/Behavioral: Negative.       Physical Exam Blood pressure (!) 153/85, pulse 86, temperature 99 F (37.2 C), height 5\' 5"  (1.651 m), weight 171 lb (77.6 kg), last menstrual period 11/24/2018. Last Weight  Most recent update: 05/25/2020  1:30 PM   Weight  77.6 kg (171 lb)            CONSTITUTIONAL: Well developed, and nourished, appropriately responsive and aware without distress.   EYES: Sclera non-icteric.   EARS, NOSE, MOUTH AND THROAT: Mask worn.   Hearing is intact to voice.  NECK: Trachea is midline, and there is no jugular venous distension.  LYMPH NODES:  Lymph nodes in the neck are not enlarged. RESPIRATORY:  Lungs are clear, and breath sounds are equal bilaterally. Normal respiratory effort without pathologic use of accessory muscles. CARDIOVASCULAR: Heart is regular in rate and rhythm. GI: The abdomen is soft, nontender, and nondistended.  There is an area of subjective tenderness right upper quadrant.  There were no palpable masses. I did not appreciate hepatosplenomegaly. There were normal bowel sounds. MUSCULOSKELETAL:  Symmetrical muscle tone appreciated in all four extremities.    SKIN: Skin turgor is normal. No pathologic skin lesions appreciated.  NEUROLOGIC:  Motor and sensation appear grossly normal.  Cranial nerves are grossly without defect. PSYCH:  Alert and oriented to person, place and time. Affect is appropriate for situation.  Data Reviewed I have personally reviewed what is currently available of the patient's imaging, recent labs and medical records.   Labs:  CBC Latest Ref Rng & Units 05/23/2020 01/31/2012  WBC 4.0 - 10.5 K/uL 10.9(H) 7.2  Hemoglobin 12.0 - 15.0 g/dL 11.3(L) 12.4  Hematocrit 36 - 46 % 35.6(L) 37.9  Platelets 150 - 400 K/uL 401(H) 330   CMP Latest Ref Rng & Units 05/23/2020  Glucose 70 - 99 mg/dL 91  BUN 6 - 20 mg/dL 24(H)   Creatinine 0.44 - 1.00 mg/dL 0.74  Sodium 135 - 145 mmol/L 139  Potassium 3.5 - 5.1 mmol/L 3.6  Chloride 98 - 111 mmol/L 106  CO2 22 - 32 mmol/L 26  Calcium 8.9 - 10.3 mg/dL 8.6(L)  Total Protein 6.5 - 8.1 g/dL 7.9  Total Bilirubin 0.3 - 1.2 mg/dL 1.0  Alkaline Phos 38 - 126 U/L 52  AST 15 - 41 U/L 50(H)  ALT 0 - 44 U/L 74(H)     Imaging: Radiology review:  CLINICAL DATA:  Right upper quadrant pain  EXAM: ULTRASOUND ABDOMEN LIMITED RIGHT UPPER QUADRANT  COMPARISON:  None.  FINDINGS: Gallbladder:  Small echogenic adherent probable polyps are noted the largest measuring 8 mm. There is layering calcified gallstones, measuring up to 8 mm. No gallbladder wall thickening. No sonographic Murphy sign noted by sonographer.  Common bile duct:  Diameter: 2 mm  Liver:  Increased echotexture seen throughout. No focal abnormality or biliary ductal dilatation. Portal vein is patent on color Doppler imaging with normal direction of blood flow towards the liver.  Other: None.  IMPRESSION: Cholelithiasis and gallbladder polyps. No evidence of acute cholecystitis.  Hepatic steatosis   Electronically Signed   By: Prudencio Pair M.D.   On: 05/23/2020 21:43  Within last 24 hrs: No results found.  Assessment    Chronic calculus cholecystitis. Patient Active Problem List   Diagnosis Date Noted  . Chronic venous insufficiency 08/05/2016    Plan    Robotic cholecystectomy. This patient was seen and examined, and I concur with the H&P associated with this note.  This was discussed thoroughly.  Optimal plan is for robotic cholecystectomy.  Risks and benefits have been discussed with the patient which include but are not limited to anesthesia, bleeding, infection, biliary ductal injury or stenosis, other associated unanticipated injuries affiliated with laparoscopic surgery.  I believe there is the desire to proceed, interpreter utilized as needed.  Questions  elicited and answered to satisfaction.  No guarantees ever expressed or implied.  Face-to-face time spent with the patient and accompanying care providers(if present) was 30 minutes, with more than 50% of the time spent counseling, educating, and coordinating care of the patient.      Ronny Bacon M.D., FACS 05/25/2020, 2:41 PM

## 2020-05-26 ENCOUNTER — Encounter
Admission: RE | Admit: 2020-05-26 | Discharge: 2020-05-26 | Disposition: A | Discharge status: Home / Self Care | Payer: BC Managed Care – PPO | Source: Ambulatory Visit | Attending: Surgery | Admitting: Surgery

## 2020-05-26 ENCOUNTER — Telehealth: Payer: Self-pay | Admitting: Surgery

## 2020-05-26 ENCOUNTER — Other Ambulatory Visit: Payer: Self-pay

## 2020-05-26 DIAGNOSIS — Z01812 Encounter for preprocedural laboratory examination: Secondary | ICD-10-CM | POA: Insufficient documentation

## 2020-05-26 HISTORY — DX: Anemia, unspecified: D64.9

## 2020-05-26 NOTE — Telephone Encounter (Signed)
Spoke with husband, Mortimer Fries.  He and his wife are informed of Pre-Admission date/time, COVID Testing date and Surgery date.  Surgery Date: 05/31/20 Preadmission Testing Date: 05/26/20 (phone 1p-5p) Covid Testing Date: 05/30/20 - patient [facility] to [facility] between 8a-1p   Patient has been made aware to call [phone], between 1-3:00pm the day before surgery, to find out what time to arrive for surgery.

## 2020-05-26 NOTE — Patient Instructions (Signed)
Your procedure is scheduled on: 05-31-20 [facility] Report to Same Day Surgery 2nd floor medical mall [facility] Entrance-take elevator on left to 2nd floor.  Check in with surgery information desk.) To find out your arrival time please call [phone] between 1PM - 3PM on 05-30-20 TUESDAY  Remember: Instructions that are not followed completely may result in serious medical risk, up to and including death, or upon the discretion of your surgeon and anesthesiologist your surgery may need to be rescheduled.    _x___ 1. Do not eat food after midnight the night before your procedure. NO GUM OR CANDY AFTER MIDNIGHT. You may drink WATER up to 2 hours before you are scheduled to arrive at the hospital for your procedure.  Do not drink WATER within 2 hours of your scheduled arrival to the hospital.  Type 1 and type 2 diabetics should only drink water.     __x__ 2. No Alcohol for 24 hours before or after surgery.   __x__3. No Smoking or e-cigarettes for 24 prior to surgery.  Do not use any chewable tobacco products for at least 6 hour prior to surgery   ____  4. Bring all medications with you on the day of surgery if instructed.    __x__ 5. Notify your doctor if there is any change in your medical condition     (cold, fever, infections).    x___6. On the morning of surgery brush your teeth with toothpaste and water.  You may rinse your mouth with mouth wash if you wish.  Do not swallow any toothpaste or mouthwash.   Do not wear jewelry, make-up, hairpins, clips or nail polish.  Do not wear lotions, powders, or perfumes.   Do not shave 48 hours prior to surgery. Men may shave face and neck.  Do not bring valuables to the hospital.    [facility] is not responsible for any belongings or valuables.               Contacts, dentures or bridgework may not be worn into surgery.  Leave your suitcase in the car. After surgery it may be brought to your room.  For patients admitted to the hospital,  discharge time is determined by your treatment team.  _  Patients discharged the day of surgery will not be allowed to drive home.  You will need someone to drive you home and stay with you the night of your procedure.    Please read over the following fact sheets that you were given:   [facility] Preparing for Surgery   ____ TAKE THE FOLLOWING MEDICATION THE MORNING OF SURGERY WITH A SMALL SIP OF WATER. These include:  1. NONE  2.  3.  4.  5.  6.  ____Fleets enema or Magnesium Citrate as directed.   _x___ Use CHG Soap as directed on instruction sheet   ____ Use inhalers on the day of surgery and bring to hospital day of surgery  _X___ Stop Metformin 2 days prior to surgery-LAST DOSE ON 05-28-20 SUNDAY   ____ Take 1/2 of usual insulin dose the night before surgery and none on the morning surgery.   ____ Follow recommendations from Cardiologist, Pulmonologist or PCP regarding stopping Aspirin, Coumadin, Plavix ,Eliquis, Effient, or Pradaxa, and Pletal.  X____Stop Anti-inflammatories such as Advil, Aleve, Ibuprofen, Motrin, Naproxen, Naprosyn, Goodies powders or aspirin products. OK to take Tylenol OR HYDROCODONE IF NEEDED   ____ Stop supplements until after surgery.     ____ Bring C-Pap  to the hospital.

## 2020-05-30 ENCOUNTER — Other Ambulatory Visit: Payer: PRIVATE HEALTH INSURANCE

## 2020-05-30 ENCOUNTER — Other Ambulatory Visit: Payer: Self-pay

## 2020-05-30 ENCOUNTER — Other Ambulatory Visit
Admission: RE | Admit: 2020-05-30 | Discharge: 2020-05-30 | Disposition: A | Discharge status: Home / Self Care | Payer: BC Managed Care – PPO | Source: Ambulatory Visit | Attending: Surgery | Admitting: Surgery

## 2020-05-30 DIAGNOSIS — Z20822 Contact with and (suspected) exposure to covid-19: Secondary | ICD-10-CM | POA: Diagnosis not present

## 2020-05-30 DIAGNOSIS — Z01812 Encounter for preprocedural laboratory examination: Secondary | ICD-10-CM | POA: Diagnosis present

## 2020-05-30 LAB — SARS CORONAVIRUS 2 (TAT 6-24 HRS): SARS Coronavirus 2: NEGATIVE

## 2020-05-31 ENCOUNTER — Other Ambulatory Visit: Payer: Self-pay

## 2020-05-31 ENCOUNTER — Ambulatory Visit
Admission: RE | Admit: 2020-05-31 | Discharge: 2020-05-31 | Disposition: A | Discharge status: Home / Self Care | Payer: BC Managed Care – PPO | Attending: Surgery | Admitting: Surgery

## 2020-05-31 ENCOUNTER — Encounter
Admission: RE | Disposition: A | Discharge status: Home / Self Care | Payer: Self-pay | Source: Home / Self Care | Attending: Surgery

## 2020-05-31 ENCOUNTER — Ambulatory Visit: Payer: BC Managed Care – PPO | Admitting: Anesthesiology

## 2020-05-31 ENCOUNTER — Encounter: Payer: Self-pay | Admitting: Surgery

## 2020-05-31 DIAGNOSIS — C482 Malignant neoplasm of peritoneum, unspecified: Secondary | ICD-10-CM

## 2020-05-31 DIAGNOSIS — K8010 Calculus of gallbladder with chronic cholecystitis without obstruction: Secondary | ICD-10-CM | POA: Insufficient documentation

## 2020-05-31 DIAGNOSIS — Z7984 Long term (current) use of oral hypoglycemic drugs: Secondary | ICD-10-CM | POA: Diagnosis not present

## 2020-05-31 DIAGNOSIS — Z833 Family history of diabetes mellitus: Secondary | ICD-10-CM | POA: Insufficient documentation

## 2020-05-31 DIAGNOSIS — K760 Fatty (change of) liver, not elsewhere classified: Secondary | ICD-10-CM | POA: Diagnosis not present

## 2020-05-31 DIAGNOSIS — E1151 Type 2 diabetes mellitus with diabetic peripheral angiopathy without gangrene: Secondary | ICD-10-CM | POA: Insufficient documentation

## 2020-05-31 DIAGNOSIS — C221 Intrahepatic bile duct carcinoma: Secondary | ICD-10-CM | POA: Insufficient documentation

## 2020-05-31 LAB — GLUCOSE, CAPILLARY
Glucose-Capillary: 98 mg/dL (ref 70–99)
Glucose-Capillary: 121 mg/dL — ABNORMAL HIGH (ref 70–99)

## 2020-05-31 LAB — POCT PREGNANCY, URINE: Preg Test, Ur: NEGATIVE

## 2020-05-31 SURGERY — CHOLECYSTECTOMY, ROBOT-ASSISTED, LAPAROSCOPIC
Anesthesia: General

## 2020-05-31 MED ORDER — CHLORHEXIDINE GLUCONATE 0.12 % MT SOLN: 15.0000 mL | Freq: Once | OROMUCOSAL | Status: AC

## 2020-05-31 MED ORDER — ROCURONIUM BROMIDE 100 MG/10ML IV SOLN
INTRAVENOUS | Status: DC | PRN
  Administered 2020-05-31: 80 mg via INTRAVENOUS

## 2020-05-31 MED ORDER — GABAPENTIN 300 MG PO CAPS: 300.0000 mg | ORAL_CAPSULE | ORAL | Status: AC

## 2020-05-31 MED ORDER — MIDAZOLAM HCL 2 MG/2ML IJ SOLN
INTRAMUSCULAR | Status: DC | PRN
  Administered 2020-05-31: 2 mg via INTRAVENOUS

## 2020-05-31 MED ORDER — LIDOCAINE HCL (CARDIAC) PF 100 MG/5ML IV SOSY
PREFILLED_SYRINGE | INTRAVENOUS | Status: DC | PRN
  Administered 2020-05-31: 80 mg via INTRAVENOUS

## 2020-05-31 MED ORDER — FENTANYL CITRATE (PF) 100 MCG/2ML IJ SOLN
INTRAMUSCULAR | Status: AC
  Filled 2020-05-31: qty 2

## 2020-05-31 MED ORDER — LACTATED RINGERS IV SOLN: INTRAVENOUS | Status: DC | PRN

## 2020-05-31 MED ORDER — FENTANYL CITRATE (PF) 100 MCG/2ML IJ SOLN
INTRAMUSCULAR | Status: DC | PRN
Start: 2020-05-31 — End: 2020-05-31
  Administered 2020-05-31: 100 ug via INTRAVENOUS

## 2020-05-31 MED ORDER — PROMETHAZINE HCL 25 MG/ML IJ SOLN
6.2500 mg | Freq: Once | INTRAMUSCULAR | Status: AC
  Administered 2020-05-31: 6.25 mg via INTRAVENOUS

## 2020-05-31 MED ORDER — ACETAMINOPHEN 500 MG PO TABS: 1000.0000 mg | ORAL_TABLET | ORAL | Status: DC

## 2020-05-31 MED ORDER — DEXAMETHASONE SODIUM PHOSPHATE 10 MG/ML IJ SOLN
INTRAMUSCULAR | Status: DC | PRN
  Administered 2020-05-31: 10 mg via INTRAVENOUS

## 2020-05-31 MED ORDER — SODIUM CHLORIDE (PF) 0.9 % IJ SOLN
INTRAMUSCULAR | Status: AC
  Filled 2020-05-31: qty 10

## 2020-05-31 MED ORDER — FENTANYL CITRATE (PF) 100 MCG/2ML IJ SOLN: 25.0000 ug | INTRAMUSCULAR | Status: DC | PRN

## 2020-05-31 MED ORDER — CHLORHEXIDINE GLUCONATE CLOTH 2 % EX PADS: 6.0000 | MEDICATED_PAD | Freq: Once | CUTANEOUS | Status: DC

## 2020-05-31 MED ORDER — PROPOFOL 10 MG/ML IV BOLUS
INTRAVENOUS | Status: AC
  Filled 2020-05-31: qty 20

## 2020-05-31 MED ORDER — PHENYLEPHRINE HCL (PRESSORS) 10 MG/ML IV SOLN
INTRAVENOUS | Status: AC
  Filled 2020-05-31: qty 1

## 2020-05-31 MED ORDER — ONDANSETRON HCL 4 MG/2ML IJ SOLN: 4.0000 mg | Freq: Once | INTRAMUSCULAR | Status: DC | PRN

## 2020-05-31 MED ORDER — ORAL CARE MOUTH RINSE: 15.0000 mL | Freq: Once | OROMUCOSAL | Status: AC

## 2020-05-31 MED ORDER — BUPIVACAINE-EPINEPHRINE (PF) 0.25% -1:200000 IJ SOLN
INTRAMUSCULAR | Status: DC | PRN
  Administered 2020-05-31: 10 mL

## 2020-05-31 MED ORDER — INDOCYANINE GREEN 25 MG IV SOLR
1.2500 mg | Freq: Once | INTRAVENOUS | Status: AC
  Administered 2020-05-31: 1.25 mg via INTRAVENOUS
  Filled 2020-05-31: qty 0.5

## 2020-05-31 MED ORDER — DEXAMETHASONE SODIUM PHOSPHATE 10 MG/ML IJ SOLN
INTRAMUSCULAR | Status: AC
  Filled 2020-05-31: qty 1

## 2020-05-31 MED ORDER — ACETAMINOPHEN 500 MG PO TABS
ORAL_TABLET | ORAL | Status: AC
  Administered 2020-05-31: 1000 mg
  Filled 2020-05-31: qty 2

## 2020-05-31 MED ORDER — KETOROLAC TROMETHAMINE 30 MG/ML IJ SOLN
INTRAMUSCULAR | Status: AC
  Filled 2020-05-31: qty 1

## 2020-05-31 MED ORDER — ONDANSETRON HCL 4 MG/2ML IJ SOLN
INTRAMUSCULAR | Status: AC
  Filled 2020-05-31: qty 2

## 2020-05-31 MED ORDER — CHLORHEXIDINE GLUCONATE 0.12 % MT SOLN
OROMUCOSAL | Status: AC
  Administered 2020-05-31: 15 mL via OROMUCOSAL
  Filled 2020-05-31: qty 15

## 2020-05-31 MED ORDER — FAMOTIDINE 20 MG PO TABS
ORAL_TABLET | ORAL | Status: AC
  Administered 2020-05-31: 20 mg
  Filled 2020-05-31: qty 1

## 2020-05-31 MED ORDER — ONDANSETRON HCL 4 MG/2ML IJ SOLN
INTRAMUSCULAR | Status: DC | PRN
  Administered 2020-05-31: 4 mg via INTRAVENOUS

## 2020-05-31 MED ORDER — BUPIVACAINE-EPINEPHRINE (PF) 0.25% -1:200000 IJ SOLN
INTRAMUSCULAR | Status: AC
  Filled 2020-05-31: qty 30

## 2020-05-31 MED ORDER — LIDOCAINE HCL (PF) 2 % IJ SOLN
INTRAMUSCULAR | Status: AC
  Filled 2020-05-31: qty 5

## 2020-05-31 MED ORDER — OXYCODONE HCL 5 MG PO TABS: 5.0000 mg | ORAL_TABLET | Freq: Once | ORAL | Status: DC | PRN

## 2020-05-31 MED ORDER — BUPIVACAINE LIPOSOME 1.3 % IJ SUSP: 20.0000 mL | Freq: Once | INTRAMUSCULAR | Status: DC

## 2020-05-31 MED ORDER — MIDAZOLAM HCL 2 MG/2ML IJ SOLN
INTRAMUSCULAR | Status: AC
  Filled 2020-05-31: qty 2

## 2020-05-31 MED ORDER — CEFAZOLIN SODIUM-DEXTROSE 2-4 GM/100ML-% IV SOLN
INTRAVENOUS | Status: AC
  Filled 2020-05-31: qty 100

## 2020-05-31 MED ORDER — PROPOFOL 10 MG/ML IV BOLUS
INTRAVENOUS | Status: DC | PRN
  Administered 2020-05-31: 100 mg via INTRAVENOUS

## 2020-05-31 MED ORDER — CEFAZOLIN SODIUM-DEXTROSE 2-4 GM/100ML-% IV SOLN
2.0000 g | INTRAVENOUS | Status: AC
  Administered 2020-05-31: 2 g via INTRAVENOUS

## 2020-05-31 MED ORDER — ACETAMINOPHEN 10 MG/ML IV SOLN
INTRAVENOUS | Status: AC
  Filled 2020-05-31: qty 100

## 2020-05-31 MED ORDER — SUGAMMADEX SODIUM 200 MG/2ML IV SOLN
INTRAVENOUS | Status: DC | PRN
  Administered 2020-05-31: 200 mg via INTRAVENOUS

## 2020-05-31 MED ORDER — OXYCODONE HCL 5 MG/5ML PO SOLN: 5.0000 mg | Freq: Once | ORAL | Status: DC | PRN

## 2020-05-31 MED ORDER — CELECOXIB 200 MG PO CAPS: 200.0000 mg | ORAL_CAPSULE | ORAL | Status: AC

## 2020-05-31 MED ORDER — SEVOFLURANE IN SOLN
RESPIRATORY_TRACT | Status: AC
  Filled 2020-05-31: qty 250

## 2020-05-31 MED ORDER — PHENYLEPHRINE HCL (PRESSORS) 10 MG/ML IV SOLN
INTRAVENOUS | Status: DC | PRN
  Administered 2020-05-31: 200 ug via INTRAVENOUS
  Administered 2020-05-31: 100 ug via INTRAVENOUS

## 2020-05-31 MED ORDER — GABAPENTIN 300 MG PO CAPS
ORAL_CAPSULE | ORAL | Status: AC
  Administered 2020-05-31: 300 mg via ORAL
  Filled 2020-05-31: qty 1

## 2020-05-31 MED ORDER — HYDROCODONE-ACETAMINOPHEN 5-325 MG PO TABS: 1.0000 | ORAL_TABLET | Freq: Four times a day (QID) | ORAL | 0 refills | Status: DC | PRN

## 2020-05-31 MED ORDER — BUPIVACAINE LIPOSOME 1.3 % IJ SUSP
INTRAMUSCULAR | Status: AC
  Filled 2020-05-31: qty 20

## 2020-05-31 MED ORDER — CELECOXIB 200 MG PO CAPS
ORAL_CAPSULE | ORAL | Status: AC
  Administered 2020-05-31: 200 mg via ORAL
  Filled 2020-05-31: qty 1

## 2020-05-31 MED ORDER — SODIUM CHLORIDE 0.9 % IV SOLN: INTRAVENOUS | Status: DC

## 2020-05-31 SURGICAL SUPPLY — 47 items
ADH SKN CLS APL DERMABOND .7 (GAUZE/BANDAGES/DRESSINGS) ×2
APL PRP STRL LF DISP 70% ISPRP (MISCELLANEOUS) ×2
BAG SPEC RTRVL LRG 6X4 10 (ENDOMECHANICALS) ×2
CANISTER SUCT 1200ML W/VALVE (MISCELLANEOUS) ×4 IMPLANT
CHLORAPREP W/TINT 26 (MISCELLANEOUS) ×4 IMPLANT
CLIP VESOLOCK MED LG 6/CT (CLIP) ×4 IMPLANT
COVER TIP SHEARS 8 DVNC (MISCELLANEOUS) ×2 IMPLANT
COVER TIP SHEARS 8MM DA VINCI (MISCELLANEOUS) ×2
COVER WAND RF STERILE (DRAPES) ×4 IMPLANT
DECANTER SPIKE VIAL GLASS SM (MISCELLANEOUS) ×4 IMPLANT
DEFOGGER SCOPE WARMER CLEARIFY (MISCELLANEOUS) ×4 IMPLANT
DERMABOND ADVANCED (GAUZE/BANDAGES/DRESSINGS) ×2
DERMABOND ADVANCED .7 DNX12 (GAUZE/BANDAGES/DRESSINGS) ×2 IMPLANT
DRAPE ARM DVNC X/XI (DISPOSABLE) ×8 IMPLANT
DRAPE COLUMN DVNC XI (DISPOSABLE) ×2 IMPLANT
DRAPE DA VINCI XI ARM (DISPOSABLE) ×8
DRAPE DA VINCI XI COLUMN (DISPOSABLE) ×2
ELECT CAUTERY BLADE 6.4 (BLADE) ×4 IMPLANT
GLOVE ORTHO TXT STRL SZ7.5 (GLOVE) ×24 IMPLANT
GOWN STRL REUS W/ TWL LRG LVL3 (GOWN DISPOSABLE) ×8 IMPLANT
GOWN STRL REUS W/TWL LRG LVL3 (GOWN DISPOSABLE) ×16
GRASPER SUT TROCAR 14GX15 (MISCELLANEOUS) IMPLANT
INFUSOR MANOMETER BAG 3000ML (MISCELLANEOUS) IMPLANT
IRRIGATION STRYKERFLOW (MISCELLANEOUS) IMPLANT
IRRIGATOR STRYKERFLOW (MISCELLANEOUS)
IRRIGATOR SUCT 8 DISP DVNC XI (IRRIGATION / IRRIGATOR) IMPLANT
IRRIGATOR SUCTION 8MM XI DISP (IRRIGATION / IRRIGATOR)
IV NS IRRIG 3000ML ARTHROMATIC (IV SOLUTION) IMPLANT
KIT PINK PAD W/HEAD ARE REST (MISCELLANEOUS) ×4
KIT PINK PAD W/HEAD ARM REST (MISCELLANEOUS) ×2 IMPLANT
KIT TURNOVER KIT A (KITS) ×4 IMPLANT
LABEL OR SOLS (LABEL) ×4 IMPLANT
NEEDLE HYPO 22GX1.5 SAFETY (NEEDLE) ×4 IMPLANT
NEEDLE INSUFFLATION 14GA 120MM (NEEDLE) IMPLANT
NS IRRIG 500ML POUR BTL (IV SOLUTION) ×4 IMPLANT
PACK LAP CHOLECYSTECTOMY (MISCELLANEOUS) ×4 IMPLANT
PENCIL ELECTRO HAND CTR (MISCELLANEOUS) ×4 IMPLANT
POUCH SPECIMEN RETRIEVAL 10MM (ENDOMECHANICALS) ×4 IMPLANT
SEAL CANN UNIV 5-8 DVNC XI (MISCELLANEOUS) ×8 IMPLANT
SEAL XI 5MM-8MM UNIVERSAL (MISCELLANEOUS) ×8
SET TUBE SMOKE EVAC HIGH FLOW (TUBING) ×4 IMPLANT
SOLUTION ELECTROLUBE (MISCELLANEOUS) ×4 IMPLANT
SUT MNCRL 4-0 (SUTURE) ×4
SUT MNCRL 4-0 27XMFL (SUTURE) ×2
SUT VICRYL 0 AB UR-6 (SUTURE) ×4 IMPLANT
SUTURE MNCRL 4-0 27XMF (SUTURE) ×2 IMPLANT
TROCAR Z-THREAD FIOS 11X100 BL (TROCAR) ×4 IMPLANT

## 2020-05-31 NOTE — Interval H&P Note (Signed)
History and Physical Interval Note:  05/31/2020 8:22 AM  Lauren Lindsey  has presented today for surgery, with the diagnosis of Chronic calculous cholecystitis.  The various methods of treatment have been discussed with the patient and family. After consideration of risks, benefits and other options for treatment, the patient has consented to  Procedure(s): XI ROBOTIC Linganore (N/A) as a surgical intervention.  The patient's history has been reviewed, patient examined, no change in status, stable for surgery.  I have reviewed the patient's chart and labs.  Questions were answered to the patient's satisfaction.     Ronny Bacon

## 2020-05-31 NOTE — Transfer of Care (Signed)
Immediate Anesthesia Transfer of Care Note  Patient: CORDIA MIKLOS  Procedure(s) Performed: XI ROBOTIC ASSISTED LAPAROSCOPIC CHOLECYSTECTOMY (N/A ) INDOCYANINE GREEN FLUORESCENCE IMAGING (ICG)  Patient Location: PACU  Anesthesia Type:General  Level of Consciousness: sedated and drowsy  Airway & Oxygen Therapy: Patient Spontanous Breathing and Patient connected to face mask oxygen  Post-op Assessment: Report given to RN and Post -op Vital signs reviewed and stable  Post vital signs: Reviewed and stable  Last Vitals:  Vitals Value Taken Time  BP 145/71 05/31/20 1017  Temp 36.3 C 05/31/20 1016  Pulse 68 05/31/20 1021  Resp 17 05/31/20 1021  SpO2 100 % 05/31/20 1021  Vitals shown include unvalidated device data.  Last Pain:  Vitals:   05/31/20 1016  TempSrc:   PainSc: Asleep      Patients Stated Pain Goal: 0 (50/75/73 2256)  Complications: No complications documented.

## 2020-05-31 NOTE — Op Note (Signed)
Robotic cholecystectomy  Pre-operative Diagnosis: Chronic calculus cholecystitis, gallbladder polyps  Post-operative Diagnosis:  Same.  With peritoneal implants consistent with carcinoma, suspicious hepatic flexure mass.  Procedure: Robotic assisted laparoscopic cholecystectomy.  Surgeon: Ronny Bacon, M.D., FACS  Anesthesia: General. with endotracheal tube  Findings: See photos below, implants limited to right upper quadrant, adjacent to region of perceived hepatic flexure mass.  External implant appears to be on adjacent liver.  No other hepatic masses appreciated.  Estimated Blood Loss: 10 mL         Drains: None         Specimens: Gallbladder           Complications: none  Procedure Details  The patient was seen again in the Holding Room.  2.5 mg dose of ICG was administered intravenously.  The benefits, complications, treatment options, risks and expected outcomes were discussed with the patient. The likelihood of improving the patient's symptoms with return to their baseline status is good.  The patient and/or family concurred with the proposed plan, giving informed consent, again alternatives reviewed.  The patient was taken to Operating Room, identified, and the procedure verified as robotic assisted laparoscopic cholecystectomy.  Prior to the induction of general anesthesia, antibiotic prophylaxis was administered. VTE prophylaxis was in place. General endotracheal anesthesia was then administered and tolerated well. The patient was positioned in the supine position.  After the induction, the abdomen was prepped with Chloraprep and draped in the sterile fashion.  A Time Out was held and the above information confirmed.  Right infra-umbilical local infiltration with quarter percent Marcaine with epinephrine is utilized.  Made a 12 mm incision on the left periumbilical site, I advanced an optical 31mm port under direct visualization into the peritoneal cavity.  Once the  peritoneum was penetrated, insufflation was transferred.  The trocar was then advanced into the abdominal cavity under direct visualization. Pneumoperitoneum was then continued with CO2 at 14 mmHg or less and tolerated well without any adverse changes in the patient's vital signs.  Two 8.5-mm ports were placed in the left lower quadrant and laterally, and one to the right lower quadrant, all under direct vision. All skin incisions  were infiltrated with a local anesthetic agent before making the incision and placing the trocars.  The patient was positioned  in reverse Trendelenburg, tilted the patient's left side down.  Da Vinci XI robot was then positioned on to the patient's left side, and docked. There was a mildly adhesed mass to the anterior abdominal wall, peritoneal implants as noted in the pictures noted below.  I biopsied one of the peritoneal implants and sent for frozen section.  Was confirmed as carcinoma. Hepatic flexure mass appears to be adherent to the adjacent liver.  To the patient's right of the gallbladder. The gallbladder was identified, the fundus grasped via the arm 4 Prograsp and retracted cephalad. Adhesions were lysed with scissors and cautery. The infundibulum was identified grasped and retracted laterally, exposing the peritoneum overlying the triangle of Calot. This was then opened and dissected using cautery & scissors. An extended critical view of the cystic duct and cystic artery was obtained, aided by the ICG via FireFly which enabled ready visualization of the ductal anatomy.    The cystic duct was clearly identified and dissected to isolation.   Artery well isolated and clipped, and the cystic duct/artery were triple clipped and divided with scissors, leaving two on the remaining stumps.   The gallbladder was taken from the gallbladder fossa in  a retrograde fashion with the electrocautery.  At this time the report came back from the frozen section, I felt it prudent to  obtain an additional specimen for permanent section this was removed from the right abdominal wall with the force bipolar.  Hemostasis maintained. The gallbladder was removed and along with the peritoneal wall implant, placed in an Endocatch bag.  The liver bed was inspected. Hemostasis was confirmed.  The robot was undocked and moved away from the operative field. The gallbladder and Endocatch sac were then removed through the infraumbilical port site.   Inspection of the right upper quadrant was performed. No bleeding, bile duct injury or leak, or bowel injury was noted. The infra-umbilical port site fascia was closed with interrumpted 0 Vicryl sutures using PMI/cone under direct visualization. Pneumoperitoneum was released and ports removed.  4-0 subcuticular Monocryl was used to close the skin. Dermabond was  applied.  The patient was then extubated and brought to the recovery room in stable condition. Sponge, lap, and needle counts were correct at closure and at the conclusion of the case.                 Ronny Bacon, M.D., [facility] 05/31/2020 10:36 AM

## 2020-05-31 NOTE — Discharge Instructions (Signed)

## 2020-05-31 NOTE — Anesthesia Procedure Notes (Signed)
Procedure Name: Intubation °Performed by: Jameica Couts R, CRNA °Pre-anesthesia Checklist: Patient identified, Emergency Drugs available, Suction available and Patient being monitored °Patient Re-evaluated:Patient Re-evaluated prior to induction °Oxygen Delivery Method: Circle system utilized °Preoxygenation: Pre-oxygenation with 100% oxygen °Induction Type: IV induction °Ventilation: Mask ventilation without difficulty and Oral airway inserted - appropriate to patient size °Laryngoscope Size: Mac and 3 °Grade View: Grade II °Tube type: Oral °Tube size: 7.5 mm °Number of attempts: 1 °Airway Equipment and Method: Oral airway °Placement Confirmation: ETT inserted through vocal cords under direct vision,  positive ETCO2 and breath sounds checked- equal and bilateral °Secured at: 21 cm °Tube secured with: Tape °Dental Injury: Teeth and Oropharynx as per pre-operative assessment  ° ° ° ° ° ° °

## 2020-05-31 NOTE — Anesthesia Procedure Notes (Signed)
Anesthesia Procedure Note     

## 2020-05-31 NOTE — Anesthesia Preprocedure Evaluation (Addendum)
Anesthesia Evaluation  Patient identified by MRN, date of birth, ID band Patient awake    Reviewed: Allergy & Precautions, H&P , NPO status , Patient's Chart, lab work & pertinent test results  History of Anesthesia Complications Negative for: history of anesthetic complications  Airway Mallampati: I  TM Distance: >3 FB Neck ROM: full    Dental  (+) Teeth Intact   Pulmonary neg pulmonary ROS, neg sleep apnea, neg COPD, neg recent URI,    breath sounds clear to auscultation       Cardiovascular (-) angina(-) Past MI and (-) Cardiac Stents negative cardio ROS  (-) dysrhythmias  Rhythm:regular Rate:Normal     Neuro/Psych negative neurological ROS  negative psych ROS   GI/Hepatic negative GI ROS, Neg liver ROS,   Endo/Other  diabetes  Renal/GU      Musculoskeletal   Abdominal   Peds  Hematology negative hematology ROS (+)   Anesthesia Other Findings Past Medical History: No date: Abnormal Pap smear of cervix No date: Anemia     Comment:  h/o No date: Diabetes mellitus without complication (HCC)  Past Surgical History: No date: ENDOMETRIAL ABLATION No date: HYSTEROSCOPY WITH D & C No date: LEEP  BMI    Body Mass Index: 28.47 kg/m      Reproductive/Obstetrics negative OB ROS                            Anesthesia Physical Anesthesia Plan  ASA: II  Anesthesia Plan: General ETT   Post-op Pain Management:    Induction:   PONV Risk Score and Plan: Ondansetron, Dexamethasone and Treatment may vary due to age or medical condition  Airway Management Planned:   Additional Equipment:   Intra-op Plan:   Post-operative Plan:   Informed Consent: I have reviewed the patients History and Physical, chart, labs and discussed the procedure including the risks, benefits and alternatives for the proposed anesthesia with the patient or authorized representative who has indicated his/her  understanding and acceptance.     Dental Advisory Given  Plan Discussed with: Anesthesiologist, CRNA and Surgeon  Anesthesia Plan Comments:         Anesthesia Quick Evaluation

## 2020-06-01 NOTE — Anesthesia Postprocedure Evaluation (Signed)
Anesthesia Post Note  Patient: Lauren Lindsey  Procedure(s) Performed: XI ROBOTIC ASSISTED LAPAROSCOPIC CHOLECYSTECTOMY (N/A ) INDOCYANINE GREEN FLUORESCENCE IMAGING (ICG)  Patient location during evaluation: PACU Anesthesia Type: General Level of consciousness: awake and alert Pain management: pain level controlled Vital Signs Assessment: post-procedure vital signs reviewed and stable Respiratory status: spontaneous breathing, nonlabored ventilation and respiratory function stable Cardiovascular status: blood pressure returned to baseline and stable Postop Assessment: no apparent nausea or vomiting Anesthetic complications: no   No complications documented.   Last Vitals:  Vitals:   05/31/20 1122 05/31/20 1147  BP: (!) 133/52 (!) 123/55  Pulse: 68 63  Resp: 18 18  Temp:    SpO2: 100% 100%    Last Pain:  Vitals:   06/01/20 0820  TempSrc:   PainSc: [place]

## 2020-06-05 ENCOUNTER — Telehealth: Payer: Self-pay | Admitting: *Deleted

## 2020-06-05 NOTE — Telephone Encounter (Signed)
Faxed FMLA to [employer] at [phone] and Return to work authorization to [employer] [phone]

## 2020-06-06 ENCOUNTER — Encounter: Payer: Self-pay | Admitting: Gastroenterology

## 2020-06-06 ENCOUNTER — Ambulatory Visit (INDEPENDENT_AMBULATORY_CARE_PROVIDER_SITE_OTHER): Payer: BC Managed Care – PPO | Admitting: Gastroenterology

## 2020-06-06 ENCOUNTER — Other Ambulatory Visit: Payer: Self-pay

## 2020-06-06 VITALS — BP 133/81 | HR 76 | Temp 98.1°F | Ht 65.0 in | Wt 163.4 lb

## 2020-06-06 DIAGNOSIS — C786 Secondary malignant neoplasm of retroperitoneum and peritoneum: Secondary | ICD-10-CM

## 2020-06-06 DIAGNOSIS — K6389 Other specified diseases of intestine: Secondary | ICD-10-CM

## 2020-06-06 DIAGNOSIS — C801 Malignant (primary) neoplasm, unspecified: Secondary | ICD-10-CM | POA: Diagnosis not present

## 2020-06-06 MED ORDER — NA SULFATE-K SULFATE-MG SULF 17.5-3.13-1.6 GM/177ML PO SOLN: 354.0000 mL | Freq: Once | ORAL | 0 refills | Status: AC

## 2020-06-06 NOTE — Patient Instructions (Addendum)
Your CT scan of abdominal is scheduled for 06/13/2020 arrived 9:15am. Nothing to eat or drink 4 hours before scan. Please pick up prep before the scan

## 2020-06-06 NOTE — Progress Notes (Signed)
Lauren Darby, MD [address]  [place]  [place], [place] [address]  Main: [phone]  Fax: [phone]    Gastroenterology Consultation  Referring Provider:     [name], MD Primary Care Physician:  Lauren Hartigan, MD Primary Gastroenterologist:  Dr. Cephas Lindsey Reason for Consultation:     Possible hepatic flexure mass and peritoneal implants during laparoscopic cholecystectomy        HPI:   Lauren Lindsey is a 59 y.o. female referred by Dr. Ellison Hughs Lauren Noa, MD  for consultation & management of abnormal Intra-Op findings.  Patient was originally seen by Dr. Ronny Lindsey on 9-21 after an ER visit for follow-up of right upper quadrant pain, elevated transaminases and cholelithiasis.  Subsequently, patient underwent robotic assisted cholecystectomy on 05/31/2020.  Intraoperatively, she was found to have peritoneal implants frozen section confirmed carcinoma and also there was also a hepatic flexure mass appears to be adherent to the adjacent liver.  Patient is waiting for the final surgical pathology results.  She is referred to me for colonoscopy Patient denies change in bowel habits, rectal bleeding, weight loss, abdominal pain other than at the sites from recent surgery.   NSAIDs: None  Antiplts/Anticoagulants/Anti thrombotics: None  GI Procedures: Patient reports having a colonoscopy at age 18, reportedly normal She denies family history of GI malignancy  Past Medical History:  Diagnosis Date  . Abnormal Pap smear of cervix   . Anemia    h/o  . Diabetes mellitus without complication [facility])     Past Surgical History:  Procedure Laterality Date  . ENDOMETRIAL ABLATION    . HYSTEROSCOPY WITH D & C    . LEEP      Prior to Admission medications   Medication Sig Start Date End Date Taking? Authorizing Provider  metFORMIN (GLUCOPHAGE) 500 MG tablet Take 500 mg by mouth 2 (two) times daily with a meal.  06/21/19  Yes [provider]  Na  Sulfate-K Sulfate-Mg Sulf 17.5-3.13-1.6 GM/177ML SOLN Take 354 mLs by mouth once for 1 dose. 06/06/20 06/06/20  Lin Landsman, MD   Current Outpatient Medications:  .  metFORMIN (GLUCOPHAGE) 500 MG tablet, Take 500 mg by mouth 2 (two) times daily with a meal. , Disp: , Rfl:  .  Na Sulfate-K Sulfate-Mg Sulf 17.5-3.13-1.6 GM/177ML SOLN, Take 354 mLs by mouth once for 1 dose., Disp: 354 mL, Rfl: 0   Family History  Problem Relation Age of Onset  . Bleeding Disorder Mother   . Biliary Cirrhosis Mother   . Liver cancer Mother   . Hypertension Mother   . Diabetes Father   . Lung cancer Father   . Breast cancer Neg Hx      Social History   Tobacco Use  . Smoking status: Never Smoker  . Smokeless tobacco: Never Used  Vaping Use  . Vaping Use: Never used  Substance Use Topics  . Alcohol use: No  . Drug use: No    Allergies as of 06/06/2020  . (No Known Allergies)    Review of Systems:    All systems reviewed and negative except where noted in HPI.   Physical Exam:  BP 133/81 (BP Location: Left Arm, Patient Position: Sitting, Cuff Size: Normal)   Pulse 76   Temp 98.1 F (36.7 C) (Oral)   Ht 5\' 5"  (1.651 m)   Wt 163 lb 6 oz (74.1 kg)   LMP 06/26/2019   BMI 27.19 kg/m  Patient's last menstrual  period was 06/26/2019.  General:   Alert,  Well-developed, well-nourished, pleasant and cooperative in NAD Head:  Normocephalic and atraumatic. Eyes:  Sclera clear, no icterus.   Conjunctiva pink. Ears:  Normal auditory acuity. Nose:  No deformity, discharge, or lesions. Mouth:  No deformity or lesions,oropharynx pink & moist. Neck:  Supple; no masses or thyromegaly. Lungs:  Respirations even and unlabored.  Clear throughout to auscultation.   No wheezes, crackles, or rhonchi. No acute distress. Heart:  Regular rate and rhythm; no murmurs, clicks, rubs, or gallops. Abdomen:  Normal bowel sounds. Soft, non-tender and non-distended without masses, hepatosplenomegaly or hernias  noted. 3 incision sites from recent surgery, appeared to be healing well, no guarding or rebound tenderness.   Rectal: Not performed Msk:  Symmetrical without gross deformities. Good, equal movement & strength bilaterally. Pulses:  Normal pulses noted. Extremities:  No clubbing or edema.  No cyanosis. Neurologic:  Alert and oriented x3;  grossly normal neurologically. Skin:  Intact without significant lesions or rashes. No jaundice. Psych:  Alert and cooperative. Normal mood and affect.  Imaging Studies: Reviewed  Assessment and Plan:   Lauren Lindsey is a 60 y.o. female with no past medical history other than diabetes, who underwent recent robotic assisted cholecystectomy for symptomatic cholelithiasis who incidentally was found to have peritoneal implants and hepatic flexure mass intraoperatively.  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  The patient agrees with this plan & written consent will be obtained.    Follow up based on above work-up   Lauren Darby, MD

## 2020-06-08 ENCOUNTER — Ambulatory Visit (INDEPENDENT_AMBULATORY_CARE_PROVIDER_SITE_OTHER): Payer: BC Managed Care – PPO | Admitting: Surgery

## 2020-06-08 ENCOUNTER — Encounter: Payer: Self-pay | Admitting: Surgery

## 2020-06-08 ENCOUNTER — Other Ambulatory Visit: Payer: Self-pay

## 2020-06-08 VITALS — BP 112/72 | HR 77 | Temp 97.9°F | Ht 65.0 in | Wt 163.0 lb

## 2020-06-08 DIAGNOSIS — Z9049 Acquired absence of other specified parts of digestive tract: Secondary | ICD-10-CM

## 2020-06-08 DIAGNOSIS — C786 Secondary malignant neoplasm of retroperitoneum and peritoneum: Secondary | ICD-10-CM

## 2020-06-08 DIAGNOSIS — C801 Malignant (primary) neoplasm, unspecified: Secondary | ICD-10-CM

## 2020-06-08 NOTE — Patient Instructions (Addendum)
As soon as we get your pathology results back we will call you with them.  We will see you after you have your colonoscopy and CT scan.  Due to possible extended treatments we will have you off from work for the next 8 weeks.

## 2020-06-08 NOTE — Progress Notes (Signed)
[facility] POST-OP OFFICE VISIT  06/08/2020  HPI: Lauren Lindsey is a 59 y.o. female 8 days s/p robotic cholecystectomy with biopsy of peritoneal implants.  Frozen section analysis confirmed a carcinoma.  Recent update from pathology shows the same, they have had little opportunity to narrow the spectrum, and are referring this pathology to [facility].  She currently has seen GI and is scheduled for colonoscopy on the 23rd, she will also be having CT scan evaluation as well.  From her cholecystectomy she has no complaints aside from some persisting subcostal/costal pain on the right flank which is consistent with proximity to this mass and the sites of the peritoneal implant biopsies.  Vital signs: BP 112/72    Pulse 77    Temp 97.9 F (36.6 C)    Ht 5\' 5"  (1.651 m)    Wt 163 lb (73.9 kg)    LMP 06/26/2019    SpO2 97%    BMI 27.12 kg/m    Physical Exam: Constitutional: She appears well.  She is in no distress whatsoever.  She is quite calm in the face of her new diagnosis of carcinoma. Abdomen: Benign, nontender. Skin: Incisions are clean, dry and intact.  Assessment/Plan: This is a 59 y.o. 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  Patient Active Problem List   Diagnosis Date Noted   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   Chronic venous insufficiency 08/05/2016   Diabetes mellitus type 2, uncomplicated ([place]) 03/88/8280    -We discussed her current work-up, likely keeping her out of work for another 8 weeks pending further evaluation and plan for treatment.  I am plan to see her back on September 28, hopefully knowing much more than we do now.   Ronny Bacon M.D., FACS 06/08/2020, 11:06 AM

## 2020-06-12 ENCOUNTER — Encounter: Payer: Self-pay | Admitting: Gastroenterology

## 2020-06-12 ENCOUNTER — Other Ambulatory Visit: Payer: Self-pay

## 2020-06-13 ENCOUNTER — Telehealth: Payer: Self-pay | Admitting: *Deleted

## 2020-06-13 ENCOUNTER — Ambulatory Visit: Admission: RE | Admit: 2020-06-13 | Payer: PRIVATE HEALTH INSURANCE | Source: Ambulatory Visit

## 2020-06-13 ENCOUNTER — Other Ambulatory Visit
Admission: RE | Admit: 2020-06-13 | Discharge: 2020-06-13 | Disposition: A | Discharge status: Home / Self Care | Payer: BC Managed Care – PPO | Source: Ambulatory Visit | Attending: Gastroenterology | Admitting: Gastroenterology

## 2020-06-13 DIAGNOSIS — Z01812 Encounter for preprocedural laboratory examination: Secondary | ICD-10-CM | POA: Diagnosis present

## 2020-06-13 DIAGNOSIS — Z20822 Contact with and (suspected) exposure to covid-19: Secondary | ICD-10-CM | POA: Insufficient documentation

## 2020-06-13 NOTE — Telephone Encounter (Signed)
Faxed FMLA to [employer] at [phone] and Faxed to [employer] at [phone]

## 2020-06-14 LAB — SARS CORONAVIRUS 2 (TAT 6-24 HRS): SARS Coronavirus 2: NEGATIVE

## 2020-06-14 NOTE — Discharge Instructions (Signed)
General Anesthesia, Adult, Care After This sheet gives you information about how to care for yourself after your procedure. Your health care provider may also give you more specific instructions. If you have problems or questions, contact your health care provider. What can I expect after the procedure? After the procedure, the following side effects are common:  Pain or discomfort at the IV site.  Nausea.  Vomiting.  Sore throat.  Trouble concentrating.  Feeling cold or chills.  Weak or tired.  Sleepiness and fatigue.  Soreness and body aches. These side effects can affect parts of the body that were not involved in surgery. Follow these instructions at home:  For at least 24 hours after the procedure:  Have a responsible adult stay with you. It is important to have someone help care for you until you are awake and alert.  Rest as needed.  Do not: ? Participate in activities in which you could fall or become injured. ? Drive. ? Use heavy machinery. ? Drink alcohol. ? Take sleeping pills or medicines that cause drowsiness. ? Make important decisions or sign legal documents. ? Take care of children on your own. Eating and drinking  Follow any instructions from your health care provider about eating or drinking restrictions.  When you feel hungry, start by eating small amounts of foods that are soft and easy to digest (bland), such as toast. Gradually return to your regular diet.  Drink enough fluid to keep your urine pale yellow.  If you vomit, rehydrate by drinking water, juice, or clear broth. General instructions  If you have sleep apnea, surgery and certain medicines can increase your risk for breathing problems. Follow instructions from your health care provider about wearing your sleep device: ? Anytime you are sleeping, including during daytime naps. ? While taking prescription pain medicines, sleeping medicines, or medicines that make you drowsy.  Return to  your normal activities as told by your health care provider. Ask your health care provider what activities are safe for you.  Take over-the-counter and prescription medicines only as told by your health care provider.  If you smoke, do not smoke without supervision.  Keep all follow-up visits as told by your health care provider. This is important. Contact a health care provider if:  You have nausea or vomiting that does not get better with medicine.  You cannot eat or drink without vomiting.  You have pain that does not get better with medicine.  You are unable to pass urine.  You develop a skin rash.  You have a fever.  You have redness around your IV site that gets worse. Get help right away if:  You have difficulty breathing.  You have chest pain.  You have blood in your urine or stool, or you vomit blood. Summary  After the procedure, it is common to have a sore throat or nausea. It is also common to feel tired.  Have a responsible adult stay with you for the first 24 hours after general anesthesia. It is important to have someone help care for you until you are awake and alert.  When you feel hungry, start by eating small amounts of foods that are soft and easy to digest (bland), such as toast. Gradually return to your regular diet.  Drink enough fluid to keep your urine pale yellow.  Return to your normal activities as told by your health care provider. Ask your health care provider what activities are safe for you. This information is not   intended to replace advice given to you by your health care provider. Make sure you discuss any questions you have with your health care provider. Document Revised: 09/12/2017 Document Reviewed: 04/25/2017 Elsevier Patient Education  2020 Elsevier Inc.  

## 2020-06-15 ENCOUNTER — Ambulatory Visit: Payer: BC Managed Care – PPO | Admitting: Anesthesiology

## 2020-06-15 ENCOUNTER — Encounter
Admission: RE | Disposition: A | Discharge status: Home / Self Care | Payer: Self-pay | Source: Home / Self Care | Attending: Gastroenterology

## 2020-06-15 ENCOUNTER — Encounter: Payer: Self-pay | Admitting: Gastroenterology

## 2020-06-15 ENCOUNTER — Ambulatory Visit
Admission: RE | Admit: 2020-06-15 | Discharge: 2020-06-15 | Disposition: A | Discharge status: Home / Self Care | Payer: BC Managed Care – PPO | Attending: Gastroenterology | Admitting: Gastroenterology

## 2020-06-15 DIAGNOSIS — R933 Abnormal findings on diagnostic imaging of other parts of digestive tract: Secondary | ICD-10-CM | POA: Insufficient documentation

## 2020-06-15 DIAGNOSIS — E119 Type 2 diabetes mellitus without complications: Secondary | ICD-10-CM | POA: Diagnosis not present

## 2020-06-15 DIAGNOSIS — Z7984 Long term (current) use of oral hypoglycemic drugs: Secondary | ICD-10-CM | POA: Diagnosis not present

## 2020-06-15 DIAGNOSIS — K6389 Other specified diseases of intestine: Secondary | ICD-10-CM

## 2020-06-15 HISTORY — PX: COLONOSCOPY WITH PROPOFOL: SHX5780

## 2020-06-15 LAB — GLUCOSE, CAPILLARY
Glucose-Capillary: 84 mg/dL (ref 70–99)
Glucose-Capillary: 89 mg/dL (ref 70–99)

## 2020-06-15 SURGERY — COLONOSCOPY WITH PROPOFOL
Anesthesia: General | Site: Rectum

## 2020-06-15 MED ORDER — SODIUM CHLORIDE 0.9 % IV SOLN: INTRAVENOUS | Status: DC

## 2020-06-15 MED ORDER — PROPOFOL 10 MG/ML IV BOLUS
INTRAVENOUS | Status: DC | PRN
  Administered 2020-06-15 (×2): 40 mg via INTRAVENOUS
  Administered 2020-06-15: 60 mg via INTRAVENOUS

## 2020-06-15 MED ORDER — STERILE WATER FOR IRRIGATION IR SOLN: Status: DC | PRN

## 2020-06-15 MED ORDER — LIDOCAINE HCL (CARDIAC) PF 100 MG/5ML IV SOSY
PREFILLED_SYRINGE | INTRAVENOUS | Status: DC | PRN
  Administered 2020-06-15: 40 mg via INTRAVENOUS

## 2020-06-15 MED ORDER — LACTATED RINGERS IV SOLN: INTRAVENOUS | Status: DC

## 2020-06-15 SURGICAL SUPPLY — 5 items
GOWN CVR UNV OPN BCK APRN NK (MISCELLANEOUS) ×2 IMPLANT
GOWN ISOL THUMB LOOP REG UNIV (MISCELLANEOUS) ×6
KIT ENDO PROCEDURE OLY (KITS) ×3 IMPLANT
MANIFOLD NEPTUNE II (INSTRUMENTS) ×3 IMPLANT
WATER STERILE IRR 250ML POUR (IV SOLUTION) ×3 IMPLANT

## 2020-06-15 NOTE — Anesthesia Procedure Notes (Signed)
Procedure Name: MAC Date/Time: 06/15/2020 10:03 AM Performed by: Silvana Newness, CRNA Pre-anesthesia Checklist: Patient identified, Emergency Drugs available, Suction available, Patient being monitored and Timeout performed Patient Re-evaluated:Patient Re-evaluated prior to induction Oxygen Delivery Method: Nasal cannula Induction Type: IV induction Placement Confirmation: positive ETCO2

## 2020-06-15 NOTE — Anesthesia Preprocedure Evaluation (Signed)
Anesthesia Evaluation  Patient identified by MRN, date of birth, ID band Patient awake    Reviewed: Allergy & Precautions, H&P , NPO status , Patient's Chart, lab work & pertinent test results  Airway Mallampati: II  TM Distance: >3 FB Neck ROM: full    Dental no notable dental hx.    Pulmonary    Pulmonary exam normal breath sounds clear to auscultation       Cardiovascular Normal cardiovascular exam Rhythm:regular Rate:Normal     Neuro/Psych    GI/Hepatic   Endo/Other  diabetes, Type 2  Renal/GU      Musculoskeletal   Abdominal   Peds  Hematology   Anesthesia Other Findings   Reproductive/Obstetrics                             Anesthesia Physical Anesthesia Plan  ASA: II  Anesthesia Plan: General   Post-op Pain Management:    Induction: Intravenous  PONV Risk Score and Plan: 3 and Treatment may vary due to age or medical condition, TIVA and Propofol infusion  Airway Management Planned: Natural Airway  Additional Equipment:   Intra-op Plan:   Post-operative Plan:   Informed Consent: I have reviewed the patients History and Physical, chart, labs and discussed the procedure including the risks, benefits and alternatives for the proposed anesthesia with the patient or authorized representative who has indicated his/her understanding and acceptance.     Dental Advisory Given  Plan Discussed with: CRNA  Anesthesia Plan Comments:         Anesthesia Quick Evaluation

## 2020-06-15 NOTE — H&P (Signed)
Cephas Darby, MD [address]  [place]  [place], [place] [address]  Main: [phone]  Fax: [phone] Pager: [phone]  Primary Care Physician:  [name], MD Primary Gastroenterologist:  Dr. Cephas Darby  Pre-Procedure History & Physical: HPI:  ELAISHA ZAHNISER is a 59 y.o. female is here for an colonoscopy.   Past Medical History:  Diagnosis Date  . Abnormal Pap smear of cervix   . Anemia    h/o  . Diabetes mellitus without complication [facility])     Past Surgical History:  Procedure Laterality Date  . ENDOMETRIAL ABLATION    . HYSTEROSCOPY WITH D & C    . LEEP      Prior to Admission medications   Medication Sig Start Date End Date Taking? Authorizing Provider  metFORMIN (GLUCOPHAGE) 500 MG tablet Take 500 mg by mouth 2 (two) times daily with a meal.  06/21/19  Yes [provider]    Allergies as of 06/06/2020  . (No Known Allergies)    Family History  Problem Relation Age of Onset  . Bleeding Disorder Mother   . Biliary Cirrhosis Mother   . Liver cancer Mother   . Hypertension Mother   . Diabetes Father   . Lung cancer Father   . Breast cancer Neg Hx     Social History   Socioeconomic History  . Marital status: Married    Spouse name: Not on file  . Number of children: Not on file  . Years of education: Not on file  . Highest education level: Not on file  Occupational History  . Not on file  Tobacco Use  . Smoking status: Never Smoker  . Smokeless tobacco: Never Used  Vaping Use  . Vaping Use: Never used  Substance and Sexual Activity  . Alcohol use: No  . Drug use: No  . Sexual activity: Yes  Other Topics Concern  . Not on file  Social History Narrative  . Not on file   Social Determinants of Health   Financial Resource Strain:   . Difficulty of Paying Living Expenses: Not on file  Food Insecurity:   . Worried About Charity fundraiser in the Last Year: Not on file  . Ran Out of Food in the Last Year: Not  on file  Transportation Needs:   . Lack of Transportation (Medical): Not on file  . Lack of Transportation (Non-Medical): Not on file  Physical Activity:   . Days of Exercise per Week: Not on file  . Minutes of Exercise per Session: Not on file  Stress:   . Feeling of Stress : Not on file  Social Connections:   . Frequency of Communication with Friends and Family: Not on file  . Frequency of Social Gatherings with Friends and Family: Not on file  . Attends Religious Services: Not on file  . Active Member of Clubs or Organizations: Not on file  . Attends Archivist Meetings: Not on file  . Marital Status: Not on file  Intimate Partner Violence:   . Fear of Current or Ex-Partner: Not on file  . Emotionally Abused: Not on file  . Physically Abused: Not on file  . Sexually Abused: Not on file    Review of Systems: See HPI, otherwise negative ROS  Physical Exam: BP 137/81   Pulse 65   Temp 98.1 F (36.7 C) (Temporal)   Ht 5\' 5"  (1.651 m)   Wt 72.6 kg   LMP  06/26/2019   SpO2 100%   BMI 26.63 kg/m  General:   Alert,  pleasant and cooperative in NAD Head:  Normocephalic and atraumatic. Neck:  Supple; no masses or thyromegaly. Lungs:  Clear throughout to auscultation.    Heart:  Regular rate and rhythm. Abdomen:  Soft, nontender and nondistended. Normal bowel sounds, without guarding, and without rebound.   Neurologic:  Alert and  oriented x4;  grossly normal neurologically.  Impression/Plan: YONA KOSEK is here for an colonoscopy to be performed to evaluate hepatic flexure mass  Risks, benefits, limitations, and alternatives regarding  colonoscopy have been reviewed with the patient.  Questions have been answered.  All parties agreeable.

## 2020-06-15 NOTE — Transfer of Care (Signed)
Immediate Anesthesia Transfer of Care Note  Patient: Lauren Lindsey  Procedure(s) Performed: COLONOSCOPY WITH PROPOFOL (N/A Rectum)  Patient Location: PACU  Anesthesia Type: General  Level of Consciousness: awake, alert  and patient cooperative  Airway and Oxygen Therapy: Patient Spontanous Breathing and Patient connected to supplemental oxygen  Post-op Assessment: Post-op Vital signs reviewed, Patient's Cardiovascular Status Stable, Respiratory Function Stable, Patent Airway and No signs of Nausea or vomiting  Post-op Vital Signs: Reviewed and stable  Complications: No complications documented.

## 2020-06-15 NOTE — Op Note (Signed)
[facility] Gastroenterology Patient Name: Lauren Lindsey Procedure Date: 06/15/2020 9:52 AM MRN: [id number] Account #: [id number] Date of Birth: 1961-03-19 Admit Type: Outpatient Age: 59 Room: [facility] OR ROOM 01 Gender: Female Note Status: Finalized Procedure:             Colonoscopy Indications:           Last colonoscopy: February 2014, Suspected colon cancer Providers:             Lin Landsman MD, MD Referring MD:          [name] (Referring MD) Medicines:             Monitored Anesthesia Care Complications:         No immediate complications. Estimated blood loss: None. Procedure:             Pre-Anesthesia Assessment:                        - Prior to the procedure, a History and Physical was                         performed, and patient medications and allergies were                         reviewed. The patient is competent. The risks and                         benefits of the procedure and the sedation options and                         risks were discussed with the patient. All questions                         were answered and informed consent was obtained.                         Patient identification and proposed procedure were                         verified by the physician, the nurse, the                         anesthesiologist, the anesthetist and the technician                         in the pre-procedure area in the procedure room in the                         endoscopy suite. Mental Status Examination: alert and                         oriented. Airway Examination: normal oropharyngeal                         airway and neck mobility. Respiratory Examination:                         clear to auscultation. CV Examination: normal.  Prophylactic Antibiotics: The patient does not require                         prophylactic antibiotics. Prior Anticoagulants: The                         patient has taken no  previous anticoagulant or                         antiplatelet agents. ASA Grade Assessment: II - A                         patient with mild systemic disease. After reviewing                         the risks and benefits, the patient was deemed in                         satisfactory condition to undergo the procedure. The                         anesthesia plan was to use monitored anesthesia care                         (MAC). Immediately prior to administration of                         medications, the patient was re-assessed for adequacy                         to receive sedatives. The heart rate, respiratory                         rate, oxygen saturations, blood pressure, adequacy of                         pulmonary ventilation, and response to care were                         monitored throughout the procedure. The physical                         status of the patient was re-assessed after the                         procedure.                        After obtaining informed consent, the colonoscope was                         passed under direct vision. Throughout the procedure,                         the patient's blood pressure, pulse, and oxygen                         saturations were monitored continuously. The was  introduced through the anus and advanced to the the                         terminal ileum, with identification of the appendiceal                         orifice and IC valve. The colonoscopy was performed                         without difficulty. The patient tolerated the                         procedure well. The quality of the bowel preparation                         was evaluated using the BBPS [facility] Bowel Preparation                         Scale) with scores of: Right Colon = 3, Transverse                         Colon = 3 and Left Colon = 3 (entire mucosa seen well                         with no residual staining, small  fragments of stool or                         opaque liquid). The total BBPS score equals 9. Findings:      The perianal and digital rectal examinations were normal. Pertinent       negatives include normal sphincter tone and no palpable rectal lesions.      The terminal ileum appeared normal.      The entire examined colon appeared normal.      The retroflexed view of the distal rectum and anal verge was normal and       showed no anal or rectal abnormalities. Impression:            - The examined portion of the ileum was normal.                        - The entire examined colon is normal.                        - The distal rectum and anal verge are normal on                         retroflexion view.                        - No specimens collected. Recommendation:        - Discharge patient to home (with escort).                        - Resume previous diet today.                        - Continue present medications.                        -  Repeat colonoscopy in 10 years for screening                         purposes. Procedure Code(s):     --- Professional ---                        [phone], Colonoscopy, flexible; diagnostic, including                         collection of specimen(s) by brushing or washing, when                         performed (separate procedure) CPT copyright 2019 American Medical Association. All rights reserved. The codes documented in this report are preliminary and upon coder review may  be revised to meet current compliance requirements. Dr. Ulyess Mort Lin Landsman MD, MD 06/15/2020 10:17:02 AM This report has been signed electronically. Number of Addenda: 0 Note Initiated On: 06/15/2020 9:52 AM Scope Withdrawal Time: 0 hours 5 minutes 40 seconds  Total Procedure Duration: 0 hours 12 minutes 41 seconds  Estimated Blood Loss:  Estimated blood loss: none. Estimated blood loss: none.      [facility]

## 2020-06-15 NOTE — Anesthesia Postprocedure Evaluation (Signed)
Anesthesia Post Note  Patient: Lauren Lindsey  Procedure(s) Performed: COLONOSCOPY WITH PROPOFOL (N/A Rectum)     Patient location during evaluation: PACU Anesthesia Type: General Level of consciousness: awake and alert and oriented Pain management: satisfactory to patient Vital Signs Assessment: post-procedure vital signs reviewed and stable Respiratory status: spontaneous breathing, nonlabored ventilation and respiratory function stable Cardiovascular status: blood pressure returned to baseline and stable Postop Assessment: Adequate PO intake and No signs of nausea or vomiting Anesthetic complications: no   No complications documented.  Raliegh Ip

## 2020-06-16 ENCOUNTER — Telehealth: Payer: Self-pay

## 2020-06-16 ENCOUNTER — Ambulatory Visit
Admission: RE | Admit: 2020-06-16 | Discharge: 2020-06-16 | Disposition: A | Discharge status: Home / Self Care | Payer: BC Managed Care – PPO | Source: Ambulatory Visit | Attending: Gastroenterology | Admitting: Gastroenterology

## 2020-06-16 ENCOUNTER — Encounter (INDEPENDENT_AMBULATORY_CARE_PROVIDER_SITE_OTHER): Payer: Self-pay

## 2020-06-16 ENCOUNTER — Ambulatory Visit: Admission: RE | Admit: 2020-06-16 | Payer: BC Managed Care – PPO | Source: Ambulatory Visit

## 2020-06-16 ENCOUNTER — Other Ambulatory Visit: Payer: Self-pay

## 2020-06-16 DIAGNOSIS — K769 Liver disease, unspecified: Secondary | ICD-10-CM

## 2020-06-16 DIAGNOSIS — K6389 Other specified diseases of intestine: Secondary | ICD-10-CM | POA: Diagnosis present

## 2020-06-16 MED ORDER — IOHEXOL 300 MG/ML  SOLN
100.0000 mL | Freq: Once | INTRAMUSCULAR | Status: AC | PRN
  Administered 2020-06-16: 100 mL via INTRAVENOUS

## 2020-06-16 NOTE — Telephone Encounter (Signed)
-----   Message from Lin Landsman, MD sent at 06/16/2020 11:23 AM EDT ----- Regarding: Liver lesion Please inform patient that the CT revealed liver lesion, unclear if it is malignancy Recommend MRI liver mass protocol  ThanksRV

## 2020-06-16 NOTE — Telephone Encounter (Signed)
Patient and her husband verbalized understanding of results. Called central scheduled to scheduled the patient for 07/04/2020 at 9:00am. [place]. Nothing to eat or drink 4 hours before the scan. Patient husband verbalized understanding

## 2020-06-20 ENCOUNTER — Ambulatory Visit (INDEPENDENT_AMBULATORY_CARE_PROVIDER_SITE_OTHER): Payer: BC Managed Care – PPO | Admitting: Surgery

## 2020-06-20 ENCOUNTER — Other Ambulatory Visit: Payer: Self-pay

## 2020-06-20 ENCOUNTER — Encounter: Payer: Self-pay | Admitting: Surgery

## 2020-06-20 VITALS — BP 136/79 | HR 73 | Temp 98.0°F | Resp 12 | Ht 65.0 in | Wt 164.6 lb

## 2020-06-20 DIAGNOSIS — C786 Secondary malignant neoplasm of retroperitoneum and peritoneum: Secondary | ICD-10-CM

## 2020-06-20 DIAGNOSIS — C801 Malignant (primary) neoplasm, unspecified: Secondary | ICD-10-CM

## 2020-06-20 NOTE — Patient Instructions (Addendum)
Keep your appointment for your Liver MRI 07/04/20.  We will contact Dr Marisue Brooklyn office to get an appointment set up for you to see him.   We have placed a referral to Oncology. Their office should contact you within 7-10 days if not sooner to make an appointment. If you don't hear from their office please give [place] a call.   See your appointment below, call the office if you have any questions or concerns.

## 2020-06-20 NOTE — Progress Notes (Signed)
Lauren Lindsey returns today to discuss her colonoscopy results, her last CT scan, and plans for future evaluation, and eventual treatment. 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 She had not been referred to oncology as of yet. And though her CT scan does not show any abnormality in the pelvis, it may be wise to follow-up with her gynecologist for reevaluation of her ovaries. She has an MRI scheduled to further interrogate the CT noted abnormalities of her liver. In a secure chat with Dr. Marius Ditch we decided to defer further endoscopy pending oncological evaluation.  On examination her incisions are all well-healed, and she can now submerge them under water more than she has.  She has no complaints of pain, nausea or vomiting or loss of appetite.  We will follow-up her MRI evaluation, refer her to Dr. [name] for further investigation.  Advised and made appointment for follow-up with her gynecologist Dr. Glennon Mac, she is due for her annual visit in the next 2 weeks anyway. I will defer further interrogation to oncology.  I will be glad to assist in any way in defining the organ source of this carcinoma.

## 2020-06-21 ENCOUNTER — Telehealth: Payer: Self-pay

## 2020-06-21 ENCOUNTER — Encounter: Payer: Self-pay | Admitting: Oncology

## 2020-06-21 NOTE — Progress Notes (Signed)
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 Luther Bradley, for peritoneal carcinomatosis. Spoke with patient's husband during pre assessment. He denies patient is having any pain or concerns at this time.

## 2020-06-21 NOTE — Telephone Encounter (Signed)
Received letter from [facility] requesting medical records be faxed to [phone]. Office notes faxed today.

## 2020-06-22 ENCOUNTER — Inpatient Hospital Stay: Payer: BC Managed Care – PPO

## 2020-06-22 ENCOUNTER — Other Ambulatory Visit: Payer: Self-pay

## 2020-06-22 ENCOUNTER — Encounter (INDEPENDENT_AMBULATORY_CARE_PROVIDER_SITE_OTHER): Payer: Self-pay

## 2020-06-22 ENCOUNTER — Encounter: Payer: Self-pay | Admitting: Oncology

## 2020-06-22 ENCOUNTER — Inpatient Hospital Stay: Payer: BC Managed Care – PPO | Attending: Oncology | Admitting: Oncology

## 2020-06-22 VITALS — BP 115/54 | HR 70 | Temp 97.4°F | Resp 20 | Ht 65.0 in | Wt 165.5 lb

## 2020-06-22 DIAGNOSIS — E119 Type 2 diabetes mellitus without complications: Secondary | ICD-10-CM | POA: Diagnosis not present

## 2020-06-22 DIAGNOSIS — Z79899 Other long term (current) drug therapy: Secondary | ICD-10-CM | POA: Insufficient documentation

## 2020-06-22 DIAGNOSIS — C801 Malignant (primary) neoplasm, unspecified: Secondary | ICD-10-CM | POA: Insufficient documentation

## 2020-06-22 DIAGNOSIS — Z7984 Long term (current) use of oral hypoglycemic drugs: Secondary | ICD-10-CM | POA: Diagnosis not present

## 2020-06-22 DIAGNOSIS — K7689 Other specified diseases of liver: Secondary | ICD-10-CM | POA: Insufficient documentation

## 2020-06-22 DIAGNOSIS — D649 Anemia, unspecified: Secondary | ICD-10-CM | POA: Diagnosis not present

## 2020-06-22 DIAGNOSIS — C786 Secondary malignant neoplasm of retroperitoneum and peritoneum: Secondary | ICD-10-CM | POA: Insufficient documentation

## 2020-06-22 DIAGNOSIS — K760 Fatty (change of) liver, not elsewhere classified: Secondary | ICD-10-CM | POA: Insufficient documentation

## 2020-06-22 DIAGNOSIS — Z800 Family history of malignant neoplasm of digestive organs: Secondary | ICD-10-CM | POA: Diagnosis not present

## 2020-06-22 DIAGNOSIS — Z803 Family history of malignant neoplasm of breast: Secondary | ICD-10-CM | POA: Insufficient documentation

## 2020-06-22 DIAGNOSIS — Z801 Family history of malignant neoplasm of trachea, bronchus and lung: Secondary | ICD-10-CM | POA: Insufficient documentation

## 2020-06-22 DIAGNOSIS — Z8249 Family history of ischemic heart disease and other diseases of the circulatory system: Secondary | ICD-10-CM | POA: Diagnosis not present

## 2020-06-22 DIAGNOSIS — Z833 Family history of diabetes mellitus: Secondary | ICD-10-CM | POA: Insufficient documentation

## 2020-06-22 NOTE — Progress Notes (Signed)
[place]  [phone] [phone]  ID: Lauren Lindsey OB: 08/10/1961  MR#: [id number]  LTJ#:[id number]  Patient Care Team: [name], MD as PCP - General (Family Medicine) Patient, No Pcp Per (General Practice) [name], RN as Oncology Nurse Navigator  CHIEF COMPLAINT: Peritoneal carcinomatosis.  INTERVAL HISTORY: Patient is a 59 year old female who was incidentally noted to have several peritoneal nodules on routine cholecystectomy.  Subsequent biopsy confirmed malignancy.  She currently feels well and is asymptomatic.  She has no neurologic complaints.  She denies any recent fevers or illnesses.  She has a good appetite and denies weight loss.  She has no chest pain, shortness of breath, cough, or hemoptysis.  Her abdominal pain resolved with her cholecystectomy.  She denies any nausea, vomiting, constipation, or diarrhea.  She has no melena or hematochezia.  She has no urinary complaints.  Patient feels at her baseline and offers no specific complaints today.  REVIEW OF SYSTEMS:   Review of Systems  Constitutional: Negative.  Negative for fever, malaise/fatigue and weight loss.  Respiratory: Negative.  Negative for cough, hemoptysis and shortness of breath.   Cardiovascular: Negative.  Negative for chest pain and leg swelling.  Gastrointestinal: Negative.  Negative for abdominal pain, blood in stool, constipation, diarrhea, melena, nausea and vomiting.  Genitourinary: Negative.  Negative for dysuria.  Musculoskeletal: Negative.   Skin: Negative.  Negative for rash.  Neurological: Negative.  Negative for dizziness, focal weakness, weakness and headaches.  Psychiatric/Behavioral: Negative.  The patient is not nervous/anxious.     As per HPI. Otherwise, a complete review of systems is negative.  PAST MEDICAL HISTORY: Past Medical History:  Diagnosis Date  . Abnormal Pap smear of cervix   . Anemia    h/o  . Diabetes mellitus  without complication ([place])     PAST SURGICAL HISTORY: Past Surgical History:  Procedure Laterality Date  . COLONOSCOPY WITH PROPOFOL N/A 06/15/2020   Procedure: COLONOSCOPY WITH PROPOFOL;  Surgeon: Lin Landsman, MD;  Location: [place];  Service: Endoscopy;  Laterality: N/A;  Diabetic - oral meds  . ENDOMETRIAL ABLATION    . HYSTEROSCOPY WITH D & C    . LEEP      FAMILY HISTORY: Family History  Problem Relation Age of Onset  . Bleeding Disorder Mother   . Biliary Cirrhosis Mother   . Liver cancer Mother   . Hypertension Mother   . Diabetes Father   . Lung cancer Father   . Breast cancer Neg Hx     ADVANCED DIRECTIVES (Y/N):  N  HEALTH MAINTENANCE: Social History   Tobacco Use  . Smoking status: Never Smoker  . Smokeless tobacco: Never Used  Vaping Use  . Vaping Use: Never used  Substance Use Topics  . Alcohol use: No  . Drug use: No     Colonoscopy:  PAP:  Bone density:  Lipid panel:  No Known Allergies  Current Outpatient Medications  Medication Sig Dispense Refill  . metFORMIN (GLUCOPHAGE) 500 MG tablet Take 500 mg by mouth 2 (two) times daily with a meal.      No current facility-administered medications for this visit.    OBJECTIVE: Vitals:   06/22/20 1312  BP: (!) 115/54  Pulse: 70  Resp: 20  Temp: (!) 97.4 F (36.3 C)  SpO2: 100%     Body mass index is 27.54 kg/m.    ECOG FS:0 - Asymptomatic  General: Well-developed, well-nourished, no acute distress. Eyes: Pink  conjunctiva, anicteric sclera. HEENT: Normocephalic, moist mucous membranes. Lungs: No audible wheezing or coughing. Heart: Regular rate and rhythm. Abdomen: Soft, nontender, no obvious distention. Musculoskeletal: No edema, cyanosis, or clubbing. Neuro: Alert, answering all questions appropriately. Cranial nerves grossly intact. Skin: No rashes or petechiae noted. Psych: Normal affect. Lymphatics: No cervical, calvicular, axillary or inguinal LAD.   LAB  RESULTS:  Lab Results  Component Value Date   NA 139 05/23/2020   K 3.6 05/23/2020   CL 106 05/23/2020   CO2 26 05/23/2020   GLUCOSE 91 05/23/2020   BUN 24 (H) 05/23/2020   CREATININE 0.74 05/23/2020   CALCIUM 8.6 (L) 05/23/2020   PROT 7.9 05/23/2020   ALBUMIN 4.2 05/23/2020   AST 50 (H) 05/23/2020   ALT 74 (H) 05/23/2020   ALKPHOS 52 05/23/2020   BILITOT 1.0 05/23/2020   GFRNONAA >60 05/23/2020   GFRAA >60 05/23/2020    Lab Results  Component Value Date   WBC 10.9 (H) 05/23/2020   HGB 11.3 (L) 05/23/2020   HCT 35.6 (L) 05/23/2020   MCV 80.2 05/23/2020   PLT 401 (H) 05/23/2020     STUDIES: CT ABDOMEN PELVIS W CONTRAST  Result Date: 06/16/2020 CLINICAL DATA:  Palpable Paddock flexure mass during cholecystectomy. EXAM: CT ABDOMEN AND PELVIS WITH CONTRAST TECHNIQUE: Multidetector CT imaging of the abdomen and pelvis was performed using the standard protocol following bolus administration of intravenous contrast. CONTRAST:  162mL OMNIPAQUE IOHEXOL 300 MG/ML  SOLN COMPARISON:  Ultrasound 05/23/2020 FINDINGS: Lower chest: Partially calcified nodule in the right middle lobe measures 9 mm, likely granuloma. Hepatobiliary: Subtle low-density area within the inferior right hepatic lobe measures 3 cm on image 36 of series 2. This area is difficult to visualized on delayed imaging. Prior cholecystectomy. Pancreas: No focal abnormality or ductal dilatation. Spleen: No focal abnormality.  Normal size. Adrenals/Urinary Tract: Bilateral parapelvic cysts. Punctate nonobstructing stone in the midpole of the right kidney. No hydronephrosis. Adrenal glands and urinary bladder unremarkable. Stomach/Bowel: Stomach, large and small bowel grossly unremarkable. Normal appendix. Stranding in the right upper quadrant adjacent to the a patent flexure likely related to recent cholecystectomy. Vascular/Lymphatic: No evidence of aneurysm or adenopathy. Reproductive: Uterus and adnexa unremarkable.  No mass.  Other: No free fluid or free air. Musculoskeletal: No acute bony abnormality. IMPRESSION: Hypodense non cystic area within the inferior right hepatic lobe cyst. This may reflect hemangioma but warrants further characterization. Recommend MRI. Stranding in the right upper quadrant adjacent to the hepatic flexure without underlying bowel wall abnormality. This is likely expected postoperative change. Electronically Signed   By: Rolm Baptise M.D.   On: 06/16/2020 10:56   US Abdomen Limited RUQ  Result Date: 05/23/2020 CLINICAL DATA:  Right upper quadrant pain EXAM: ULTRASOUND ABDOMEN LIMITED RIGHT UPPER QUADRANT COMPARISON:  None. FINDINGS: Gallbladder: Small echogenic adherent probable polyps are noted the largest measuring 8 mm. There is layering calcified gallstones, measuring up to 8 mm. No gallbladder wall thickening. No sonographic Murphy sign noted by sonographer. Common bile duct: Diameter: 2 mm Liver: Increased echotexture seen throughout. No focal abnormality or biliary ductal dilatation. Portal vein is patent on color Doppler imaging with normal direction of blood flow towards the liver. Other: None. IMPRESSION: Cholelithiasis and gallbladder polyps. No evidence of acute cholecystitis. Hepatic steatosis Electronically Signed   By: Prudencio Pair M.D.   On: 05/23/2020 21:43    ASSESSMENT: Peritoneal carcinomatosis.  PLAN:    1. Peritoneal carcinomatosis: Incidental finding on routine cholecystectomy.  CT scan of  the abdomen pelvis did not reveal a distinct primary.  Colonoscopy on June 15, 2020 was essentially within normal limits.  Patient has an MRI of the abdomen scheduled to further evaluate an indeterminate liver lesion.  Biopsy was sent to [facility] for expert review and is pending at time of dictation.  Will order tumor markers as well as PET scan for further evaluation and to complete the staging work-up.  Patient also may benefit from genetic testing in the future.  Patient will  return to clinic in 3 weeks after her imaging is complete to discuss her final pathology results and treatment planning.  I spent a total of 60 minutes reviewing chart data, face-to-face evaluation with the patient, counseling and coordination of care as detailed above.  Patient expressed understanding and was in agreement with this plan. She also understands that She can call clinic at any time with any questions, concerns, or complaints.   Cancer Staging No matching staging information was found for the patient.  Lloyd Huger, MD   06/22/2020 3:13 PM

## 2020-06-23 ENCOUNTER — Telehealth: Payer: Self-pay

## 2020-06-23 LAB — CA 125: Cancer Antigen (CA) 125: 23.7 U/mL (ref 0.0–38.1)

## 2020-06-23 LAB — CANCER ANTIGEN 27.29: CA 27.29: 10.3 U/mL (ref 0.0–38.6)

## 2020-06-23 LAB — CA 19-9 (SERIAL): CA 19-9: 12 U/mL (ref 0–35)

## 2020-06-23 LAB — CEA: CEA: 1.3 ng/mL (ref 0.0–4.7)

## 2020-06-23 NOTE — Telephone Encounter (Signed)
Called and notified Lauren Lindsey that we are still awaiting authorization for her PET scan. We are required to cancel 10/5 PET and reschedule once authorization is obtained. He verbalized understanding.

## 2020-06-26 ENCOUNTER — Other Ambulatory Visit: Payer: Self-pay

## 2020-06-28 ENCOUNTER — Encounter: Payer: BC Managed Care – PPO | Admitting: Obstetrics and Gynecology

## 2020-07-04 ENCOUNTER — Other Ambulatory Visit: Payer: BC Managed Care – PPO

## 2020-07-04 ENCOUNTER — Telehealth: Payer: Self-pay | Admitting: *Deleted

## 2020-07-04 NOTE — Telephone Encounter (Signed)
Mr Gutkowski called stating that patient PET was cancelled (No insurance Lauren Lindsey was available) and he is asking what we are going to do next. She has a follow up appointment 10/14 for results that will not be done.Please return his call to discuss

## 2020-07-04 NOTE — Telephone Encounter (Signed)
Adding authorization team as her 06/27/20 PET was cancelled back on 06/23/20 awaiting authorization.

## 2020-07-05 ENCOUNTER — Telehealth: Payer: Self-pay

## 2020-07-05 ENCOUNTER — Other Ambulatory Visit: Payer: Self-pay | Admitting: *Deleted

## 2020-07-05 DIAGNOSIS — K769 Liver disease, unspecified: Secondary | ICD-10-CM

## 2020-07-05 NOTE — Telephone Encounter (Signed)
Called and updated Lauren Lindsey that PET scan has been in appeal since 06/27/20. We are still awaiting for pathology consult from [facility]. He states MRI was also denied by insurance. We have resubmitted and gotten this approved and scheduled for 10/28. We will leave cancer center appointment for 10/21 at this time. Lauren Lindsey asked if he needed to keep appointment with Dr. Glennon Mac tomorrow. Instructed to keep this appointment. She was referred by surgery for pelvic exam.

## 2020-07-06 ENCOUNTER — Telehealth: Payer: Self-pay

## 2020-07-06 ENCOUNTER — Other Ambulatory Visit (HOSPITAL_COMMUNITY)
Admission: RE | Admit: 2020-07-06 | Discharge: 2020-07-06 | Disposition: A | Discharge status: Home / Self Care | Payer: BC Managed Care – PPO | Source: Ambulatory Visit | Attending: Obstetrics and Gynecology | Admitting: Obstetrics and Gynecology

## 2020-07-06 ENCOUNTER — Ambulatory Visit (INDEPENDENT_AMBULATORY_CARE_PROVIDER_SITE_OTHER): Payer: BC Managed Care – PPO | Admitting: Obstetrics and Gynecology

## 2020-07-06 ENCOUNTER — Encounter: Payer: Self-pay | Admitting: Obstetrics and Gynecology

## 2020-07-06 ENCOUNTER — Other Ambulatory Visit: Payer: Self-pay

## 2020-07-06 VITALS — BP 159/83 | Ht 65.0 in | Wt 167.0 lb

## 2020-07-06 DIAGNOSIS — C786 Secondary malignant neoplasm of retroperitoneum and peritoneum: Secondary | ICD-10-CM | POA: Insufficient documentation

## 2020-07-06 DIAGNOSIS — Z1339 Encounter for screening examination for other mental health and behavioral disorders: Secondary | ICD-10-CM

## 2020-07-06 DIAGNOSIS — R2232 Localized swelling, mass and lump, left upper limb: Secondary | ICD-10-CM

## 2020-07-06 DIAGNOSIS — Z01419 Encounter for gynecological examination (general) (routine) without abnormal findings: Secondary | ICD-10-CM | POA: Insufficient documentation

## 2020-07-06 DIAGNOSIS — Z1331 Encounter for screening for depression: Secondary | ICD-10-CM

## 2020-07-06 DIAGNOSIS — R9389 Abnormal findings on diagnostic imaging of other specified body structures: Secondary | ICD-10-CM

## 2020-07-06 NOTE — Progress Notes (Addendum)
Routine Annual Gynecology Examination   PCP: [name], MD  Chief Complaint  Patient presents with  . Referral    pelvic exam    History of Present Illness: Patient is a 59 y.o. G0P0000 presents for annual exam. The patient has no complaints today.   Menopausal bleeding: denies  Menopausal symptoms: denies  Breast symptoms: denies  Last pap smear: 1 years ago.  Result Normal  Last mammogram: 10 months ago.  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 She had a CT scan of her abdomen and pelvis that was unremarkable. The peritoneal carcinomatosis was discovered incidentally during a lap cholecystectomy.  She has lost 10 pounds over about 1 year, intentionally per her report.    Past Medical History:  Diagnosis Date  . Abnormal Pap smear of cervix   . Anemia    h/o  . Diabetes mellitus without complication [facility])     Past Surgical History:  Procedure Laterality Date  . COLONOSCOPY WITH PROPOFOL N/A 06/15/2020   Procedure: COLONOSCOPY WITH PROPOFOL;  Surgeon: Lin Landsman, MD;  Location: [place];  Service: Endoscopy;  Laterality: N/A;  Diabetic - oral meds  . ENDOMETRIAL ABLATION    . HYSTEROSCOPY WITH D & C    . LEEP      Prior to Admission medications   Medication Sig Start Date End Date Taking? Authorizing Provider  metFORMIN (GLUCOPHAGE) 500 MG tablet Take 500 mg by mouth 2 (two) times daily with a meal.  06/21/19  Yes [provider]   Allergies: No Known Allergies  Obstetric History: G0P0000  Social History   Socioeconomic History  . Marital status: Married    Spouse name: Not on file  . Number of children: Not on file  . Years of education: Not on file  . Highest education level: Not on file  Occupational History  . Not on file  Tobacco Use  . Smoking status: Never Smoker  . Smokeless tobacco: Never Used  Vaping Use  . Vaping Use:  Never used  Substance and Sexual Activity  . Alcohol use: No  . Drug use: No  . Sexual activity: Yes    Birth control/protection: Post-menopausal  Other Topics Concern  . Not on file  Social History Narrative  . Not on file   Social Determinants of Health   Financial Resource Strain:   . Difficulty of Paying Living Expenses: Not on file  Food Insecurity:   . Worried About Charity fundraiser in the Last Year: Not on file  . Ran Out of Food in the Last Year: Not on file  Transportation Needs:   . Lack of Transportation (Medical): Not on file  . Lack of Transportation (Non-Medical): Not on file  Physical Activity:   . Days of Exercise per Week: Not on file  . Minutes of Exercise per Session: Not on file  Stress:   . Feeling of Stress : Not on file  Social Connections:   . Frequency of Communication with Friends and Family: Not on file  . Frequency of Social Gatherings with Friends and Family: Not on file  . Attends Religious Services: Not on file  . Active Member of Clubs or Organizations: Not on file  . Attends Archivist Meetings: Not on file  . Marital Status: Not on file  Intimate Partner Violence:   . Fear of Current or Ex-Partner: Not on file  .  Emotionally Abused: Not on file  . Physically Abused: Not on file  . Sexually Abused: Not on file    Family History  Problem Relation Age of Onset  . Bleeding Disorder Mother   . Biliary Cirrhosis Mother   . Liver cancer Mother   . Hypertension Mother   . Diabetes Father   . Lung cancer Father   . Breast cancer Neg Hx     Review of Systems  Constitutional: Negative.   HENT: Negative.   Eyes: Negative.   Respiratory: Negative.   Cardiovascular: Negative.   Gastrointestinal: Negative.   Genitourinary: Negative.   Musculoskeletal: Negative.   Skin: Negative.   Neurological: Negative.   Psychiatric/Behavioral: Negative.      Physical Exam Vitals: BP (!) 159/83   Ht 5\' 5"  (1.651 m)   Wt 167 lb (75.8  kg)   LMP 06/26/2019   BMI 27.79 kg/m   Physical Exam Constitutional:      General: She is not in acute distress.    Appearance: Normal appearance. She is well-developed.  Genitourinary:     Pelvic exam was performed with patient in the lithotomy position.     Vulva, urethra, bladder and uterus normal.     No inguinal adenopathy present in the right or left side.    No signs of injury in the vagina.     No vaginal discharge, erythema, tenderness or bleeding.     No cervical motion tenderness, discharge, lesion or polyp.     Uterus is mobile.     Uterus is not enlarged or tender.     No uterine mass detected.    Uterus is anteverted.     No right or left adnexal mass present.     Right adnexa not tender or full.     Left adnexa not tender or full.  HENT:     Head: Normocephalic and atraumatic.  Eyes:     General: No scleral icterus.    Conjunctiva/sclera: Conjunctivae normal.  Neck:     Thyroid: No thyromegaly.  Cardiovascular:     Rate and Rhythm: Normal rate and regular rhythm.     Heart sounds: No murmur heard.  No friction rub. No gallop.   Pulmonary:     Effort: Pulmonary effort is normal. No respiratory distress.     Breath sounds: Normal breath sounds. No wheezing or rales.  Chest:     Breasts:        Right: No inverted nipple, mass, nipple discharge, skin change or tenderness.        Left: No inverted nipple, mass, nipple discharge, skin change or tenderness.  Abdominal:     General: Bowel sounds are normal. There is no distension.     Palpations: Abdomen is soft. There is no mass.     Tenderness: There is no abdominal tenderness. There is no guarding or rebound.  Musculoskeletal:        General: No swelling or tenderness. Normal range of motion.     Cervical back: Normal range of motion and neck supple.  Lymphadenopathy:     Cervical: No cervical adenopathy.     Upper Body:     Right upper body: No axillary adenopathy.     Left upper body: Axillary  adenopathy present.     Lower Body: No right inguinal adenopathy. No left inguinal adenopathy.  Neurological:     General: No focal deficit present.     Mental Status: She is alert and oriented to person,  place, and time.     Cranial Nerves: No cranial nerve deficit.  Skin:    General: Skin is warm and dry.     Findings: No erythema or rash.  Psychiatric:        Mood and Affect: Mood normal.        Behavior: Behavior normal.        Judgment: Judgment normal.     Endometrial Biopsy After discussion with the patient regarding her abnormal uterine bleeding I recommended that she proceed with an endometrial biopsy for further diagnosis. The risks, benefits, alternatives, and indications for an endometrial biopsy were discussed with the patient in detail. She understood the risks including infection, bleeding, cervical laceration and uterine perforation.  Verbal consent was obtained.   PROCEDURE NOTE:  Pipelle endometrial biopsy was performed using aseptic technique with iodine preparation.  The uterus was sounded to a length of 4 cm.  I attempted to enter the uterus, but was unable.  I attempted to dilated the cervix further. However, I was unable to do so without fear of perforating her uterus.   Adequate sampling was not obtained with minimal blood loss.  The patient tolerated the procedure well.     Female chaperone present for pelvic and breast  portions of the physical exam  Assessment and Plan:  59 y.o. G0P0000 female here for routine annual gynecologic examination  Plan: Problem List Items Addressed This Visit      Other   Peritoneal carcinomatosis ([place])   Relevant Orders   US PELVIS TRANSVAGINAL NON-OB (TV ONLY)   Cytology - PAP    Other Visit Diagnoses    Women's annual routine gynecological examination    -  Primary   Relevant Orders   US PELVIS TRANSVAGINAL NON-OB (TV ONLY)   Cytology - PAP   Screening for depression       Screening for alcoholism       Lump of  axilla, left       Relevant Orders   MM DIAG BREAST TOMO BILATERAL   US BREAST LTD UNI LEFT INC AXILLA   Thickened endometrium          Screening: -- Blood pressure screen elevated: continued to monitor. -- Colonoscopy - not due -- Mammogram - will order diagnostic based on lump in left axilla -- Weight screening: normal -- Depression screening negative (PHQ-9) -- Nutrition: normal -- cholesterol screening: per PCP -- osteoporosis screening: not due -- tobacco screening: not using -- alcohol screening: AUDIT questionnaire indicates low-risk usage. -- family history of breast cancer screening: done. not at high risk. -- no evidence of domestic violence or intimate partner violence. -- STD screening: gonorrhea/chlamydia NAAT not collected per patient request. -- pap smear collected per ASCCP guidelines  Peritoneal carcinomatosis of unknown primary:  Performed pap smear today. I attempted an endometrial biopsy due to what appeared to be a thickened endometrium(?) on CT scan.  Biopsy was unsuccessful.  I will order a pelvic ultrasound.  CA125 was normal.  Will also order diagnostic mammogram due to axillary lump on left.  No other masses noted.  No external lesions.  Workup ongoing through oncology.    [name], MD 07/06/2020 12:07 PM

## 2020-07-06 NOTE — Telephone Encounter (Signed)
All scans are now approved and scheduled. Called and left voicemail with Lauren Lindsey to return call for details. No active my chart noted.

## 2020-07-06 NOTE — Addendum Note (Signed)
Addended by: [name] D on: 07/06/2020 12:07 PM   Modules accepted: Orders

## 2020-07-06 NOTE — Telephone Encounter (Signed)
Mr. Lauren Lindsey returned call. Went over all upcoming appointments and scan instructions. He has a printed copy of AVS from appointment earlier today that also list them. All questions answered.

## 2020-07-07 NOTE — Progress Notes (Signed)
[facility]  [phone] [phone]  ID: Lauren Lindsey OB: 14-Aug-1961  MR#: [id number]  FYB#:[id number]  Patient Care Team: [name], MD as PCP - General (Family Medicine) Patient, No Pcp Per (General Practice) [name], RN as Oncology Nurse Navigator  CHIEF COMPLAINT: Peritoneal carcinomatosis.  INTERVAL HISTORY: Patient returns to clinic today for further evaluation and discussion of her imaging results.  She continues to feel well and remains asymptomatic. She has no neurologic complaints.  She denies any recent fevers or illnesses.  She has a good appetite and denies weight loss.  She has no chest pain, shortness of breath, cough, or hemoptysis. Her abdominal pain resolved with her cholecystectomy.  She denies any nausea, vomiting, constipation, or diarrhea.  She has no melena or hematochezia.  She has no urinary complaints.  Patient offers no specific complaints today.  REVIEW OF SYSTEMS:   Review of Systems  Constitutional: Negative.  Negative for fever, malaise/fatigue and weight loss.  Respiratory: Negative.  Negative for cough, hemoptysis and shortness of breath.   Cardiovascular: Negative.  Negative for chest pain and leg swelling.  Gastrointestinal: Negative.  Negative for abdominal pain, blood in stool, constipation, diarrhea, melena, nausea and vomiting.  Genitourinary: Negative.  Negative for dysuria.  Musculoskeletal: Negative.   Skin: Negative.  Negative for rash.  Neurological: Negative.  Negative for dizziness, focal weakness, weakness and headaches.  Psychiatric/Behavioral: Negative.  The patient is not nervous/anxious.     As per HPI. Otherwise, a complete review of systems is negative.  PAST MEDICAL HISTORY: Past Medical History:  Diagnosis Date  . Abnormal Pap smear of cervix   . Anemia    h/o  . Diabetes mellitus without complication ([place])     PAST SURGICAL HISTORY: Past Surgical History:   Procedure Laterality Date  . COLONOSCOPY WITH PROPOFOL N/A 06/15/2020   Procedure: COLONOSCOPY WITH PROPOFOL;  Surgeon: Lin Landsman, MD;  Location: [place];  Service: Endoscopy;  Laterality: N/A;  Diabetic - oral meds  . ENDOMETRIAL ABLATION    . HYSTEROSCOPY WITH D & C    . LEEP      FAMILY HISTORY: Family History  Problem Relation Age of Onset  . Bleeding Disorder Mother   . Biliary Cirrhosis Mother   . Liver cancer Mother   . Hypertension Mother   . Diabetes Father   . Lung cancer Father   . Breast cancer Neg Hx     ADVANCED DIRECTIVES (Y/N):  N  HEALTH MAINTENANCE: Social History   Tobacco Use  . Smoking status: Never Smoker  . Smokeless tobacco: Never Used  Vaping Use  . Vaping Use: Never used  Substance Use Topics  . Alcohol use: No  . Drug use: No     Colonoscopy:  PAP:  Bone density:  Lipid panel:  No Known Allergies  Current Outpatient Medications  Medication Sig Dispense Refill  . metFORMIN (GLUCOPHAGE) 500 MG tablet Take 500 mg by mouth 2 (two) times daily with a meal.      No current facility-administered medications for this visit.    OBJECTIVE: Vitals:   07/14/20 0946  BP: 101/66  Pulse: 66  Resp: 16  Temp: (!) 97.3 F (36.3 C)  SpO2: 100%     Body mass index is 27.92 kg/m.    ECOG FS:0 - Asymptomatic  General: Well-developed, well-nourished, no acute distress. Eyes: Pink conjunctiva, anicteric sclera. HEENT: Normocephalic, moist mucous membranes. Lungs: No audible wheezing or coughing. Heart:  Regular rate and rhythm. Abdomen: Soft, nontender, no obvious distention. Musculoskeletal: No edema, cyanosis, or clubbing. Neuro: Alert, answering all questions appropriately. Cranial nerves grossly intact. Skin: No rashes or petechiae noted. Psych: Normal affect.   LAB RESULTS:  Lab Results  Component Value Date   NA 139 05/23/2020   K 3.6 05/23/2020   CL 106 05/23/2020   CO2 26 05/23/2020   GLUCOSE 91  05/23/2020   BUN 24 (H) 05/23/2020   CREATININE 0.74 05/23/2020   CALCIUM 8.6 (L) 05/23/2020   PROT 7.9 05/23/2020   ALBUMIN 4.2 05/23/2020   AST 50 (H) 05/23/2020   ALT 74 (H) 05/23/2020   ALKPHOS 52 05/23/2020   BILITOT 1.0 05/23/2020   GFRNONAA >60 05/23/2020   GFRAA >60 05/23/2020    Lab Results  Component Value Date   WBC 10.9 (H) 05/23/2020   HGB 11.3 (L) 05/23/2020   HCT 35.6 (L) 05/23/2020   MCV 80.2 05/23/2020   PLT 401 (H) 05/23/2020     STUDIES: CT ABDOMEN PELVIS W CONTRAST  Result Date: 06/16/2020 CLINICAL DATA:  Palpable Paddock flexure mass during cholecystectomy. EXAM: CT ABDOMEN AND PELVIS WITH CONTRAST TECHNIQUE: Multidetector CT imaging of the abdomen and pelvis was performed using the standard protocol following bolus administration of intravenous contrast. CONTRAST:  110mL OMNIPAQUE IOHEXOL 300 MG/ML  SOLN COMPARISON:  Ultrasound 05/23/2020 FINDINGS: Lower chest: Partially calcified nodule in the right middle lobe measures 9 mm, likely granuloma. Hepatobiliary: Subtle low-density area within the inferior right hepatic lobe measures 3 cm on image 36 of series 2. This area is difficult to visualized on delayed imaging. Prior cholecystectomy. Pancreas: No focal abnormality or ductal dilatation. Spleen: No focal abnormality.  Normal size. Adrenals/Urinary Tract: Bilateral parapelvic cysts. Punctate nonobstructing stone in the midpole of the right kidney. No hydronephrosis. Adrenal glands and urinary bladder unremarkable. Stomach/Bowel: Stomach, large and small bowel grossly unremarkable. Normal appendix. Stranding in the right upper quadrant adjacent to the a patent flexure likely related to recent cholecystectomy. Vascular/Lymphatic: No evidence of aneurysm or adenopathy. Reproductive: Uterus and adnexa unremarkable.  No mass. Other: No free fluid or free air. Musculoskeletal: No acute bony abnormality. IMPRESSION: Hypodense non cystic area within the inferior right  hepatic lobe cyst. This may reflect hemangioma but warrants further characterization. Recommend MRI. Stranding in the right upper quadrant adjacent to the hepatic flexure without underlying bowel wall abnormality. This is likely expected postoperative change. Electronically Signed   By: Rolm Baptise M.D.   On: 06/16/2020 10:56   NM PET Image Initial (PI) Skull Base To Thigh  Result Date: 07/14/2020 CLINICAL DATA:  Initial treatment strategy for peritoneal carcinomatosis. Incidental peritoneal implant discovered during cholecystectomy. EXAM: NUCLEAR MEDICINE PET SKULL BASE TO THIGH TECHNIQUE: 9.02 mCi F-18 FDG was injected intravenously. Full-ring PET imaging was performed from the skull base to thigh after the radiotracer. CT data was obtained and used for attenuation correction and anatomic localization. Fasting blood glucose: 66 mg/dl COMPARISON:  Abdominopelvic CT 06/16/2020. Abdominal ultrasound 05/23/2020. FINDINGS: Mediastinal blood pool activity: SUV max 2.4 NECK: No hypermetabolic cervical lymph nodes are identified.There are no lesions of the pharyngeal mucosal space. There is physiologic metabolic activity within the lymphoid tissue of Waldeyer's ring. Incidental CT findings: none CHEST: There are no hypermetabolic mediastinal, hilar or axillary lymph nodes. No hypermetabolic pulmonary activity or suspicious pulmonary nodularity. There is a calcified right middle lobe granuloma. Incidental CT findings: Dilated azygos vein. ABDOMEN/PELVIS: There is prominent hypermetabolic activity within the cholecystectomy bed with inferolateral extension  in the right hepatic lobe towards the abnormality seen on previous CT. The hepatic lesion is not well seen on this noncontrast study, although measures approximately 2.5 cm on image 156/3. This demonstrates an SUV max of 8.3, suspicious for malignancy. There is also focal hypermetabolic activity in porta hepatis (SUV max 6.2) which may relate to an ampullary lesion  or lymph node. There is an additional (5 mm) lymph node in the gastrohepatic ligament which is mildly hypermetabolic for size (SUV max 4.0). No other hypermetabolic abdominopelvic lymph nodes are identified. There is no abnormal metabolic activity within the spleen, pancreas or adrenal glands. There is no ascites or generalized peritoneal activity. There is prominent bowel activity without focal abnormality, within physiologic limits. Incidental CT findings: Underlying hepatic steatosis. As correlated with previous abdominal CT, there is intrahepatic discontinuation of the intrahepatic IVC with azygos vein continuation (normal variant). SKELETON: There is a small focus of hypermetabolic activity projecting over the central left hip in the region of the femoral head fovea (SUV max 5.4). This is likely incidental. No other osseous activity suspicious for metastatic disease. Incidental CT findings: Mild spondylosis. IMPRESSION: 1. Hypermetabolic lesion in the anterior inferior right hepatic lobe (segment 5), corresponding with the abnormality on previous CT, suspicious for malignancy. Less intense activity in the cholecystectomy bed is nonspecific. 2. Additional hypermetabolic activity in the porta hepatis, not well localized. This may correspond with adenopathy or an ampullary lesion. 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 Consider further evaluation with abdominal MRI without and with contrast. 3. No evidence of peritoneal carcinomatosis or distant metastases. Electronically Signed   By: Richardean Sale M.D.   On: 07/14/2020 10:14    ASSESSMENT: Peritoneal carcinomatosis.  PLAN:    1. Peritoneal carcinomatosis: Incidental finding on routine cholecystectomy.  CT scan of the abdomen pelvis did not reveal a distinct primary.  Colonoscopy on June 15, 2020 was essentially within normal limits.  PET scan results from  September 12, 2020 reviewed independently and report as above with a hypermetabolic lesion in the anterior inferior right hepatic lobe suspicious for underlying malignancy.  She has has a pelvic ultrasound and follow-up with OB/GYN on July 18, 2020.  Mammogram is scheduled for July 19, 2020.  MRI of the liver is scheduled for July 20, 2020.  Pathology was sent to [facility] for expert review and is still pending at time of dictation.  All patient tumor markers are negative.  Will discuss further at tumor board next week.  Return to clinic on July 24, 2020 to discuss her final pathology results, imaging results, and possible treatment planning.  I spent a total of 30 minutes reviewing chart data, face-to-face evaluation with the patient, counseling and coordination of care as detailed above.  Patient expressed understanding and was in agreement with this plan. She also understands that She can call clinic at any time with any questions, concerns, or complaints.   Cancer Staging No matching staging information was found for the patient.  Lloyd Huger, MD   07/14/2020 12:14 PM

## 2020-07-10 ENCOUNTER — Telehealth: Payer: Self-pay | Admitting: Gastroenterology

## 2020-07-10 LAB — CYTOLOGY - PAP
Comment: NEGATIVE
Diagnosis: NEGATIVE
High risk HPV: NEGATIVE

## 2020-07-10 NOTE — Telephone Encounter (Signed)
We were actually able to get the MRI approved and scheduled last week with submitting diagnosis, patient is scheduled for MRI on 10/28. Thanks.

## 2020-07-10 NOTE — Telephone Encounter (Signed)
[place], thanks!  I see her on 10/22 after the PET.  -Tim

## 2020-07-10 NOTE — Telephone Encounter (Signed)
Patient's insurance denied approval for MRI liver mass protocol as the CT was done less than 3 months ago.  We will try for an appeal to consider with new diagnosis of peritoneal carcinomatosis with unknown primary in order to expedite the MRI abdomen as an exception  Patient is scheduled to undergo whole-body PET scan on 07/13/2020  Cephas Darby, MD [place]  [place], [place] [address]  Main: [phone]  Fax: [phone] Pager: [phone]

## 2020-07-11 ENCOUNTER — Other Ambulatory Visit: Payer: Self-pay

## 2020-07-11 ENCOUNTER — Ambulatory Visit (INDEPENDENT_AMBULATORY_CARE_PROVIDER_SITE_OTHER): Payer: BC Managed Care – PPO | Admitting: Surgery

## 2020-07-11 ENCOUNTER — Encounter: Payer: Self-pay | Admitting: Surgery

## 2020-07-11 VITALS — BP 127/76 | HR 73 | Temp 98.0°F | Resp 12 | Ht 65.0 in | Wt 167.0 lb

## 2020-07-11 DIAGNOSIS — C786 Secondary malignant neoplasm of retroperitoneum and peritoneum: Secondary | ICD-10-CM

## 2020-07-11 NOTE — Patient Instructions (Addendum)
Please call our office if you have questions or concerns.     GENERAL POST-OPERATIVE PATIENT INSTRUCTIONS   WOUND CARE INSTRUCTIONS:  Keep a dry clean dressing on the wound if there is drainage. The initial bandage may be removed after 24 hours.  Once the wound has quit draining you may leave it open to air.  If clothing rubs against the wound or causes irritation and the wound is not draining you may cover it with a dry dressing during the daytime.  Try to keep the wound dry and avoid ointments on the wound unless directed to do so.  If the wound becomes bright red and painful or starts to drain infected material that is not clear, please contact your physician immediately.  If the wound is mildly pink and has a thick firm ridge underneath it, this is normal, and is referred to as a healing ridge.  This will resolve over the next 4-6 weeks.  BATHING: You may shower if you have been informed of this by your surgeon. However, Please do not submerge in a tub, hot tub, or pool until incisions are completely sealed or have been told by your surgeon that you may do so.  DIET:  You may eat any foods that you can tolerate.  It is a good idea to eat a high fiber diet and take in plenty of fluids to prevent constipation.  If you do become constipated you may want to take a mild laxative or take ducolax tablets on a daily basis until your bowel habits are regular.  Constipation can be very uncomfortable, along with straining, after recent surgery.  ACTIVITY:  You are encouraged to cough and deep breath or use your incentive spirometer if you were given one, every 15-30 minutes when awake.  This will help prevent respiratory complications and low grade fevers post-operatively if you had a general anesthetic.  You may want to hug a pillow when coughing and sneezing to add additional support to the surgical area, if you had abdominal or chest surgery, which will decrease pain during these times.  You are encouraged  to walk and engage in light activity for the next two weeks.  You should not lift more than 20 pounds, until 07/12/2020 as it could put you at increased risk for complications.  Twenty pounds is roughly equivalent to a plastic bag of groceries. At that time- Listen to your body when lifting, if you have pain when lifting, stop and then try again in a few days. Soreness after doing exercises or activities of daily living is normal as you get back in to your normal routine.  MEDICATIONS:  Try to take narcotic medications and anti-inflammatory medications, such as tylenol, ibuprofen, naprosyn, etc., with food.  This will minimize stomach upset from the medication.  Should you develop nausea and vomiting from the pain medication, or develop a rash, please discontinue the medication and contact your physician.  You should not drive, make important decisions, or operate machinery when taking narcotic pain medication.  SUNBLOCK Use sun block to incision area over the next year if this area will be exposed to sun. This helps decrease scarring and will allow you avoid a permanent darkened area over your incision.  QUESTIONS:  Please feel free to call our office if you have any questions, and we will be glad to assist you. [phone]

## 2020-07-11 NOTE — Addendum Note (Signed)
Addended by: Ardeth Perfect B on: 50/72/2575 01:55 PM   Modules accepted: Orders

## 2020-07-11 NOTE — Progress Notes (Signed)
Dx mammo bilat changed to Dx mammo LT per [place]. Dr. Glennon Mac out of the office.  Nalayah Hitt, PA-C

## 2020-07-11 NOTE — Progress Notes (Signed)
[facility] POST-OP OFFICE VISIT  07/11/2020  HPI: Lauren Lindsey is a 59 y.o. female 40 days s/p robotic cholecystectomy with biopsy of peritoneal carcinomatosis implant.  She currently has approval for proceeding with hepatic MRI, and PET CT scanning.  After visiting with her gynecologist who proceeded with ordering additional mammography/axillary imaging.  These are all pending.  As of now she is doing well, good appetite, regaining her weight.  Vital signs: BP 127/76   Pulse 73   Temp 98 F (36.7 C) (Oral)   Resp 12   Ht 5\' 5"  (1.651 m)   Wt 167 lb (75.8 kg)   LMP 06/26/2019   SpO2 97%   BMI 27.79 kg/m    Physical Exam: Constitutional: She appears well.  She is more concerned about issues with returning to work and potentially losing her job considering all the additional doctors visits upcoming. Abdomen: Benign, soft nontender. Skin: All incisions are well-healed.  Assessment/Plan: This is a 59 y.o. female 40 days s/p robotic cholecystectomy, with biopsy of carcinomatosis implanted on surface of liver.  Patient Active Problem List   Diagnosis Date Noted  . Status post laparoscopic cholecystectomy 06/08/2020  . Peritoneal carcinomatosis ([place]) 05/31/2020  . 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 Chronic venous insufficiency 08/05/2016  . Diabetes mellitus type 2, uncomplicated ([place]) 08/08/5207    -I anticipate she plans to follow-up with her imaging, and her current physicians appointments.  I will continue to follow her peripherally and be of assistance in any way I can.  Otherwise no routine scheduled follow-ups with me.   Ronny Bacon M.D., FACS 07/11/2020, 10:03 AM

## 2020-07-13 ENCOUNTER — Other Ambulatory Visit: Payer: Self-pay

## 2020-07-13 ENCOUNTER — Ambulatory Visit: Payer: BC Managed Care – PPO | Admitting: Oncology

## 2020-07-13 ENCOUNTER — Encounter
Admission: RE | Admit: 2020-07-13 | Discharge: 2020-07-13 | Disposition: A | Discharge status: Home / Self Care | Payer: BC Managed Care – PPO | Source: Ambulatory Visit | Attending: Oncology | Admitting: Oncology

## 2020-07-13 DIAGNOSIS — C786 Secondary malignant neoplasm of retroperitoneum and peritoneum: Secondary | ICD-10-CM | POA: Diagnosis present

## 2020-07-13 LAB — GLUCOSE, CAPILLARY
Glucose-Capillary: 66 mg/dL — ABNORMAL LOW (ref 70–99)
Glucose-Capillary: 63 mg/dL — ABNORMAL LOW (ref 70–99)

## 2020-07-13 MED ORDER — FLUDEOXYGLUCOSE F - 18 (FDG) INJECTION
8.7000 | Freq: Once | INTRAVENOUS | Status: AC | PRN
  Administered 2020-07-13: 9.02 via INTRAVENOUS

## 2020-07-14 ENCOUNTER — Encounter: Payer: Self-pay | Admitting: Oncology

## 2020-07-14 ENCOUNTER — Inpatient Hospital Stay: Payer: BC Managed Care – PPO | Attending: Oncology | Admitting: Oncology

## 2020-07-14 VITALS — BP 101/66 | HR 66 | Temp 97.3°F | Resp 16 | Wt 167.8 lb

## 2020-07-14 DIAGNOSIS — C786 Secondary malignant neoplasm of retroperitoneum and peritoneum: Secondary | ICD-10-CM | POA: Diagnosis not present

## 2020-07-14 DIAGNOSIS — E119 Type 2 diabetes mellitus without complications: Secondary | ICD-10-CM | POA: Insufficient documentation

## 2020-07-14 DIAGNOSIS — Z7984 Long term (current) use of oral hypoglycemic drugs: Secondary | ICD-10-CM | POA: Diagnosis not present

## 2020-07-17 ENCOUNTER — Telehealth: Payer: Self-pay | Admitting: *Deleted

## 2020-07-17 NOTE — Telephone Encounter (Signed)
Faxed to Marcie [employer] Younger return to work authorization at [phone]

## 2020-07-18 ENCOUNTER — Ambulatory Visit (INDEPENDENT_AMBULATORY_CARE_PROVIDER_SITE_OTHER): Payer: BC Managed Care – PPO

## 2020-07-18 ENCOUNTER — Ambulatory Visit (INDEPENDENT_AMBULATORY_CARE_PROVIDER_SITE_OTHER): Payer: BC Managed Care – PPO | Admitting: Obstetrics and Gynecology

## 2020-07-18 ENCOUNTER — Other Ambulatory Visit: Payer: Self-pay

## 2020-07-18 ENCOUNTER — Encounter: Payer: Self-pay | Admitting: Obstetrics and Gynecology

## 2020-07-18 VITALS — BP 120/80 | Ht 65.0 in | Wt 167.0 lb

## 2020-07-18 DIAGNOSIS — C786 Secondary malignant neoplasm of retroperitoneum and peritoneum: Secondary | ICD-10-CM | POA: Diagnosis not present

## 2020-07-18 DIAGNOSIS — Z01419 Encounter for gynecological examination (general) (routine) without abnormal findings: Secondary | ICD-10-CM

## 2020-07-18 NOTE — Progress Notes (Signed)
Gynecology Ultrasound Follow Up  Chief Complaint:  Chief Complaint  Patient presents with  . Follow-up    u/s  assess for primary for finding of peritoneal carcinomatosis of unknown primary.   History of Present Illness: Patient is a 59 y.o. female who presents today for ultrasound evaluation of the above .  Ultrasound demonstrates the following findings Adnexa: no masses seen  Uterus: anteverted with endometrial stripe  4.4 mm Additional: two complex Nabothian cysts, measuring 13.8 x 11.6 x 10.9 mm and 9.2 x 7.8 x 9.5 mm with echogenic foci within each with blood flow in the foci.  The echogenic foci measure 7.8 x 5.7 x 4.2 and 6.1 x 5.9 x 4.3 mm, respectively.   She had a negative pap smear with negative HPV. I was unable to cannulate her cervix to obtain an endometrial biopsy.   She has had no menopausal bleeding or any other symptoms.   Past Medical History:  Diagnosis Date  . Abnormal Pap smear of cervix   . Anemia    h/o  . Diabetes mellitus without complication [facility])     Past Surgical History:  Procedure Laterality Date  . COLONOSCOPY WITH PROPOFOL N/A 06/15/2020   Procedure: COLONOSCOPY WITH PROPOFOL;  Surgeon: Lin Landsman, MD;  Location: [facility];  Service: Endoscopy;  Laterality: N/A;  Diabetic - oral meds  . ENDOMETRIAL ABLATION    . HYSTEROSCOPY WITH D & C    . LEEP     Family History  Problem Relation Age of Onset  . Bleeding Disorder Mother   . Biliary Cirrhosis Mother   . Liver cancer Mother   . Hypertension Mother   . Diabetes Father   . Lung cancer Father   . Breast cancer Neg Hx     Social History   Socioeconomic History  . Marital status: Married    Spouse name: Not on file  . Number of children: Not on file  . Years of education: Not on file  . Highest education level: Not on file  Occupational History  . Not on file  Tobacco Use  . Smoking status: Never Smoker  . Smokeless tobacco: Never Used  Vaping Use  .  Vaping Use: Never used  Substance and Sexual Activity  . Alcohol use: No  . Drug use: No  . Sexual activity: Yes    Birth control/protection: Post-menopausal  Other Topics Concern  . Not on file  Social History Narrative  . Not on file   Social Determinants of Health   Financial Resource Strain:   . Difficulty of Paying Living Expenses: Not on file  Food Insecurity:   . Worried About Charity fundraiser in the Last Year: Not on file  . Ran Out of Food in the Last Year: Not on file  Transportation Needs:   . Lack of Transportation (Medical): Not on file  . Lack of Transportation (Non-Medical): Not on file  Physical Activity:   . Days of Exercise per Week: Not on file  . Minutes of Exercise per Session: Not on file  Stress:   . Feeling of Stress : Not on file  Social Connections:   . Frequency of Communication with Friends and Family: Not on file  . Frequency of Social Gatherings with Friends and Family: Not on file  . Attends Religious Services: Not on file  . Active Member of Clubs or Organizations: Not on file  . Attends Archivist Meetings: Not on file  .  Marital Status: Not on file  Intimate Partner Violence:   . Fear of Current or Ex-Partner: Not on file  . Emotionally Abused: Not on file  . Physically Abused: Not on file  . Sexually Abused: Not on file    No Known Allergies  Prior to Admission medications   Medication Sig Start Date End Date Taking? Authorizing Provider  metFORMIN (GLUCOPHAGE) 500 MG tablet Take 500 mg by mouth 2 (two) times daily with a meal.  06/21/19  Yes [provider]    Physical Exam BP 120/80   Ht 5\' 5"  (1.651 m)   Wt 167 lb (75.8 kg)   LMP 06/26/2019   BMI 27.79 kg/m    General: NAD HEENT: normocephalic, anicteric Pulmonary: No increased work of breathing Extremities: no edema, erythema, or tenderness Neurologic: Grossly intact, normal gait Psychiatric: mood appropriate, affect full  Imaging Results US  PELVIS TRANSVAGINAL NON-OB (TV ONLY)  Result Date: 07/18/2020 Patient Name: Lauren Lindsey DOB: May 08, 1961 MRN: [id number] ULTRASOUND REPORT Location: [facility] Date of Service: 07/18/2020 Indications:R/O pelvic mass Findings: The uterus is anteverted and measures 6.4 x 4.8 x 3.9 cm. Echo texture is heterogenous without evidence of focal masses. The Endometrium measures 4.4 mm. There are two complex nabothian cysts in the cervix. They measure 13.8 x 11.6 x 10.9 mm and 9.2 x 7.8 x 9.5 mm. Both cysts have an echogenic focus within with blood flow. The echogenic foci measure 7.8 x 5.7 x 4.2 mm and 6.1 x 5.9 x 4.3 mm respectively. Right Ovary measures 2.8 x 1.8 x 1.5 cm. It is normal in appearance. Left Ovary measures 3.9 x 2.1 x 1.8 cm. It is normal in appearance. Survey of the adnexa demonstrates no adnexal masses. There is no free fluid in the cul de sac. Impression: 1. There are two complex cysts in the cervix with blood flow. 2. The uterus is heterogeneous but no obvious fibroids are seen. 3. The endometrium appears fairly thin. 4. Normal appearing ovaries. [name], RT The ultrasound images and findings were reviewed by me and I agree with the above report. [name], MD, [name], [facility] 07/18/2020 9:09 AM       Assessment: 59 y.o. G0P0000  1. Peritoneal carcinomatosis [facility])      Plan: Problem List Items Addressed This Visit      Other   Peritoneal carcinomatosis ([place]) - Primary     Discussed findings with patient and her husband.  Nabothian cyst with echogenic focus of uncertain significance.  I discussed this case, with permission from the patient, with Gynecologic Oncologist Santiago Glad, MD, of [facility].  Her recommendation was to await final pathology without further gynecologic intervention at this time. If final pathology is concerning for gynecologic malignancy, refer to Gynecologic Oncology for further evaluation.  A total of 20 minutes  were spent face-to-face with the patient as well as preparation, review, communication, and documentation during this encounter.     [name], MD, [name], [facility] 07/19/2020 8:33 AM    CC: Delight Hoh, MD [place], [place]

## 2020-07-19 ENCOUNTER — Encounter: Payer: Self-pay | Admitting: Obstetrics and Gynecology

## 2020-07-19 ENCOUNTER — Ambulatory Visit
Admission: RE | Admit: 2020-07-19 | Discharge: 2020-07-19 | Disposition: A | Discharge status: Home / Self Care | Payer: BC Managed Care – PPO | Source: Ambulatory Visit | Attending: Obstetrics and Gynecology | Admitting: Obstetrics and Gynecology

## 2020-07-19 DIAGNOSIS — K769 Liver disease, unspecified: Secondary | ICD-10-CM | POA: Insufficient documentation

## 2020-07-19 DIAGNOSIS — R2232 Localized swelling, mass and lump, left upper limb: Secondary | ICD-10-CM

## 2020-07-20 ENCOUNTER — Ambulatory Visit
Admission: RE | Admit: 2020-07-20 | Discharge: 2020-07-20 | Disposition: A | Discharge status: Home / Self Care | Payer: BC Managed Care – PPO | Source: Ambulatory Visit | Attending: Oncology | Admitting: Oncology

## 2020-07-20 ENCOUNTER — Other Ambulatory Visit: Payer: Self-pay

## 2020-07-20 DIAGNOSIS — K769 Liver disease, unspecified: Secondary | ICD-10-CM | POA: Diagnosis not present

## 2020-07-20 MED ORDER — GADOBUTROL 1 MMOL/ML IV SOLN
7.5000 mL | Freq: Once | INTRAVENOUS | Status: AC | PRN
  Administered 2020-07-20: 7.5 mL via INTRAVENOUS

## 2020-07-20 NOTE — Progress Notes (Signed)
I had to ok order while you were gone. Your pt.

## 2020-07-21 ENCOUNTER — Encounter: Payer: Self-pay | Admitting: Oncology

## 2020-07-21 NOTE — Progress Notes (Signed)
Spoke to patient's husband during pre assessment. He denied any pain or concerns at this time. Just anxiousness to get results from test.

## 2020-07-22 NOTE — Progress Notes (Signed)
[place]  [phone] [phone]  ID: Lauren Lindsey OB: March 06, 1961  MR#: [id number]  AOZ#:[id number]  Patient Care Team: Lauren Hartigan, MD as PCP - General (Family Medicine) Patient, No Pcp Per (General Practice) Lauren Jacks, RN as Oncology Nurse Navigator  CHIEF COMPLAINT: Peritoneal carcinomatosis.  INTERVAL HISTORY: Patient returns to clinic today for further evaluation and discussion of her imaging results.  She continues to feel well and remains asymptomatic. She has no neurologic complaints.  She denies any recent fevers or illnesses.  She has a good appetite and denies weight loss.  She has no chest pain, shortness of breath, cough, or hemoptysis. Her abdominal pain resolved with her cholecystectomy.  She denies any nausea, vomiting, constipation, or diarrhea.  She has no melena or hematochezia.  She has no urinary complaints.  Patient feels at her baseline offers no specific complaints today.  REVIEW OF SYSTEMS:   Review of Systems  Constitutional: Negative.  Negative for fever, malaise/fatigue and weight loss.  Respiratory: Negative.  Negative for cough, hemoptysis and shortness of breath.   Cardiovascular: Negative.  Negative for chest pain and leg swelling.  Gastrointestinal: Negative.  Negative for abdominal pain, blood in stool, constipation, diarrhea, melena, nausea and vomiting.  Genitourinary: Negative.  Negative for dysuria.  Musculoskeletal: Negative.   Skin: Negative.  Negative for rash.  Neurological: Negative.  Negative for dizziness, focal weakness, weakness and headaches.  Psychiatric/Behavioral: Negative.  The patient is not nervous/anxious.     As per HPI. Otherwise, a complete review of systems is negative.  PAST MEDICAL HISTORY: Past Medical History:  Diagnosis Date  . Abnormal Pap smear of cervix   . Anemia    h/o  . Diabetes mellitus without complication ([place])     PAST SURGICAL HISTORY: Past  Surgical History:  Procedure Laterality Date  . COLONOSCOPY WITH PROPOFOL N/A 06/15/2020   Procedure: COLONOSCOPY WITH PROPOFOL;  Surgeon: Lin Landsman, MD;  Location: [facility];  Service: Endoscopy;  Laterality: N/A;  Diabetic - oral meds  . ENDOMETRIAL ABLATION    . HYSTEROSCOPY WITH D & C    . LEEP      FAMILY HISTORY: Family History  Problem Relation Age of Onset  . Bleeding Disorder Mother   . Biliary Cirrhosis Mother   . Liver cancer Mother   . Hypertension Mother   . Diabetes Father   . Lung cancer Father   . Breast cancer Neg Hx     ADVANCED DIRECTIVES (Y/N):  N  HEALTH MAINTENANCE: Social History   Tobacco Use  . Smoking status: Never Smoker  . Smokeless tobacco: Never Used  Vaping Use  . Vaping Use: Never used  Substance Use Topics  . Alcohol use: No  . Drug use: No     Colonoscopy:  PAP:  Bone density:  Lipid panel:  No Known Allergies  Current Outpatient Medications  Medication Sig Dispense Refill  . metFORMIN (GLUCOPHAGE) 500 MG tablet Take 500 mg by mouth 2 (two) times daily with a meal.      No current facility-administered medications for this visit.    OBJECTIVE: Vitals:   07/24/20 0848  BP: 124/61  Pulse: 77  Resp: 20  Temp: (!) 97.4 F (36.3 C)  SpO2: 99%     Body mass index is 28.06 kg/m.    ECOG FS:0 - Asymptomatic  General: Well-developed, well-nourished, no acute distress. Eyes: Pink conjunctiva, anicteric sclera. HEENT: Normocephalic, moist mucous membranes. Lungs: No audible  wheezing or coughing. Heart: Regular rate and rhythm. Abdomen: Soft, nontender, no obvious distention. Musculoskeletal: No edema, cyanosis, or clubbing. Neuro: Alert, answering all questions appropriately. Cranial nerves grossly intact. Skin: No rashes or petechiae noted. Psych: Normal affect.   LAB RESULTS:  Lab Results  Component Value Date   NA 139 05/23/2020   K 3.6 05/23/2020   CL 106 05/23/2020   CO2 26 05/23/2020    GLUCOSE 91 05/23/2020   BUN 24 (H) 05/23/2020   CREATININE 0.74 05/23/2020   CALCIUM 8.6 (L) 05/23/2020   PROT 7.9 05/23/2020   ALBUMIN 4.2 05/23/2020   AST 50 (H) 05/23/2020   ALT 74 (H) 05/23/2020   ALKPHOS 52 05/23/2020   BILITOT 1.0 05/23/2020   GFRNONAA >60 05/23/2020   GFRAA >60 05/23/2020    Lab Results  Component Value Date   WBC 10.9 (H) 05/23/2020   HGB 11.3 (L) 05/23/2020   HCT 35.6 (L) 05/23/2020   MCV 80.2 05/23/2020   PLT 401 (H) 05/23/2020     STUDIES: US PELVIS TRANSVAGINAL NON-OB (TV ONLY)  Result Date: 07/18/2020 Patient Name: Lauren Lindsey DOB: 1961/08/20 MRN: [id number] ULTRASOUND REPORT Location: [facility] Date of Service: 07/18/2020 Indications:R/O pelvic mass Findings: The uterus is anteverted and measures 6.4 x 4.8 x 3.9 cm. Echo texture is heterogenous without evidence of focal masses. The Endometrium measures 4.4 mm. There are two complex nabothian cysts in the cervix. They measure 13.8 x 11.6 x 10.9 mm and 9.2 x 7.8 x 9.5 mm. Both cysts have an echogenic focus within with blood flow. The echogenic foci measure 7.8 x 5.7 x 4.2 mm and 6.1 x 5.9 x 4.3 mm respectively. Right Ovary measures 2.8 x 1.8 x 1.5 cm. It is normal in appearance. Left Ovary measures 3.9 x 2.1 x 1.8 cm. It is normal in appearance. Survey of the adnexa demonstrates no adnexal masses. There is no free fluid in the cul de sac. Impression: 1. There are two complex cysts in the cervix with blood flow. 2. The uterus is heterogeneous but no obvious fibroids are seen. 3. The endometrium appears fairly thin. 4. Normal appearing ovaries. Lauren Lindsey, RT The ultrasound images and findings were reviewed by me and I agree with the above report. Lauren Docker, MD, [facility], [facility] 07/18/2020 9:09 AM     MR LIVER W WO CONTRAST  Result Date: 07/21/2020 CLINICAL DATA:  59 year old female with history of indeterminate liver lesion noted on prior CT examination,  which demonstrated hypermetabolism on prior PET-CT. Follow-up study. EXAM: MRI ABDOMEN WITHOUT AND WITH CONTRAST TECHNIQUE: Multiplanar multisequence MR imaging of the abdomen was performed both before and after the administration of intravenous contrast. CONTRAST:  7.66mL GADAVIST GADOBUTROL 1 MMOL/ML IV SOLN COMPARISON:  No prior abdominal MRI. CT the abdomen and pelvis 06/16/2020. PET-CT 07/13/2020. FINDINGS: Lower chest: In the lateral segment of the right middle lobe there is a 9 mm nodule (axial image 5 of series 15), corresponding to calcified granuloma demonstrated on prior CT the abdomen and pelvis 06/16/2020. Hepatobiliary: Diffuse loss of signal intensity throughout the hepatic parenchyma on out of phase dual echo images, indicative of a background of hepatic steatosis. In the right lobe of the liver, predominantly occupying portions of segments 5 and 8, there are ill-defined areas that are very poorly demonstrated on precontrast T1 weighted images ranging from mildly hypo to mildly hyperintense, subtly T2 hyperintense, with internal areas of diffusion restriction and heterogeneous enhancement on post gadolinium  imaging. The borders of this region are somewhat indistinct, with an appearance suggestive of multiple adjacent lesions which are now contiguous with one another. Because of the indistinctness of the lesions, accurate measurement is challenging but the bulkiest portions of this lesion or lesions is estimated to measure approximately 5.8 x 3.5 x 7.2 cm (axial image 58 of series 11 and coronal image 40 of series 17). No intra or extrahepatic biliary ductal dilatation. Status post cholecystectomy. Pancreas: No pancreatic mass. No pancreatic ductal dilatation. No pancreatic or peripancreatic fluid collections or inflammatory changes. Spleen:  Unremarkable. Adrenals/Urinary Tract: Bilateral kidneys and adrenal glands are unremarkable in appearance. No hydroureteronephrosis in the visualized portions of  the abdomen. Stomach/Bowel: Visualized portions are unremarkable. Vascular/Lymphatic: No aneurysm identified in the visualized abdominal vasculature. Several prominent lymph nodes are noted adjacent to the porta hepatis within the portacaval nodal stations, largest of which measures up to 1.1 cm in short axis. Notably, all of these lymph nodes demonstrate diffusion restriction, and correspond to areas of hypermetabolism on the recent PET-CT, concerning for metastatic lymph nodes. Other: No significant volume of ascites noted in the visualized portions of the peritoneal cavity. Musculoskeletal: No aggressive appearing osseous lesions are noted in the visualized portions of the skeleton. IMPRESSION: 1. Poorly characterized aggressive appearing infiltrative neoplasm in the right lobe of the liver occupying predominantly segments 5 and 8, with associated portacaval lymphadenopathy, as detailed above. Findings are highly concerning for malignancy, and correlation with tissue sampling is recommended to establish a tissue diagnosis. 2. Hepatic steatosis. Electronically Signed   By: Vinnie Langton M.D.   On: 07/21/2020 08:06   NM PET Image Initial (PI) Skull Base To Thigh  Result Date: 07/14/2020 CLINICAL DATA:  Initial treatment strategy for peritoneal carcinomatosis. Incidental peritoneal implant discovered during cholecystectomy. EXAM: NUCLEAR MEDICINE PET SKULL BASE TO THIGH TECHNIQUE: 9.02 mCi F-18 FDG was injected intravenously. Full-ring PET imaging was performed from the skull base to thigh after the radiotracer. CT data was obtained and used for attenuation correction and anatomic localization. Fasting blood glucose: 66 mg/dl COMPARISON:  Abdominopelvic CT 06/16/2020. Abdominal ultrasound 05/23/2020. FINDINGS: Mediastinal blood pool activity: SUV max 2.4 NECK: No hypermetabolic cervical lymph nodes are identified.There are no lesions of the pharyngeal mucosal space. There is physiologic metabolic activity  within the lymphoid tissue of Waldeyer's ring. Incidental CT findings: none CHEST: There are no hypermetabolic mediastinal, hilar or axillary lymph nodes. No hypermetabolic pulmonary activity or suspicious pulmonary nodularity. There is a calcified right middle lobe granuloma. Incidental CT findings: Dilated azygos vein. ABDOMEN/PELVIS: There is prominent hypermetabolic activity within the cholecystectomy bed with inferolateral extension in the right hepatic lobe towards the abnormality seen on previous CT. The hepatic lesion is not well seen on this noncontrast study, although measures approximately 2.5 cm on image 156/3. This demonstrates an SUV max of 8.3, suspicious for malignancy. There is also focal hypermetabolic activity in porta hepatis (SUV max 6.2) which may relate to an ampullary lesion or lymph node. There is an additional (5 mm) lymph node in the gastrohepatic ligament which is mildly hypermetabolic for size (SUV max 4.0). No other hypermetabolic abdominopelvic lymph nodes are identified. There is no abnormal metabolic activity within the spleen, pancreas or adrenal glands. There is no ascites or generalized peritoneal activity. There is prominent bowel activity without focal abnormality, within physiologic limits. Incidental CT findings: Underlying hepatic steatosis. As correlated with previous abdominal CT, there is intrahepatic discontinuation of the intrahepatic IVC with azygos vein continuation (normal variant). SKELETON:  There is a small focus of hypermetabolic activity projecting over the central left hip in the region of the femoral head fovea (SUV max 5.4). This is likely incidental. No other osseous activity suspicious for metastatic disease. Incidental CT findings: Mild spondylosis. IMPRESSION: 1. Hypermetabolic lesion in the anterior inferior right hepatic lobe (segment 5), corresponding with the abnormality on previous CT, suspicious for malignancy. Less intense activity in the  cholecystectomy bed is nonspecific. 2. Additional hypermetabolic activity in the porta hepatis, not well localized. This may correspond with adenopathy or an ampullary lesion. 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 Consider further evaluation with abdominal MRI without and with contrast. 3. No evidence of peritoneal carcinomatosis or distant metastases. Electronically Signed   By: Richardean Sale M.D.   On: 07/14/2020 10:14   US BREAST LTD UNI LEFT INC AXILLA  Result Date: 07/19/2020 CLINICAL DATA:  Patient with incidentally diagnosed peritoneal carcinomatosis of uncertain primary. Provider appreciates LEFT axillary adenopathy. EXAM: DIGITAL DIAGNOSTIC UNILATERAL LEFT MAMMOGRAM WITH TOMO AND CAD; ULTRASOUND LEFT BREAST LIMITED COMPARISON:  Previous exam(s). ACR Breast Density Category b: There are scattered areas of fibroglandular density. FINDINGS: Tomosynthesis views were obtained over the palpable area of concern in the the LEFT axillary breast. No suspicious mammographic finding is identified in this area. No suspicious mass, microcalcification, or other finding is identified in the LEFT breast. Mammographic images were processed with CAD. On physical exam, no suspicious mass is appreciated. Targeted LEFT breast ultrasound was performed in the palpable area of concern at the LEFT axilla. No suspicious solid or cystic mass is identified. Benign appearing lymph nodes with thin smooth cortices are seen. IMPRESSION: No mammographic or sonographic evidence of malignancy in the LEFT axilla. Benign lymph nodes are seen. RECOMMENDATION: Recommend return to screening mammography as clinically indicated. This is due in January of 2022. I have discussed the findings and recommendations with the patient. If applicable, a reminder letter will be sent to the patient regarding the next appointment. BI-RADS CATEGORY  2: Benign.  Electronically Signed   By: Valentino Saxon MD   On: 07/19/2020 14:04   MM DIAG BREAST TOMO UNI LEFT  Result Date: 07/19/2020 CLINICAL DATA:  Patient with incidentally diagnosed peritoneal carcinomatosis of uncertain primary. Provider appreciates LEFT axillary adenopathy. EXAM: DIGITAL DIAGNOSTIC UNILATERAL LEFT MAMMOGRAM WITH TOMO AND CAD; ULTRASOUND LEFT BREAST LIMITED COMPARISON:  Previous exam(s). ACR Breast Density Category b: There are scattered areas of fibroglandular density. FINDINGS: Tomosynthesis views were obtained over the palpable area of concern in the the LEFT axillary breast. No suspicious mammographic finding is identified in this area. No suspicious mass, microcalcification, or other finding is identified in the LEFT breast. Mammographic images were processed with CAD. On physical exam, no suspicious mass is appreciated. Targeted LEFT breast ultrasound was performed in the palpable area of concern at the LEFT axilla. No suspicious solid or cystic mass is identified. Benign appearing lymph nodes with thin smooth cortices are seen. IMPRESSION: No mammographic or sonographic evidence of malignancy in the LEFT axilla. Benign lymph nodes are seen. RECOMMENDATION: Recommend return to screening mammography as clinically indicated. This is due in January of 2022. I have discussed the findings and recommendations with the patient. If applicable, a reminder letter will be sent to the patient regarding the next appointment. BI-RADS CATEGORY  2: Benign. Electronically Signed   By: Valentino Saxon MD   On: 07/19/2020 14:04    ASSESSMENT: Peritoneal carcinomatosis.  PLAN:  1. Peritoneal carcinomatosis: Incidental finding on routine cholecystectomy.  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  Gynecologic work-up is  essentially negative.  Colonoscopy on June 15, 2020 was essentially within normal limits.  Mammogram on July 19, 2020 was reported as BI-RADS 2.   Pathology was sent to [facility] for expert review and this continues to remain pending.  All patient tumor markers are negative. Will pursue ultrasound-guided biopsy of liver lesion to confirm the diagnosis.  Patient will then follow-up 3 to 4 days later to discuss the results and treatment planning.  I spent a total of 30 minutes reviewing chart data, face-to-face evaluation with the patient, counseling and coordination of care as detailed above.  Patient expressed understanding and was in agreement with this plan. She also understands that She can call clinic at any time with any questions, concerns, or complaints.   Cancer Staging No matching staging information was found for the patient.  Lloyd Huger, MD   07/24/2020 10:48 AM

## 2020-07-24 ENCOUNTER — Encounter: Payer: Self-pay | Admitting: Oncology

## 2020-07-24 ENCOUNTER — Inpatient Hospital Stay: Payer: BC Managed Care – PPO | Attending: Oncology | Admitting: Oncology

## 2020-07-24 ENCOUNTER — Encounter: Payer: Self-pay | Admitting: *Deleted

## 2020-07-24 ENCOUNTER — Other Ambulatory Visit: Payer: Self-pay

## 2020-07-24 VITALS — BP 124/61 | HR 77 | Temp 97.4°F | Resp 20 | Wt 168.6 lb

## 2020-07-24 DIAGNOSIS — Z800 Family history of malignant neoplasm of digestive organs: Secondary | ICD-10-CM | POA: Insufficient documentation

## 2020-07-24 DIAGNOSIS — Z7984 Long term (current) use of oral hypoglycemic drugs: Secondary | ICD-10-CM | POA: Insufficient documentation

## 2020-07-24 DIAGNOSIS — Z801 Family history of malignant neoplasm of trachea, bronchus and lung: Secondary | ICD-10-CM | POA: Insufficient documentation

## 2020-07-24 DIAGNOSIS — C482 Malignant neoplasm of peritoneum, unspecified: Secondary | ICD-10-CM | POA: Insufficient documentation

## 2020-07-24 DIAGNOSIS — C786 Secondary malignant neoplasm of retroperitoneum and peritoneum: Secondary | ICD-10-CM

## 2020-07-24 DIAGNOSIS — K769 Liver disease, unspecified: Secondary | ICD-10-CM | POA: Diagnosis not present

## 2020-07-24 DIAGNOSIS — K760 Fatty (change of) liver, not elsewhere classified: Secondary | ICD-10-CM | POA: Insufficient documentation

## 2020-07-24 DIAGNOSIS — E119 Type 2 diabetes mellitus without complications: Secondary | ICD-10-CM | POA: Insufficient documentation

## 2020-07-25 ENCOUNTER — Encounter: Payer: Self-pay | Admitting: *Deleted

## 2020-07-26 ENCOUNTER — Other Ambulatory Visit: Payer: Self-pay | Admitting: Radiology

## 2020-07-26 LAB — SURGICAL PATHOLOGY

## 2020-07-27 ENCOUNTER — Ambulatory Visit
Admission: RE | Admit: 2020-07-27 | Discharge: 2020-07-27 | Disposition: A | Discharge status: Home / Self Care | Payer: BC Managed Care – PPO | Source: Ambulatory Visit | Attending: Oncology | Admitting: Oncology

## 2020-07-27 ENCOUNTER — Other Ambulatory Visit: Payer: Self-pay

## 2020-07-27 ENCOUNTER — Other Ambulatory Visit: Payer: BC Managed Care – PPO

## 2020-07-27 DIAGNOSIS — K769 Liver disease, unspecified: Secondary | ICD-10-CM

## 2020-07-27 DIAGNOSIS — C786 Secondary malignant neoplasm of retroperitoneum and peritoneum: Secondary | ICD-10-CM | POA: Insufficient documentation

## 2020-07-27 DIAGNOSIS — C801 Malignant (primary) neoplasm, unspecified: Secondary | ICD-10-CM | POA: Insufficient documentation

## 2020-07-27 DIAGNOSIS — C787 Secondary malignant neoplasm of liver and intrahepatic bile duct: Secondary | ICD-10-CM | POA: Diagnosis not present

## 2020-07-27 LAB — CBC
HCT: 34.9 % — ABNORMAL LOW (ref 36.0–46.0)
Hemoglobin: 11.1 g/dL — ABNORMAL LOW (ref 12.0–15.0)
MCH: 25.1 pg — ABNORMAL LOW (ref 26.0–34.0)
MCHC: 31.8 g/dL (ref 30.0–36.0)
MCV: 78.8 fL — ABNORMAL LOW (ref 80.0–100.0)
Platelets: 407 10*3/uL — ABNORMAL HIGH (ref 150–400)
RBC: 4.43 MIL/uL (ref 3.87–5.11)
RDW: 16.7 % — ABNORMAL HIGH (ref 11.5–15.5)
WBC: 8.7 10*3/uL (ref 4.0–10.5)
nRBC: 0 % (ref 0.0–0.2)

## 2020-07-27 LAB — GLUCOSE, CAPILLARY
Glucose-Capillary: 88 mg/dL (ref 70–99)
Glucose-Capillary: 74 mg/dL (ref 70–99)

## 2020-07-27 LAB — PROTIME-INR
INR: 1 (ref 0.8–1.2)
Prothrombin Time: 13.2 seconds (ref 11.4–15.2)

## 2020-07-27 MED ORDER — MIDAZOLAM HCL 2 MG/2ML IJ SOLN
INTRAMUSCULAR | Status: AC | PRN
  Administered 2020-07-27: 1 mg via INTRAVENOUS

## 2020-07-27 MED ORDER — FENTANYL CITRATE (PF) 100 MCG/2ML IJ SOLN
INTRAMUSCULAR | Status: AC | PRN
Start: 2020-07-27 — End: 2020-07-27
  Administered 2020-07-27: 50 ug via INTRAVENOUS

## 2020-07-27 MED ORDER — FENTANYL CITRATE (PF) 100 MCG/2ML IJ SOLN
INTRAMUSCULAR | Status: AC
  Filled 2020-07-27: qty 2

## 2020-07-27 MED ORDER — SODIUM CHLORIDE 0.9 % IV SOLN: INTRAVENOUS | Status: DC

## 2020-07-27 MED ORDER — MIDAZOLAM HCL 2 MG/2ML IJ SOLN
INTRAMUSCULAR | Status: AC
  Filled 2020-07-27: qty 2

## 2020-07-27 NOTE — Procedures (Signed)
Pre Procedure Dx: Peritoneal Carcionomatosis Post Procedural Dx: Same  Technically successful US guided biopsy of infiltrative mass within the right lobe of the liver.  EBL: None No immediate complications.   Ronny Bacon, MD Pager #: [phone]

## 2020-07-27 NOTE — Consult Note (Signed)
Chief Complaint: Peritoneal carcinomatosis.  Referring Physician(s): Finnegan,Timothy J  Patient Status: [facility] - Out-pt  History of Present Illness: Lauren Lindsey is a 59 y.o. female with past medical history significant for diabetes who was incidentally noted to have peritoneal carcinomatosis on routine cholecystectomy subsequently found to have a infiltrative lesion involving the subcapsular aspect of the anterior segment of the right lobe liver for which the patient now presents for ultrasound-guided liver lesion biopsy for the acquisition of additional diagnostic tissue.  The patient is again without complaint.  Specifically, no abdominal pain.  No unintentional weight loss.  No yellowing of the skin or eyes.  No chest pain, shortness of breath, fever or chills.    Past Medical History:  Diagnosis Date  . Abnormal Pap smear of cervix   . Anemia    h/o  . Diabetes mellitus without complication [facility])     Past Surgical History:  Procedure Laterality Date  . COLONOSCOPY WITH PROPOFOL N/A 06/15/2020   Procedure: COLONOSCOPY WITH PROPOFOL;  Surgeon: Lin Landsman, MD;  Location: [place];  Service: Endoscopy;  Laterality: N/A;  Diabetic - oral meds  . ENDOMETRIAL ABLATION    . HYSTEROSCOPY WITH D & C    . LEEP      Allergies: Patient has no known allergies.  Medications: Prior to Admission medications   Medication Sig Start Date End Date Taking? Authorizing Provider  metFORMIN (GLUCOPHAGE) 500 MG tablet Take 500 mg by mouth 2 (two) times daily with a meal.  06/21/19  Yes [provider]     Family History  Problem Relation Age of Onset  . Bleeding Disorder Mother   . Biliary Cirrhosis Mother   . Liver cancer Mother   . Hypertension Mother   . Diabetes Father   . Lung cancer Father   . Breast cancer Neg Hx     Social History   Socioeconomic History  . Marital status: Married    Spouse name: Not on file  . Number of children: Not on  file  . Years of education: Not on file  . Highest education level: Not on file  Occupational History  . Not on file  Tobacco Use  . Smoking status: Never Smoker  . Smokeless tobacco: Never Used  Vaping Use  . Vaping Use: Never used  Substance and Sexual Activity  . Alcohol use: No  . Drug use: No  . Sexual activity: Yes    Birth control/protection: Post-menopausal  Other Topics Concern  . Not on file  Social History Narrative  . Not on file   Social Determinants of Health   Financial Resource Strain:   . Difficulty of Paying Living Expenses: Not on file  Food Insecurity:   . Worried About Charity fundraiser in the Last Year: Not on file  . Ran Out of Food in the Last Year: Not on file  Transportation Needs:   . Lack of Transportation (Medical): Not on file  . Lack of Transportation (Non-Medical): Not on file  Physical Activity:   . Days of Exercise per Week: Not on file  . Minutes of Exercise per Session: Not on file  Stress:   . Feeling of Stress : Not on file  Social Connections:   . Frequency of Communication with Friends and Family: Not on file  . Frequency of Social Gatherings with Friends and Family: Not on file  . Attends Religious Services: Not on file  . Active Member of Clubs or  Organizations: Not on file  . Attends Archivist Meetings: Not on file  . Marital Status: Not on file    ECOG Status: 0 - Asymptomatic  Review of Systems: A 12 point ROS discussed and pertinent positives are indicated in the HPI above.  All other systems are negative.  Review of Systems  Vital Signs: BP 133/65   Pulse 68   Temp 98.6 F (37 C) (Oral)   Resp 17   Ht 5\' 5"  (1.651 m)   Wt 74.8 kg   LMP 06/26/2019   SpO2 99%   BMI 27.46 kg/m   Physical Exam  Imaging: US PELVIS TRANSVAGINAL NON-OB (TV ONLY)  Result Date: 07/18/2020 Patient Name: Lauren Lindsey DOB: [birth year] MRN: [id number] ULTRASOUND REPORT Location: [facility] Date of Service:  07/18/2020 Indications:R/O pelvic mass Findings: The uterus is anteverted and measures 6.4 x 4.8 x 3.9 cm. Echo texture is heterogenous without evidence of focal masses. The Endometrium measures 4.4 mm. There are two complex nabothian cysts in the cervix. They measure 13.8 x 11.6 x 10.9 mm and 9.2 x 7.8 x 9.5 mm. Both cysts have an echogenic focus within with blood flow. The echogenic foci measure 7.8 x 5.7 x 4.2 mm and 6.1 x 5.9 x 4.3 mm respectively. Right Ovary measures 2.8 x 1.8 x 1.5 cm. It is normal in appearance. Left Ovary measures 3.9 x 2.1 x 1.8 cm. It is normal in appearance. Survey of the adnexa demonstrates no adnexal masses. There is no free fluid in the cul de sac. Impression: 1. There are two complex cysts in the cervix with blood flow. 2. The uterus is heterogeneous but no obvious fibroids are seen. 3. The endometrium appears fairly thin. 4. Normal appearing ovaries. [name], RT The ultrasound images and findings were reviewed by me and I agree with the above report. [name], MD, [name], [facility] 07/18/2020 9:09 AM     MR LIVER W WO CONTRAST  Result Date: 07/21/2020 CLINICAL DATA:  59 year old female with history of indeterminate liver lesion noted on prior CT examination, which demonstrated hypermetabolism on prior PET-CT. Follow-up study. EXAM: MRI ABDOMEN WITHOUT AND WITH CONTRAST TECHNIQUE: Multiplanar multisequence MR imaging of the abdomen was performed both before and after the administration of intravenous contrast. CONTRAST:  7.53mL GADAVIST GADOBUTROL 1 MMOL/ML IV SOLN COMPARISON:  No prior abdominal MRI. CT the abdomen and pelvis 06/16/2020. PET-CT 07/13/2020. FINDINGS: Lower chest: In the lateral segment of the right middle lobe there is a 9 mm nodule (axial image 5 of series 15), corresponding to calcified granuloma demonstrated on prior CT the abdomen and pelvis 06/16/2020. Hepatobiliary: Diffuse loss of signal intensity throughout  the hepatic parenchyma on out of phase dual echo images, indicative of a background of hepatic steatosis. In the right lobe of the liver, predominantly occupying portions of segments 5 and 8, there are ill-defined areas that are very poorly demonstrated on precontrast T1 weighted images ranging from mildly hypo to mildly hyperintense, subtly T2 hyperintense, with internal areas of diffusion restriction and heterogeneous enhancement on post gadolinium imaging. The borders of this region are somewhat indistinct, with an appearance suggestive of multiple adjacent lesions which are now contiguous with one another. Because of the indistinctness of the lesions, accurate measurement is challenging but the bulkiest portions of this lesion or lesions is estimated to measure approximately 5.8 x 3.5 x 7.2 cm (axial image 58 of series 11 and coronal image 40 of series 17).  No intra or extrahepatic biliary ductal dilatation. Status post cholecystectomy. Pancreas: No pancreatic mass. No pancreatic ductal dilatation. No pancreatic or peripancreatic fluid collections or inflammatory changes. Spleen:  Unremarkable. Adrenals/Urinary Tract: Bilateral kidneys and adrenal glands are unremarkable in appearance. No hydroureteronephrosis in the visualized portions of the abdomen. Stomach/Bowel: Visualized portions are unremarkable. Vascular/Lymphatic: No aneurysm identified in the visualized abdominal vasculature. Several prominent lymph nodes are noted adjacent to the porta hepatis within the portacaval nodal stations, largest of which measures up to 1.1 cm in short axis. Notably, all of these lymph nodes demonstrate diffusion restriction, and correspond to areas of hypermetabolism on the recent PET-CT, concerning for metastatic lymph nodes. Other: No significant volume of ascites noted in the visualized portions of the peritoneal cavity. Musculoskeletal: No aggressive appearing osseous lesions are noted in the visualized portions of the  skeleton. IMPRESSION: 1. Poorly characterized aggressive appearing infiltrative neoplasm in the right lobe of the liver occupying predominantly segments 5 and 8, with associated portacaval lymphadenopathy, as detailed above. Findings are highly concerning for malignancy, and correlation with tissue sampling is recommended to establish a tissue diagnosis. 2. Hepatic steatosis. Electronically Signed   By: Vinnie Langton M.D.   On: 07/21/2020 08:06   NM PET Image Initial (PI) Skull Base To Thigh  Result Date: 07/14/2020 CLINICAL DATA:  Initial treatment strategy for peritoneal carcinomatosis. Incidental peritoneal implant discovered during cholecystectomy. EXAM: NUCLEAR MEDICINE PET SKULL BASE TO THIGH TECHNIQUE: 9.02 mCi F-18 FDG was injected intravenously. Full-ring PET imaging was performed from the skull base to thigh after the radiotracer. CT data was obtained and used for attenuation correction and anatomic localization. Fasting blood glucose: 66 mg/dl COMPARISON:  Abdominopelvic CT 06/16/2020. Abdominal ultrasound 05/23/2020. FINDINGS: Mediastinal blood pool activity: SUV max 2.4 NECK: No hypermetabolic cervical lymph nodes are identified.There are no lesions of the pharyngeal mucosal space. There is physiologic metabolic activity within the lymphoid tissue of Waldeyer's ring. Incidental CT findings: none CHEST: There are no hypermetabolic mediastinal, hilar or axillary lymph nodes. No hypermetabolic pulmonary activity or suspicious pulmonary nodularity. There is a calcified right middle lobe granuloma. Incidental CT findings: Dilated azygos vein. ABDOMEN/PELVIS: There is prominent hypermetabolic activity within the cholecystectomy bed with inferolateral extension in the right hepatic lobe towards the abnormality seen on previous CT. The hepatic lesion is not well seen on this noncontrast study, although measures approximately 2.5 cm on image 156/3. This demonstrates an SUV max of 8.3, suspicious for  malignancy. There is also focal hypermetabolic activity in porta hepatis (SUV max 6.2) which may relate to an ampullary lesion or lymph node. There is an additional (5 mm) lymph node in the gastrohepatic ligament which is mildly hypermetabolic for size (SUV max 4.0). No other hypermetabolic abdominopelvic lymph nodes are identified. There is no abnormal metabolic activity within the spleen, pancreas or adrenal glands. There is no ascites or generalized peritoneal activity. There is prominent bowel activity without focal abnormality, within physiologic limits. Incidental CT findings: Underlying hepatic steatosis. As correlated with previous abdominal CT, there is intrahepatic discontinuation of the intrahepatic IVC with azygos vein continuation (normal variant). SKELETON: There is a small focus of hypermetabolic activity projecting over the central left hip in the region of the femoral head fovea (SUV max 5.4). This is likely incidental. No other osseous activity suspicious for metastatic disease. Incidental CT findings: Mild spondylosis. IMPRESSION: 1. Hypermetabolic lesion in the anterior inferior right hepatic lobe (segment 5), corresponding with the abnormality on previous CT, suspicious for malignancy. Less intense activity  in the cholecystectomy bed is nonspecific. 2. Additional hypermetabolic activity in the porta hepatis, not well localized. This may correspond with adenopathy or an ampullary lesion. 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 Consider further evaluation with abdominal MRI without and with contrast. 3. No evidence of peritoneal carcinomatosis or distant metastases. Electronically Signed   By: Richardean Sale M.D.   On: 07/14/2020 10:14   US BREAST LTD UNI LEFT INC AXILLA  Result Date: 07/19/2020 CLINICAL DATA:  Patient with incidentally diagnosed peritoneal carcinomatosis of uncertain primary. Provider  appreciates LEFT axillary adenopathy. EXAM: DIGITAL DIAGNOSTIC UNILATERAL LEFT MAMMOGRAM WITH TOMO AND CAD; ULTRASOUND LEFT BREAST LIMITED COMPARISON:  Previous exam(s). ACR Breast Density Category b: There are scattered areas of fibroglandular density. FINDINGS: Tomosynthesis views were obtained over the palpable area of concern in the the LEFT axillary breast. No suspicious mammographic finding is identified in this area. No suspicious mass, microcalcification, or other finding is identified in the LEFT breast. Mammographic images were processed with CAD. On physical exam, no suspicious mass is appreciated. Targeted LEFT breast ultrasound was performed in the palpable area of concern at the LEFT axilla. No suspicious solid or cystic mass is identified. Benign appearing lymph nodes with thin smooth cortices are seen. IMPRESSION: No mammographic or sonographic evidence of malignancy in the LEFT axilla. Benign lymph nodes are seen. RECOMMENDATION: Recommend return to screening mammography as clinically indicated. This is due in January of 2022. I have discussed the findings and recommendations with the patient. If applicable, a reminder letter will be sent to the patient regarding the next appointment. BI-RADS CATEGORY  2: Benign. Electronically Signed   By: Valentino Saxon MD   On: 07/19/2020 14:04   MM DIAG BREAST TOMO UNI LEFT  Result Date: 07/19/2020 CLINICAL DATA:  Patient with incidentally diagnosed peritoneal carcinomatosis of uncertain primary. Provider appreciates LEFT axillary adenopathy. EXAM: DIGITAL DIAGNOSTIC UNILATERAL LEFT MAMMOGRAM WITH TOMO AND CAD; ULTRASOUND LEFT BREAST LIMITED COMPARISON:  Previous exam(s). ACR Breast Density Category b: There are scattered areas of fibroglandular density. FINDINGS: Tomosynthesis views were obtained over the palpable area of concern in the the LEFT axillary breast. No suspicious mammographic finding is identified in this area. No suspicious mass,  microcalcification, or other finding is identified in the LEFT breast. Mammographic images were processed with CAD. On physical exam, no suspicious mass is appreciated. Targeted LEFT breast ultrasound was performed in the palpable area of concern at the LEFT axilla. No suspicious solid or cystic mass is identified. Benign appearing lymph nodes with thin smooth cortices are seen. IMPRESSION: No mammographic or sonographic evidence of malignancy in the LEFT axilla. Benign lymph nodes are seen. RECOMMENDATION: Recommend return to screening mammography as clinically indicated. This is due in January of 2022. I have discussed the findings and recommendations with the patient. If applicable, a reminder letter will be sent to the patient regarding the next appointment. BI-RADS CATEGORY  2: Benign. Electronically Signed   By: Valentino Saxon MD   On: 07/19/2020 14:04    Labs:  CBC: Recent Labs    05/23/20 1301 07/27/20 0944  WBC 10.9* 8.7  HGB 11.3* 11.1*  HCT 35.6* 34.9*  PLT 401* 407*    COAGS: Recent Labs    07/27/20 0944  INR 1.0    BMP: Recent Labs    05/23/20 1301  NA 139  K 3.6  CL 106  CO2 26  GLUCOSE 91  BUN 24*  CALCIUM 8.6*  CREATININE 0.74  GFRNONAA >60  GFRAA >60    LIVER FUNCTION TESTS: Recent Labs    05/23/20 1301  BILITOT 1.0  AST 50*  ALT 74*  ALKPHOS 52  PROT 7.9  ALBUMIN 4.2    TUMOR MARKERS: No results for input(s): AFPTM, CEA, CA199, CHROMGRNA in the last 8760 hours.  Assessment and Plan:  Lauren Lindsey is a 59 y.o. female with past medical history significant for diabetes who was incidentally noted to have peritoneal carcinomatosis on routine cholecystectomy subsequently found to have a infiltrative lesion involving the subcapsular aspect of the anterior segment of the right lobe liver for which the patient now presents for ultrasound-guided liver lesion biopsy for the acquisition of additional diagnostic tissue.  The patient is without  complaint.    Risks and benefits of US guided liver lesion Bx was discussed with the patient and/or patient's family including, but not limited to bleeding, infection, damage to adjacent structures or low yield requiring additional tests.  All of the questions were answered and there is agreement to proceed.  Consent signed and in chart.  Thank you for this interesting consult.  I greatly enjoyed meeting Lauren Lindsey and look forward to participating in their care.  A copy of this report was sent to the requesting provider on this date.  Electronically Signed: Sandi Mariscal, MD 07/27/2020, 10:35 AM   I spent a total of 15 Minutes in face to face in clinical consultation, greater than 50% of which was counseling/coordinating care for US guided liver lesion biopsy.

## 2020-07-27 NOTE — Discharge Instructions (Signed)

## 2020-07-27 NOTE — Progress Notes (Signed)
Patient clinically stable post Liver biopsy per Dr Pascal Lux, tolerated well. Awake/alert and oriented post procedure. Denies complaints at this time.received Versed 1 mg along with Fentanyl 50 mcg IV for procedure. NPO until 1230, after which po's per orders.

## 2020-07-28 NOTE — Progress Notes (Signed)
Tumor Board Documentation  Lauren Lindsey was presented by Dr [name] at our Tumor Board on 07/27/2020, which included representatives from medical oncology, radiation oncology, surgical, radiology, pathology, navigation, internal medicine, palliative care, research, pharmacy, pulmonology.  Lauren Lindsey currently presents as a new patient, for [place], for new positive pathology with history of the following treatments: active survellience, surgical intervention(s).  Additionally, we reviewed previous medical and familial history, history of present illness, and recent lab results along with all available histopathologic and imaging studies. The tumor board considered available treatment options and made the following recommendations:   Awaiting furter testing  The following procedures/referrals were also placed: No orders of the defined types were placed in this encounter.   Clinical Trial Status: not discussed   Staging used:    AJCC Staging: T: X N: X M: 1 Group: Stage IVEpithelioid Neoplasm   National site-specific guidelines   were discussed with respect to the case.  Tumor board is a meeting of clinicians from various specialty areas who evaluate and discuss patients for whom a multidisciplinary approach is being considered. Final determinations in the plan of care are those of the provider(s). The responsibility for follow up of recommendations given during tumor board is that of the provider.   Today's extended care, comprehensive team conference, Lauren Lindsey was not present for the discussion and was not examined.   Multidisciplinary Tumor Board is a multidisciplinary case peer review process.  Decisions discussed in the Multidisciplinary Tumor Board reflect the opinions of the specialists present at the conference without having examined the patient.  Ultimately, treatment and diagnostic decisions rest with the primary provider(s) and the patient.

## 2020-07-30 NOTE — Progress Notes (Signed)
[place]  Telephone:([phone]) [phone] Fax:(336) [phone]  ID: Lauren Lindsey OB: Mar 15, 1961  MR#: [id number]  SNK#:[id number]  Patient Care Team: [name], MD as PCP - General (Family Medicine) Patient, No Pcp Per (General Practice) [name], RN as Oncology Nurse Navigator  CHIEF COMPLAINT: Stage IV intrahepatic cholangiocarcinoma.  INTERVAL HISTORY: Patient returns to clinic today to discuss her diagnosis and treatment planning.  She continues to feel well and remains asymptomatic. She has no neurologic complaints.  She denies any recent fevers or illnesses.  She has a good appetite and denies weight loss.  She has no chest pain, shortness of breath, cough, or hemoptysis. Her abdominal pain resolved with her cholecystectomy.  She denies any nausea, vomiting, constipation, or diarrhea.  She has no melena or hematochezia.  She has no urinary complaints.  Patient offers no specific complaints today.  REVIEW OF SYSTEMS:   Review of Systems  Constitutional: Negative.  Negative for fever, malaise/fatigue and weight loss.  Respiratory: Negative.  Negative for cough, hemoptysis and shortness of breath.   Cardiovascular: Negative.  Negative for chest pain and leg swelling.  Gastrointestinal: Negative.  Negative for abdominal pain, blood in stool, constipation, diarrhea, melena, nausea and vomiting.  Genitourinary: Negative.  Negative for dysuria.  Musculoskeletal: Negative.   Skin: Negative.  Negative for rash.  Neurological: Negative.  Negative for dizziness, focal weakness, weakness and headaches.  Psychiatric/Behavioral: Negative.  The patient is not nervous/anxious.     As per HPI. Otherwise, a complete review of systems is negative.  PAST MEDICAL HISTORY: Past Medical History:  Diagnosis Date  . Abnormal Pap smear of cervix   . Anemia    h/o  . Diabetes mellitus without complication ([place])     PAST SURGICAL HISTORY: Past Surgical History:   Procedure Laterality Date  . COLONOSCOPY WITH PROPOFOL N/A 06/15/2020   Procedure: COLONOSCOPY WITH PROPOFOL;  Surgeon: Lin Landsman, MD;  Location: [place];  Service: Endoscopy;  Laterality: N/A;  Diabetic - oral meds  . ENDOMETRIAL ABLATION    . HYSTEROSCOPY WITH D & C    . LEEP      FAMILY HISTORY: Family History  Problem Relation Age of Onset  . Bleeding Disorder Mother   . Biliary Cirrhosis Mother   . Liver cancer Mother   . Hypertension Mother   . Diabetes Father   . Lung cancer Father   . Breast cancer Neg Hx     ADVANCED DIRECTIVES (Y/N):  N  HEALTH MAINTENANCE: Social History   Tobacco Use  . Smoking status: Never Smoker  . Smokeless tobacco: Never Used  Vaping Use  . Vaping Use: Never used  Substance Use Topics  . Alcohol use: No  . Drug use: No     Colonoscopy:  PAP:  Bone density:  Lipid panel:  No Known Allergies  Current Outpatient Medications  Medication Sig Dispense Refill  . metFORMIN (GLUCOPHAGE) 500 MG tablet Take 500 mg by mouth 2 (two) times daily with a meal.      No current facility-administered medications for this visit.    OBJECTIVE: Vitals:   08/03/20 0940  BP: 128/67  Pulse: 83  Resp: 18  Temp: 100 F (37.8 C)     Body mass index is 27.46 kg/m.    ECOG FS:0 - Asymptomatic  General: Well-developed, well-nourished, no acute distress. Eyes: Pink conjunctiva, anicteric sclera. HEENT: Normocephalic, moist mucous membranes. Lungs: No audible wheezing or coughing. Heart: Regular rate and rhythm.  Abdomen: Soft, nontender, no obvious distention. Musculoskeletal: No edema, cyanosis, or clubbing. Neuro: Alert, answering all questions appropriately. Cranial nerves grossly intact. Skin: No rashes or petechiae noted. Psych: Normal affect.   LAB RESULTS:  Lab Results  Component Value Date   NA 139 05/23/2020   K 3.6 05/23/2020   CL 106 05/23/2020   CO2 26 05/23/2020   GLUCOSE 91 05/23/2020   BUN 24 (H)  05/23/2020   CREATININE 0.74 05/23/2020   CALCIUM 8.6 (L) 05/23/2020   PROT 7.9 05/23/2020   ALBUMIN 4.2 05/23/2020   AST 50 (H) 05/23/2020   ALT 74 (H) 05/23/2020   ALKPHOS 52 05/23/2020   BILITOT 1.0 05/23/2020   GFRNONAA >60 05/23/2020   GFRAA >60 05/23/2020    Lab Results  Component Value Date   WBC 8.7 07/27/2020   HGB 11.1 (L) 07/27/2020   HCT 34.9 (L) 07/27/2020   MCV 78.8 (L) 07/27/2020   PLT 407 (H) 07/27/2020     STUDIES: US PELVIS TRANSVAGINAL NON-OB (TV ONLY)  Result Date: 07/18/2020 Patient Name: Lauren Lindsey DOB: [birth year] MRN: [id number] ULTRASOUND REPORT Location: [facility] Date of Service: 07/18/2020 Indications:R/O pelvic mass Findings: The uterus is anteverted and measures 6.4 x 4.8 x 3.9 cm. Echo texture is heterogenous without evidence of focal masses. The Endometrium measures 4.4 mm. There are two complex nabothian cysts in the cervix. They measure 13.8 x 11.6 x 10.9 mm and 9.2 x 7.8 x 9.5 mm. Both cysts have an echogenic focus within with blood flow. The echogenic foci measure 7.8 x 5.7 x 4.2 mm and 6.1 x 5.9 x 4.3 mm respectively. Right Ovary measures 2.8 x 1.8 x 1.5 cm. It is normal in appearance. Left Ovary measures 3.9 x 2.1 x 1.8 cm. It is normal in appearance. Survey of the adnexa demonstrates no adnexal masses. There is no free fluid in the cul de sac. Impression: 1. There are two complex cysts in the cervix with blood flow. 2. The uterus is heterogeneous but no obvious fibroids are seen. 3. The endometrium appears fairly thin. 4. Normal appearing ovaries. [name], RT The ultrasound images and findings were reviewed by me and I agree with the above report. [name], MD, [name], [facility] 07/18/2020 9:09 AM     MR LIVER W WO CONTRAST  Result Date: 07/21/2020 CLINICAL DATA:  59 year old female with history of indeterminate liver lesion noted on prior CT examination, which demonstrated hypermetabolism on  prior PET-CT. Follow-up study. EXAM: MRI ABDOMEN WITHOUT AND WITH CONTRAST TECHNIQUE: Multiplanar multisequence MR imaging of the abdomen was performed both before and after the administration of intravenous contrast. CONTRAST:  7.10mL GADAVIST GADOBUTROL 1 MMOL/ML IV SOLN COMPARISON:  No prior abdominal MRI. CT the abdomen and pelvis 06/16/2020. PET-CT 07/13/2020. FINDINGS: Lower chest: In the lateral segment of the right middle lobe there is a 9 mm nodule (axial image 5 of series 15), corresponding to calcified granuloma demonstrated on prior CT the abdomen and pelvis 06/16/2020. Hepatobiliary: Diffuse loss of signal intensity throughout the hepatic parenchyma on out of phase dual echo images, indicative of a background of hepatic steatosis. In the right lobe of the liver, predominantly occupying portions of segments 5 and 8, there are ill-defined areas that are very poorly demonstrated on precontrast T1 weighted images ranging from mildly hypo to mildly hyperintense, subtly T2 hyperintense, with internal areas of diffusion restriction and heterogeneous enhancement on post gadolinium imaging. The borders of this region are somewhat  indistinct, with an appearance suggestive of multiple adjacent lesions which are now contiguous with one another. Because of the indistinctness of the lesions, accurate measurement is challenging but the bulkiest portions of this lesion or lesions is estimated to measure approximately 5.8 x 3.5 x 7.2 cm (axial image 58 of series 11 and coronal image 40 of series 17). No intra or extrahepatic biliary ductal dilatation. Status post cholecystectomy. Pancreas: No pancreatic mass. No pancreatic ductal dilatation. No pancreatic or peripancreatic fluid collections or inflammatory changes. Spleen:  Unremarkable. Adrenals/Urinary Tract: Bilateral kidneys and adrenal glands are unremarkable in appearance. No hydroureteronephrosis in the visualized portions of the abdomen. Stomach/Bowel: Visualized  portions are unremarkable. Vascular/Lymphatic: No aneurysm identified in the visualized abdominal vasculature. Several prominent lymph nodes are noted adjacent to the porta hepatis within the portacaval nodal stations, largest of which measures up to 1.1 cm in short axis. Notably, all of these lymph nodes demonstrate diffusion restriction, and correspond to areas of hypermetabolism on the recent PET-CT, concerning for metastatic lymph nodes. Other: No significant volume of ascites noted in the visualized portions of the peritoneal cavity. Musculoskeletal: No aggressive appearing osseous lesions are noted in the visualized portions of the skeleton. IMPRESSION: 1. Poorly characterized aggressive appearing infiltrative neoplasm in the right lobe of the liver occupying predominantly segments 5 and 8, with associated portacaval lymphadenopathy, as detailed above. Findings are highly concerning for malignancy, and correlation with tissue sampling is recommended to establish a tissue diagnosis. 2. Hepatic steatosis. Electronically Signed   By: Vinnie Langton M.D.   On: 07/21/2020 08:06   NM PET Image Initial (PI) Skull Base To Thigh  Result Date: 07/14/2020 CLINICAL DATA:  Initial treatment strategy for peritoneal carcinomatosis. Incidental peritoneal implant discovered during cholecystectomy. EXAM: NUCLEAR MEDICINE PET SKULL BASE TO THIGH TECHNIQUE: 9.02 mCi F-18 FDG was injected intravenously. Full-ring PET imaging was performed from the skull base to thigh after the radiotracer. CT data was obtained and used for attenuation correction and anatomic localization. Fasting blood glucose: 66 mg/dl COMPARISON:  Abdominopelvic CT 06/16/2020. Abdominal ultrasound 05/23/2020. FINDINGS: Mediastinal blood pool activity: SUV max 2.4 NECK: No hypermetabolic cervical lymph nodes are identified.There are no lesions of the pharyngeal mucosal space. There is physiologic metabolic activity within the lymphoid tissue of Waldeyer's  ring. Incidental CT findings: none CHEST: There are no hypermetabolic mediastinal, hilar or axillary lymph nodes. No hypermetabolic pulmonary activity or suspicious pulmonary nodularity. There is a calcified right middle lobe granuloma. Incidental CT findings: Dilated azygos vein. ABDOMEN/PELVIS: There is prominent hypermetabolic activity within the cholecystectomy bed with inferolateral extension in the right hepatic lobe towards the abnormality seen on previous CT. The hepatic lesion is not well seen on this noncontrast study, although measures approximately 2.5 cm on image 156/3. This demonstrates an SUV max of 8.3, suspicious for malignancy. There is also focal hypermetabolic activity in porta hepatis (SUV max 6.2) which may relate to an ampullary lesion or lymph node. There is an additional (5 mm) lymph node in the gastrohepatic ligament which is mildly hypermetabolic for size (SUV max 4.0). No other hypermetabolic abdominopelvic lymph nodes are identified. There is no abnormal metabolic activity within the spleen, pancreas or adrenal glands. There is no ascites or generalized peritoneal activity. There is prominent bowel activity without focal abnormality, within physiologic limits. Incidental CT findings: Underlying hepatic steatosis. As correlated with previous abdominal CT, there is intrahepatic discontinuation of the intrahepatic IVC with azygos vein continuation (normal variant). SKELETON: There is a small focus of hypermetabolic activity  projecting over the central left hip in the region of the femoral head fovea (SUV max 5.4). This is likely incidental. No other osseous activity suspicious for metastatic disease. Incidental CT findings: Mild spondylosis. IMPRESSION: 1. Hypermetabolic lesion in the anterior inferior right hepatic lobe (segment 5), corresponding with the abnormality on previous CT, suspicious for malignancy. Less intense activity in the cholecystectomy bed is nonspecific. 2. Additional  hypermetabolic activity in the porta hepatis, not well localized. This may correspond with adenopathy or an ampullary lesion. 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 Consider further evaluation with abdominal MRI without and with contrast. 3. No evidence of peritoneal carcinomatosis or distant metastases. Electronically Signed   By: Richardean Sale M.D.   On: 07/14/2020 10:14   US BIOPSY (LIVER)  Result Date: 07/27/2020 INDICATION: Peritoneal carcinomatosis incidentally noted on routine cholecystectomy. Patient now presents for ultrasound-guided liver lesion biopsy for the acquisition of additional tissue for diagnostic purposes. EXAM: ULTRASOUND GUIDED LIVER LESION BIOPSY COMPARISON:  CT abdomen pelvis-06/16/2020; PET-CT-07/14/2020;-07/21/2020 MEDICATIONS: None ANESTHESIA/SEDATION: Fentanyl 50 mcg IV; Versed 1 mg IV Total Moderate Sedation time:  10 Minutes. The patient's level of consciousness and vital signs were monitored continuously by radiology nursing throughout the procedure under my direct supervision. COMPLICATIONS: None immediate. PROCEDURE: Informed written consent was obtained from the patient after a discussion of the risks, benefits and alternatives to treatment. The patient understands and consents the procedure. A timeout was performed prior to the initiation of the procedure. Ultrasound scanning was performed of the right upper abdominal quadrant demonstrates an infiltrative hypoechoic mass involving the subcapsular aspect of the anterior segment of the right lobe of the liver measuring at least 4.6 x 2.8 x 3.5 cm (images 2 through 9) as well as an adjacent satellite lesion measuring approximately 1.9 x 1.2 cm (image 10). Dominant hypoechoic lesion was targeted for biopsy and the procedure was planned. The right upper abdominal quadrant was prepped and draped in the usual sterile fashion. The overlying  soft tissues were anesthetized with 1% lidocaine with epinephrine. A 17 gauge, 6.8 cm co-axial needle was advanced into a peripheral aspect of the lesion. This was followed by 5 core biopsies with an 18 gauge core device under direct ultrasound guidance. The coaxial needle tract was embolized with a small amount of Gel-Foam slurry and superficial hemostasis was obtained with manual compression. Post procedural scanning was negative for definitive area of hemorrhage or additional complication. A dressing was placed. The patient tolerated the procedure well without immediate post procedural complication. IMPRESSION: Technically successful ultrasound guided core needle biopsy of infiltrative mass involving the subcapsular aspect of the anterior segment of the right lobe of the liver. Electronically Signed   By: Sandi Mariscal M.D.   On: 07/27/2020 12:20   US BREAST LTD UNI LEFT INC AXILLA  Result Date: 07/19/2020 CLINICAL DATA:  Patient with incidentally diagnosed peritoneal carcinomatosis of uncertain primary. Provider appreciates LEFT axillary adenopathy. EXAM: DIGITAL DIAGNOSTIC UNILATERAL LEFT MAMMOGRAM WITH TOMO AND CAD; ULTRASOUND LEFT BREAST LIMITED COMPARISON:  Previous exam(s). ACR Breast Density Category b: There are scattered areas of fibroglandular density. FINDINGS: Tomosynthesis views were obtained over the palpable area of concern in the the LEFT axillary breast. No suspicious mammographic finding is identified in this area. No suspicious mass, microcalcification, or other finding is identified in the LEFT breast. Mammographic images were processed with CAD. On physical exam, no suspicious mass is appreciated. Targeted LEFT breast ultrasound was performed in the  palpable area of concern at the LEFT axilla. No suspicious solid or cystic mass is identified. Benign appearing lymph nodes with thin smooth cortices are seen. IMPRESSION: No mammographic or sonographic evidence of malignancy in the LEFT  axilla. Benign lymph nodes are seen. RECOMMENDATION: Recommend return to screening mammography as clinically indicated. This is due in January of 2022. I have discussed the findings and recommendations with the patient. If applicable, a reminder letter will be sent to the patient regarding the next appointment. BI-RADS CATEGORY  2: Benign. Electronically Signed   By: Valentino Saxon MD   On: 07/19/2020 14:04   MM DIAG BREAST TOMO UNI LEFT  Result Date: 07/19/2020 CLINICAL DATA:  Patient with incidentally diagnosed peritoneal carcinomatosis of uncertain primary. Provider appreciates LEFT axillary adenopathy. EXAM: DIGITAL DIAGNOSTIC UNILATERAL LEFT MAMMOGRAM WITH TOMO AND CAD; ULTRASOUND LEFT BREAST LIMITED COMPARISON:  Previous exam(s). ACR Breast Density Category b: There are scattered areas of fibroglandular density. FINDINGS: Tomosynthesis views were obtained over the palpable area of concern in the the LEFT axillary breast. No suspicious mammographic finding is identified in this area. No suspicious mass, microcalcification, or other finding is identified in the LEFT breast. Mammographic images were processed with CAD. On physical exam, no suspicious mass is appreciated. Targeted LEFT breast ultrasound was performed in the palpable area of concern at the LEFT axilla. No suspicious solid or cystic mass is identified. Benign appearing lymph nodes with thin smooth cortices are seen. IMPRESSION: No mammographic or sonographic evidence of malignancy in the LEFT axilla. Benign lymph nodes are seen. RECOMMENDATION: Recommend return to screening mammography as clinically indicated. This is due in January of 2022. I have discussed the findings and recommendations with the patient. If applicable, a reminder letter will be sent to the patient regarding the next appointment. BI-RADS CATEGORY  2: Benign. Electronically Signed   By: Valentino Saxon MD   On: 07/19/2020 14:04    ASSESSMENT: Stage IV intrahepatic  cholangiocarcinoma.  PLAN:    1.  Stage IV intrahepatic cholangiocarcinoma: Incidental finding on routine cholecystectomy.  PET scan results from July 13, 2020 as well as MRI results from July 20, 2020 reviewed independently and report as above with right lobe liver lesion consistent with intrahepatic cholangiocarcinoma.  Biopsy confirmed the results.  Gynecologic work-up is essentially negative.  Colonoscopy on June 15, 2020 was essentially within normal limits.  Mammogram on July 19, 2020 was reported as BI-RADS 2. All patient tumor markers are negative.  Patient has declined port placement.  Plan to give cisplatin and gemcitabine on day one with gemcitabine only on day eight.  This will be a 21-day cycle and will reimage after four cycles.  Return to clinic on August 23, 2020 to initiate cycle one, day one.  I spent a total of 30 minutes reviewing chart data, face-to-face evaluation with the patient, counseling and coordination of care as detailed above.   Patient expressed understanding and was in agreement with this plan. She also understands that She can call clinic at any time with any questions, concerns, or complaints.   Cancer Staging Intrahepatic cholangiocarcinoma ([place]) Staging form: Intrahepatic Bile Duct, AJCC 8th Edition - Clinical stage from 08/04/2020: Stage IV (cT1b, cN0, pM1) - Signed by Lloyd Huger, MD on 08/04/2020   Lloyd Huger, MD   08/04/2020 6:57 AM

## 2020-08-01 ENCOUNTER — Encounter: Payer: Self-pay | Admitting: *Deleted

## 2020-08-02 ENCOUNTER — Other Ambulatory Visit: Payer: Self-pay | Admitting: Anatomic Pathology & Clinical Pathology

## 2020-08-02 ENCOUNTER — Encounter: Payer: Self-pay | Admitting: Oncology

## 2020-08-02 LAB — SURGICAL PATHOLOGY

## 2020-08-03 ENCOUNTER — Inpatient Hospital Stay (HOSPITAL_BASED_OUTPATIENT_CLINIC_OR_DEPARTMENT_OTHER): Payer: BC Managed Care – PPO | Admitting: Oncology

## 2020-08-03 ENCOUNTER — Encounter: Payer: Self-pay | Admitting: *Deleted

## 2020-08-03 ENCOUNTER — Other Ambulatory Visit: Payer: Self-pay

## 2020-08-03 VITALS — BP 128/67 | HR 83 | Temp 100.0°F | Resp 18 | Wt 165.0 lb

## 2020-08-03 DIAGNOSIS — C786 Secondary malignant neoplasm of retroperitoneum and peritoneum: Secondary | ICD-10-CM

## 2020-08-03 DIAGNOSIS — Z7189 Other specified counseling: Secondary | ICD-10-CM

## 2020-08-03 DIAGNOSIS — C221 Intrahepatic bile duct carcinoma: Secondary | ICD-10-CM | POA: Diagnosis not present

## 2020-08-03 DIAGNOSIS — C482 Malignant neoplasm of peritoneum, unspecified: Secondary | ICD-10-CM | POA: Diagnosis not present

## 2020-08-03 NOTE — Progress Notes (Signed)
Pt in for follow up, 1 week after biopsy. Pt states CMA called and reviewed meds and chart yesterday for today's visit.

## 2020-08-04 DIAGNOSIS — Z7189 Other specified counseling: Secondary | ICD-10-CM | POA: Insufficient documentation

## 2020-08-04 NOTE — Progress Notes (Signed)
START OFF PATHWAY REGIMEN - Other   [id number]:Cisplatin 80 mg/m2 IV D1 + Gemcitabine 1,000 mg/m2 IV D1,8 q21 Days:   A cycle is every 21 days:     Gemcitabine      Cisplatin   **Always confirm dose/schedule in your pharmacy ordering system**  Patient Characteristics: Intent of Therapy: Non-Curative / Palliative Intent, Discussed with Patient

## 2020-08-09 NOTE — Patient Instructions (Signed)
 Cisplatin injection What is this medicine? CISPLATIN (SIS pla tin) is a chemotherapy drug. It targets fast dividing cells, like cancer cells, and causes these cells to die. This medicine is used to treat many types of cancer like bladder, ovarian, and testicular cancers. This medicine may be used for other purposes; ask your health care provider or pharmacist if you have questions. COMMON BRAND NAME(S): Platinol, Platinol -AQ What should I tell my health care provider before I take this medicine? They need to know if you have any of these conditions:  eye disease, vision problems  hearing problems  kidney disease  low blood counts, like white cells, platelets, or red blood cells  tingling of the fingers or toes, or other nerve disorder  an unusual or allergic reaction to cisplatin, carboplatin, oxaliplatin, other medicines, foods, dyes, or preservatives  pregnant or trying to get pregnant  breast-feeding How should I use this medicine? This drug is given as an infusion into a vein. It is administered in a hospital or clinic by a specially trained health care professional. Talk to your pediatrician regarding the use of this medicine in children. Special care may be needed. Overdosage: If you think you have taken too much of this medicine contact a poison control center or emergency room at once. NOTE: This medicine is only for you. Do not share this medicine with others. What if I miss a dose? It is important not to miss a dose. Call your doctor or health care professional if you are unable to keep an appointment. What may interact with this medicine? This medicine may interact with the following medications:  foscarnet  certain antibiotics like amikacin, gentamicin, neomycin, polymyxin B, streptomycin, tobramycin, vancomycin This list may not describe all possible interactions. Give your health care provider a list of all the medicines, herbs, non-prescription drugs, or  dietary supplements you use. Also tell them if you smoke, drink alcohol, or use illegal drugs. Some items may interact with your medicine. What should I watch for while using this medicine? Your condition will be monitored carefully while you are receiving this medicine. You will need important blood work done while you are taking this medicine. This drug may make you feel generally unwell. This is not uncommon, as chemotherapy can affect healthy cells as well as cancer cells. Report any side effects. Continue your course of treatment even though you feel ill unless your doctor tells you to stop. This medicine may increase your risk of getting an infection. Call your healthcare professional for advice if you get a fever, chills, or sore throat, or other symptoms of a cold or flu. Do not treat yourself. Try to avoid being around people who are sick. Avoid taking medicines that contain aspirin, acetaminophen, ibuprofen, naproxen, or ketoprofen unless instructed by your healthcare professional. These medicines may hide a fever. This medicine may increase your risk to bruise or bleed. Call your doctor or health care professional if you notice any unusual bleeding. Be careful brushing and flossing your teeth or using a toothpick because you may get an infection or bleed more easily. If you have any dental work done, tell your dentist you are receiving this medicine. Do not become pregnant while taking this medicine or for 14 months after stopping it. Women should inform their healthcare professional if they wish to become pregnant or think they might be pregnant. Men should not father a child while taking this medicine and for 11 months after stopping it. There is potential   for serious side effects to an unborn child. Talk to your healthcare professional for more information. Do not breast-feed an infant while taking this medicine. This medicine has caused ovarian failure in some women. This medicine may make  it more difficult to get pregnant. Talk to your healthcare professional if you are concerned about your fertility. This medicine has caused decreased sperm counts in some men. This may make it more difficult to father a child. Talk to your healthcare professional if you are concerned about your fertility. Drink fluids as directed while you are taking this medicine. This will help protect your kidneys. Call your doctor or health care professional if you get diarrhea. Do not treat yourself. What side effects may I notice from receiving this medicine? Side effects that you should report to your doctor or health care professional as soon as possible:  allergic reactions like skin rash, itching or hives, swelling of the face, lips, or tongue  blurred vision  changes in vision  decreased hearing or ringing of the ears  nausea, vomiting  pain, redness, or irritation at site where injected  pain, tingling, numbness in the hands or feet  signs and symptoms of bleeding such as bloody or black, tarry stools; red or dark brown urine; spitting up blood or brown material that looks like coffee grounds; red spots on the skin; unusual bruising or bleeding from the eyes, gums, or nose  signs and symptoms of infection like fever; chills; cough; sore throat; pain or trouble passing urine  signs and symptoms of kidney injury like trouble passing urine or change in the amount of urine  signs and symptoms of low red blood cells or anemia such as unusually weak or tired; feeling faint or lightheaded; falls; breathing problems Side effects that usually do not require medical attention (report to your doctor or health care professional if they continue or are bothersome):  loss of appetite  mouth sores  muscle cramps This list may not describe all possible side effects. Call your doctor for medical advice about side effects. You may report side effects to FDA at 1-800-FDA-1088. Where should I keep my  medicine? This drug is given in a hospital or clinic and will not be stored at home. NOTE: This sheet is a summary. It may not cover all possible information. If you have questions about this medicine, talk to your doctor, pharmacist, or health care provider.  2020 Elsevier/Gold Standard (2018-09-04 15:59:17)   Gemcitabine injection What is this medicine? GEMCITABINE (jem SYE ta been) is a chemotherapy drug. This medicine is used to treat many types of cancer like breast cancer, lung cancer, pancreatic cancer, and ovarian cancer. This medicine may be used for other purposes; ask your health care provider or pharmacist if you have questions. COMMON BRAND NAME(S): Gemzar, Infugem What should I tell my health care provider before I take this medicine? They need to know if you have any of these conditions:  blood disorders  infection  kidney disease  liver disease  lung or breathing disease, like asthma  recent or ongoing radiation therapy  an unusual or allergic reaction to gemcitabine, other chemotherapy, other medicines, foods, dyes, or preservatives  pregnant or trying to get pregnant  breast-feeding How should I use this medicine? This drug is given as an infusion into a vein. It is administered in a hospital or clinic by a specially trained health care professional. Talk to your pediatrician regarding the use of this medicine in children. Special care may   be needed. Overdosage: If you think you have taken too much of this medicine contact a poison control center or emergency room at once. NOTE: This medicine is only for you. Do not share this medicine with others. What if I miss a dose? It is important not to miss your dose. Call your doctor or health care professional if you are unable to keep an appointment. What may interact with this medicine?  medicines to increase blood counts like filgrastim, pegfilgrastim, sargramostim  some other chemotherapy drugs like  cisplatin  vaccines Talk to your doctor or health care professional before taking any of these medicines:  acetaminophen  aspirin  ibuprofen  ketoprofen  naproxen This list may not describe all possible interactions. Give your health care provider a list of all the medicines, herbs, non-prescription drugs, or dietary supplements you use. Also tell them if you smoke, drink alcohol, or use illegal drugs. Some items may interact with your medicine. What should I watch for while using this medicine? Visit your doctor for checks on your progress. This drug may make you feel generally unwell. This is not uncommon, as chemotherapy can affect healthy cells as well as cancer cells. Report any side effects. Continue your course of treatment even though you feel ill unless your doctor tells you to stop. In some cases, you may be given additional medicines to help with side effects. Follow all directions for their use. Call your doctor or health care professional for advice if you get a fever, chills or sore throat, or other symptoms of a cold or flu. Do not treat yourself. This drug decreases your body's ability to fight infections. Try to avoid being around people who are sick. This medicine may increase your risk to bruise or bleed. Call your doctor or health care professional if you notice any unusual bleeding. Be careful brushing and flossing your teeth or using a toothpick because you may get an infection or bleed more easily. If you have any dental work done, tell your dentist you are receiving this medicine. Avoid taking products that contain aspirin, acetaminophen, ibuprofen, naproxen, or ketoprofen unless instructed by your doctor. These medicines may hide a fever. Do not become pregnant while taking this medicine or for 6 months after stopping it. Women should inform their doctor if they wish to become pregnant or think they might be pregnant. Men should not father a child while taking this  medicine and for 3 months after stopping it. There is a potential for serious side effects to an unborn child. Talk to your health care professional or pharmacist for more information. Do not breast-feed an infant while taking this medicine or for at least 1 week after stopping it. Men should inform their doctors if they wish to father a child. This medicine may lower sperm counts. Talk with your doctor or health care professional if you are concerned about your fertility. What side effects may I notice from receiving this medicine? Side effects that you should report to your doctor or health care professional as soon as possible:  allergic reactions like skin rash, itching or hives, swelling of the face, lips, or tongue  breathing problems  pain, redness, or irritation at site where injected  signs and symptoms of a dangerous change in heartbeat or heart rhythm like chest pain; dizziness; fast or irregular heartbeat; palpitations; feeling faint or lightheaded, falls; breathing problems  signs of decreased platelets or bleeding - bruising, pinpoint red spots on the skin, black, tarry stools, blood   in the urine  signs of decreased red blood cells - unusually weak or tired, feeling faint or lightheaded, falls  signs of infection - fever or chills, cough, sore throat, pain or difficulty passing urine  signs and symptoms of kidney injury like trouble passing urine or change in the amount of urine  signs and symptoms of liver injury like dark yellow or brown urine; general ill feeling or flu-like symptoms; light-colored stools; loss of appetite; nausea; right upper belly pain; unusually weak or tired; yellowing of the eyes or skin  swelling of ankles, feet, hands Side effects that usually do not require medical attention (report to your doctor or health care professional if they continue or are bothersome):  constipation  diarrhea  hair loss  loss of  appetite  nausea  rash  vomiting This list may not describe all possible side effects. Call your doctor for medical advice about side effects. You may report side effects to FDA at 1-800-FDA-1088. Where should I keep my medicine? This drug is given in a hospital or clinic and will not be stored at home. NOTE: This sheet is a summary. It may not cover all possible information. If you have questions about this medicine, talk to your doctor, pharmacist, or health care provider.  2020 Elsevier/Gold Standard (2017-12-03 18:06:11)  

## 2020-08-10 ENCOUNTER — Other Ambulatory Visit: Payer: Self-pay

## 2020-08-10 ENCOUNTER — Inpatient Hospital Stay: Payer: BC Managed Care – PPO

## 2020-08-10 ENCOUNTER — Telehealth: Payer: Self-pay | Admitting: *Deleted

## 2020-08-10 ENCOUNTER — Inpatient Hospital Stay: Payer: BC Managed Care – PPO | Admitting: Nurse Practitioner

## 2020-08-10 ENCOUNTER — Other Ambulatory Visit: Payer: BC Managed Care – PPO

## 2020-08-10 NOTE — Telephone Encounter (Signed)
Telephone call to pt. Her husband answered and told me he is taking calls on her behalf. I explained to him that a nurse practitioner was unable to make it to her chemo class to go over the treatment plan and any questions you may have. Husband said that the NP was great and very informative. I asked if that was [place], he said yes. He said he didn't have any questions and declined a further phone call or virtual visit with another NP.

## 2020-08-11 NOTE — Progress Notes (Signed)
Tumor Board Documentation  Lauren Lindsey was presented by Dr [name] at our Tumor Board on 08/10/2020, which included representatives from medical oncology, radiation oncology, surgical oncology, internal medicine, navigation, pathology, radiology, surgical, pharmacy, genetics, research, palliative care, pulmonology.  Lauren Lindsey currently presents as a current patient, for Lauren Lindsey, for new positive pathology with history of the following treatments: active survellience, surgical intervention(s).  Additionally, we reviewed previous medical and familial history, history of present illness, and recent lab results along with all available histopathologic and imaging studies. The tumor board considered available treatment options and made the following recommendations: Chemotherapy (Cisplatin and Gemzar)    The following procedures/referrals were also placed: No orders of the defined types were placed in this encounter.   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  Tumor board is a meeting of clinicians from various specialty areas who evaluate and discuss patients for whom a multidisciplinary approach is being considered. Final determinations in the plan of care are those of the provider(s). The responsibility for follow up of recommendations given during tumor board is that of the provider.   Today's extended care, comprehensive team conference, Lauren Lindsey was not present for the discussion and was not examined.   Multidisciplinary Tumor Board is a multidisciplinary case peer review process.  Decisions discussed in the Multidisciplinary Tumor Board reflect the opinions of the specialists present at the conference without having examined the patient.  Ultimately, treatment and diagnostic decisions  rest with the primary provider(s) and the patient.

## 2020-08-16 NOTE — Progress Notes (Signed)
[facility]  Telephone:([phone] Fax:([phone]) [phone]  ID: [name] OB: [birth year]  MR#: [id number]  PJA#:[id number]  Patient Care Team: [name], MD as PCP - General (Family Medicine) Patient, No Pcp Per (General Practice) [name], RN as Oncology Nurse Navigator  CHIEF COMPLAINT: Stage IV intrahepatic cholangiocarcinoma.  INTERVAL HISTORY: Patient returns to clinic today for further evaluation and consideration of cycle 1, day 1 of cisplatin and gemcitabine.  She currently has sinus pressure and drainage along with low-grade fevers.  She also describes chills over the past week.  She otherwise feels well.  She has no neurologic complaints. She has a good appetite and denies weight loss.  She has no chest pain, shortness of breath, cough, or hemoptysis. Her abdominal pain resolved with her cholecystectomy.  She denies any nausea, vomiting, or constipation.  But does admit to occasional diarrhea.  She has no melena or hematochezia.  She has no urinary complaints.  Patient offers no further specific complaints today.  REVIEW OF SYSTEMS:   Review of Systems  Constitutional: Positive for fever. Negative for malaise/fatigue and weight loss.  HENT: Positive for sinus pain.   Respiratory: Negative.  Negative for cough, hemoptysis and shortness of breath.   Cardiovascular: Negative.  Negative for chest pain and leg swelling.  Gastrointestinal: Positive for diarrhea. Negative for abdominal pain, blood in stool, constipation, melena, nausea and vomiting.  Genitourinary: Negative.  Negative for dysuria.  Musculoskeletal: Negative.   Skin: Negative.  Negative for rash.  Neurological: Negative.  Negative for dizziness, focal weakness, weakness and headaches.  Psychiatric/Behavioral: Negative.  The patient is not nervous/anxious.     As per HPI. Otherwise, a complete review of systems is negative.  PAST MEDICAL HISTORY: Past Medical History:  Diagnosis  Date  . Abnormal Pap smear of cervix   . Anemia    h/o  . Diabetes mellitus without complication ([place])     PAST SURGICAL HISTORY: Past Surgical History:  Procedure Laterality Date  . COLONOSCOPY WITH PROPOFOL N/A 06/15/2020   Procedure: COLONOSCOPY WITH PROPOFOL;  Surgeon: Lin Landsman, MD;  Location: [facility];  Service: Endoscopy;  Laterality: N/A;  Diabetic - oral meds  . ENDOMETRIAL ABLATION    . HYSTEROSCOPY WITH D & C    . LEEP      FAMILY HISTORY: Family History  Problem Relation Age of Onset  . Bleeding Disorder Mother   . Biliary Cirrhosis Mother   . Liver cancer Mother   . Hypertension Mother   . Diabetes Father   . Lung cancer Father   . Breast cancer Neg Hx     ADVANCED DIRECTIVES (Y/N):  N  HEALTH MAINTENANCE: Social History   Tobacco Use  . Smoking status: Never Smoker  . Smokeless tobacco: Never Used  Vaping Use  . Vaping Use: Never used  Substance Use Topics  . Alcohol use: No  . Drug use: No     Colonoscopy:  PAP:  Bone density:  Lipid panel:  No Known Allergies  Current Outpatient Medications  Medication Sig Dispense Refill  . metFORMIN (GLUCOPHAGE) 500 MG tablet Take 500 mg by mouth 2 (two) times daily with a meal.      No current facility-administered medications for this visit.    OBJECTIVE: Vitals:   08/23/20 0825  BP: (!) 142/73  Pulse: 88  Resp: 18  Temp: (!) 100.9 F (38.3 C)  SpO2: 100%     Body mass index is 27.63 kg/m.  ECOG FS:0 - Asymptomatic  General: Well-developed, well-nourished, no acute distress. Eyes: Pink conjunctiva, anicteric sclera. HEENT: Normocephalic, moist mucous membranes. Lungs: No audible wheezing or coughing. Heart: Regular rate and rhythm. Abdomen: Soft, nontender, no obvious distention. Musculoskeletal: No edema, cyanosis, or clubbing. Neuro: Alert, answering all questions appropriately. Cranial nerves grossly intact. Skin: No rashes or petechiae noted. Psych: Normal  affect.   LAB RESULTS:  Lab Results  Component Value Date   NA 139 08/23/2020   K 3.9 08/23/2020   CL 103 08/23/2020   CO2 24 08/23/2020   GLUCOSE 120 (H) 08/23/2020   BUN 12 08/23/2020   CREATININE 0.57 08/23/2020   CALCIUM 8.5 (L) 08/23/2020   PROT 8.0 08/23/2020   ALBUMIN 3.2 (L) 08/23/2020   AST 26 08/23/2020   ALT 28 08/23/2020   ALKPHOS 59 08/23/2020   BILITOT 0.6 08/23/2020   GFRNONAA >60 08/23/2020   GFRAA >60 05/23/2020    Lab Results  Component Value Date   WBC 11.1 (H) 08/23/2020   NEUTROABS 7.5 08/23/2020   HGB 10.1 (L) 08/23/2020   HCT 33.1 (L) 08/23/2020   MCV 78.1 (L) 08/23/2020   PLT 557 (H) 08/23/2020     STUDIES: US BIOPSY (LIVER)  Result Date: 07/27/2020 INDICATION: Peritoneal carcinomatosis incidentally noted on routine cholecystectomy. Patient now presents for ultrasound-guided liver lesion biopsy for the acquisition of additional tissue for diagnostic purposes. EXAM: ULTRASOUND GUIDED LIVER LESION BIOPSY COMPARISON:  CT abdomen pelvis-06/16/2020; PET-CT-07/14/2020;-07/21/2020 MEDICATIONS: None ANESTHESIA/SEDATION: Fentanyl 50 mcg IV; Versed 1 mg IV Total Moderate Sedation time:  10 Minutes. The patient's level of consciousness and vital signs were monitored continuously by radiology nursing throughout the procedure under my direct supervision. COMPLICATIONS: None immediate. PROCEDURE: Informed written consent was obtained from the patient after a discussion of the risks, benefits and alternatives to treatment. The patient understands and consents the procedure. A timeout was performed prior to the initiation of the procedure. Ultrasound scanning was performed of the right upper abdominal quadrant demonstrates an infiltrative hypoechoic mass involving the subcapsular aspect of the anterior segment of the right lobe of the liver measuring at least 4.6 x 2.8 x 3.5 cm (images 2 through 9) as well as an adjacent satellite lesion measuring approximately 1.9 x  1.2 cm (image 10). Dominant hypoechoic lesion was targeted for biopsy and the procedure was planned. The right upper abdominal quadrant was prepped and draped in the usual sterile fashion. The overlying soft tissues were anesthetized with 1% lidocaine with epinephrine. A 17 gauge, 6.8 cm co-axial needle was advanced into a peripheral aspect of the lesion. This was followed by 5 core biopsies with an 18 gauge core device under direct ultrasound guidance. The coaxial needle tract was embolized with a small amount of Gel-Foam slurry and superficial hemostasis was obtained with manual compression. Post procedural scanning was negative for definitive area of hemorrhage or additional complication. A dressing was placed. The patient tolerated the procedure well without immediate post procedural complication. IMPRESSION: Technically successful ultrasound guided core needle biopsy of infiltrative mass involving the subcapsular aspect of the anterior segment of the right lobe of the liver. Electronically Signed   By: Sandi Mariscal M.D.   On: 07/27/2020 12:20    ASSESSMENT: Stage IV intrahepatic cholangiocarcinoma.  PLAN:    1.  Stage IV intrahepatic cholangiocarcinoma: Incidental finding on routine cholecystectomy.  PET scan results from July 13, 2020 as well as MRI results from July 20, 2020 reviewed independently and report as above with right lobe liver  lesion consistent with intrahepatic cholangiocarcinoma.  Biopsy confirmed the results.  Gynecologic work-up is essentially negative.  Colonoscopy on June 15, 2020 was essentially within normal limits.  Mammogram on July 19, 2020 was reported as BI-RADS 2. All patient tumor markers are negative.  Patient has declined port placement.  Plan to give cisplatin and gemcitabine on day one with gemcitabine only on day eight.  This will be a 21-day cycle and will reimage after four cycles.  Delay cycle 1, day 1 of treatment secondary to sinus infection and fever.   Return to clinic in 1 week for further evaluation and reconsideration of treatment. 2.  Sinus infection/fever: Patient was given a prescription for Augmentin. 3.  Diarrhea: Possibly dietary related.  Recommended over-the-counter Imodium if needed.   Patient expressed understanding and was in agreement with this plan. She also understands that She can call clinic at any time with any questions, concerns, or complaints.   Cancer Staging Intrahepatic cholangiocarcinoma ([place]) Staging form: Intrahepatic Bile Duct, AJCC 8th Edition - Clinical stage from 08/04/2020: Stage IV (cT1b, cN0, pM1) - Signed by Lloyd Huger, MD on 08/04/2020   Lloyd Huger, MD   08/23/2020 9:26 AM

## 2020-08-22 ENCOUNTER — Other Ambulatory Visit: Payer: Self-pay | Admitting: *Deleted

## 2020-08-22 DIAGNOSIS — C221 Intrahepatic bile duct carcinoma: Secondary | ICD-10-CM

## 2020-08-23 ENCOUNTER — Other Ambulatory Visit: Payer: BC Managed Care – PPO

## 2020-08-23 ENCOUNTER — Inpatient Hospital Stay: Payer: BC Managed Care – PPO

## 2020-08-23 ENCOUNTER — Encounter: Payer: Self-pay | Admitting: Oncology

## 2020-08-23 ENCOUNTER — Inpatient Hospital Stay: Payer: BC Managed Care – PPO | Attending: Oncology

## 2020-08-23 ENCOUNTER — Other Ambulatory Visit: Payer: Self-pay

## 2020-08-23 ENCOUNTER — Inpatient Hospital Stay (HOSPITAL_BASED_OUTPATIENT_CLINIC_OR_DEPARTMENT_OTHER): Payer: BC Managed Care – PPO | Admitting: Oncology

## 2020-08-23 VITALS — BP 142/73 | HR 88 | Temp 100.9°F | Resp 18 | Wt 166.1 lb

## 2020-08-23 DIAGNOSIS — D72829 Elevated white blood cell count, unspecified: Secondary | ICD-10-CM | POA: Insufficient documentation

## 2020-08-23 DIAGNOSIS — R197 Diarrhea, unspecified: Secondary | ICD-10-CM | POA: Diagnosis not present

## 2020-08-23 DIAGNOSIS — J329 Chronic sinusitis, unspecified: Secondary | ICD-10-CM | POA: Insufficient documentation

## 2020-08-23 DIAGNOSIS — R109 Unspecified abdominal pain: Secondary | ICD-10-CM | POA: Insufficient documentation

## 2020-08-23 DIAGNOSIS — Z801 Family history of malignant neoplasm of trachea, bronchus and lung: Secondary | ICD-10-CM | POA: Diagnosis not present

## 2020-08-23 DIAGNOSIS — Z5111 Encounter for antineoplastic chemotherapy: Secondary | ICD-10-CM | POA: Insufficient documentation

## 2020-08-23 DIAGNOSIS — Z803 Family history of malignant neoplasm of breast: Secondary | ICD-10-CM | POA: Diagnosis not present

## 2020-08-23 DIAGNOSIS — C221 Intrahepatic bile duct carcinoma: Secondary | ICD-10-CM

## 2020-08-23 DIAGNOSIS — E119 Type 2 diabetes mellitus without complications: Secondary | ICD-10-CM | POA: Diagnosis not present

## 2020-08-23 DIAGNOSIS — Z7984 Long term (current) use of oral hypoglycemic drugs: Secondary | ICD-10-CM | POA: Diagnosis not present

## 2020-08-23 DIAGNOSIS — R0981 Nasal congestion: Secondary | ICD-10-CM | POA: Diagnosis not present

## 2020-08-23 DIAGNOSIS — D649 Anemia, unspecified: Secondary | ICD-10-CM | POA: Diagnosis not present

## 2020-08-23 DIAGNOSIS — C786 Secondary malignant neoplasm of retroperitoneum and peritoneum: Secondary | ICD-10-CM | POA: Diagnosis not present

## 2020-08-23 DIAGNOSIS — D75839 Thrombocytosis, unspecified: Secondary | ICD-10-CM | POA: Insufficient documentation

## 2020-08-23 LAB — CBC WITH DIFFERENTIAL/PLATELET
Abs Immature Granulocytes: 0.04 10*3/uL (ref 0.00–0.07)
Basophils Absolute: 0.1 10*3/uL (ref 0.0–0.1)
Basophils Relative: 1 %
Eosinophils Absolute: 0.7 10*3/uL — ABNORMAL HIGH (ref 0.0–0.5)
Eosinophils Relative: 7 %
HCT: 33.1 % — ABNORMAL LOW (ref 36.0–46.0)
Hemoglobin: 10.1 g/dL — ABNORMAL LOW (ref 12.0–15.0)
Immature Granulocytes: 0 %
Lymphocytes Relative: 16 %
Lymphs Abs: 1.8 10*3/uL (ref 0.7–4.0)
MCH: 23.8 pg — ABNORMAL LOW (ref 26.0–34.0)
MCHC: 30.5 g/dL (ref 30.0–36.0)
MCV: 78.1 fL — ABNORMAL LOW (ref 80.0–100.0)
Monocytes Absolute: 1 10*3/uL (ref 0.1–1.0)
Monocytes Relative: 9 %
Neutro Abs: 7.5 10*3/uL (ref 1.7–7.7)
Neutrophils Relative %: 67 %
Platelets: 557 10*3/uL — ABNORMAL HIGH (ref 150–400)
RBC: 4.24 MIL/uL (ref 3.87–5.11)
RDW: 16.4 % — ABNORMAL HIGH (ref 11.5–15.5)
WBC: 11.1 10*3/uL — ABNORMAL HIGH (ref 4.0–10.5)
nRBC: 0 % (ref 0.0–0.2)

## 2020-08-23 LAB — COMPREHENSIVE METABOLIC PANEL
ALT: 28 U/L (ref 0–44)
AST: 26 U/L (ref 15–41)
Albumin: 3.2 g/dL — ABNORMAL LOW (ref 3.5–5.0)
Alkaline Phosphatase: 59 U/L (ref 38–126)
Anion gap: 12 (ref 5–15)
BUN: 12 mg/dL (ref 6–20)
CO2: 24 mmol/L (ref 22–32)
Calcium: 8.5 mg/dL — ABNORMAL LOW (ref 8.9–10.3)
Chloride: 103 mmol/L (ref 98–111)
Creatinine, Ser: 0.57 mg/dL (ref 0.44–1.00)
GFR, Estimated: >60 mL/min (ref >60)
Glucose, Bld: 120 mg/dL — ABNORMAL HIGH (ref 70–99)
Potassium: 3.9 mmol/L (ref 3.5–5.1)
Sodium: 139 mmol/L (ref 135–145)
Total Bilirubin: 0.6 mg/dL (ref 0.3–1.2)
Total Protein: 8 g/dL (ref 6.5–8.1)

## 2020-08-23 MED ORDER — AMOXICILLIN-POT CLAVULANATE 875-125 MG PO TABS: 1.0000 | ORAL_TABLET | Freq: Two times a day (BID) | ORAL | 0 refills | Status: DC

## 2020-08-23 NOTE — Progress Notes (Signed)
Pt has been febrile x 1 week. Today tmax 100.9 along with sinus pressure, headache and nasal congestion.

## 2020-08-26 NOTE — Progress Notes (Signed)
[place]  [phone]) [phone] Fax:(336) [phone]  ID: Lauren Lindsey OB: 1961-02-20  MR#: [id number]  YHC#:[id number]  Patient Care Team: [name], MD as PCP - General (Family Medicine) Patient, No Pcp Per (General Practice) [name], RN as Oncology Nurse Navigator  CHIEF COMPLAINT: Stage IV intrahepatic cholangiocarcinoma.  INTERVAL HISTORY: Patient returns to clinic today for further evaluation and reconsideration of cycle 1, day 1 of cisplatin and gemcitabine.  She continues to have low-grade fevers and sinus congestion despite completing 7 days worth of antibiotics.  She continues to have weakness and fatigue.  She continues to have intermittent pain in her right flank at the site of her biopsy.  She has no neurologic complaints. She has a good appetite and denies weight loss.  She has no chest pain, shortness of breath, cough, or hemoptysis.  She denies any nausea, vomiting, constipation, or diarrhea.  She has no melena or hematochezia.  She has no urinary complaints.  Patient offers no further specific complaints today.  REVIEW OF SYSTEMS:   Review of Systems  Constitutional: Positive for fever and malaise/fatigue. Negative for weight loss.  HENT: Positive for sinus pain.   Respiratory: Negative.  Negative for cough, hemoptysis and shortness of breath.   Cardiovascular: Negative.  Negative for chest pain and leg swelling.  Gastrointestinal: Negative.  Negative for abdominal pain, blood in stool, constipation, diarrhea, melena, nausea and vomiting.  Genitourinary: Positive for flank pain. Negative for dysuria.  Musculoskeletal: Negative for back pain.  Skin: Negative.  Negative for rash.  Neurological: Positive for weakness. Negative for dizziness, focal weakness and headaches.  Psychiatric/Behavioral: The patient is nervous/anxious.     As per HPI. Otherwise, a complete review of systems is negative.  PAST MEDICAL HISTORY: Past Medical  History:  Diagnosis Date  . Abnormal Pap smear of cervix   . Anemia    h/o  . Diabetes mellitus without complication ([place])     PAST SURGICAL HISTORY: Past Surgical History:  Procedure Laterality Date  . COLONOSCOPY WITH PROPOFOL N/A 06/15/2020   Procedure: COLONOSCOPY WITH PROPOFOL;  Surgeon: Lin Landsman, MD;  Location: [facility];  Service: Endoscopy;  Laterality: N/A;  Diabetic - oral meds  . ENDOMETRIAL ABLATION    . HYSTEROSCOPY WITH D & C    . LEEP      FAMILY HISTORY: Family History  Problem Relation Age of Onset  . Bleeding Disorder Mother   . Biliary Cirrhosis Mother   . Liver cancer Mother   . Hypertension Mother   . Diabetes Father   . Lung cancer Father   . Breast cancer Neg Hx     ADVANCED DIRECTIVES (Y/N):  N  HEALTH MAINTENANCE: Social History   Tobacco Use  . Smoking status: Never Smoker  . Smokeless tobacco: Never Used  Vaping Use  . Vaping Use: Never used  Substance Use Topics  . Alcohol use: No  . Drug use: No     Colonoscopy:  PAP:  Bone density:  Lipid panel:  No Known Allergies  Current Outpatient Medications  Medication Sig Dispense Refill  . metFORMIN (GLUCOPHAGE) 500 MG tablet Take 500 mg by mouth 2 (two) times daily with a meal.     . ondansetron (ZOFRAN) 8 MG tablet Take 1 tablet (8 mg total) by mouth every 8 (eight) hours as needed for nausea or vomiting. 30 tablet 2  . prochlorperazine (COMPAZINE) 10 MG tablet Take 1 tablet (10 mg total) by mouth every  6 (six) hours as needed for nausea or vomiting. 30 tablet 2   No current facility-administered medications for this visit.    OBJECTIVE: Vitals:   08/31/20 0811  BP: 108/85  Pulse: 88  Resp: 18  Temp: 100.1 F (37.8 C)  SpO2: 100%     Body mass index is 26.89 kg/m.    ECOG FS:0 - Asymptomatic  General: Well-developed, well-nourished, no acute distress. Eyes: Pink conjunctiva, anicteric sclera. HEENT: Normocephalic, moist mucous membranes. Lungs: No  audible wheezing or coughing. Heart: Regular rate and rhythm. Abdomen: Soft, nontender, no obvious distention. Musculoskeletal: No edema, cyanosis, or clubbing. Neuro: Alert, answering all questions appropriately. Cranial nerves grossly intact. Skin: No rashes or petechiae noted. Psych: Normal affect.  LAB RESULTS:  Lab Results  Component Value Date   NA 137 08/31/2020   K 4.0 08/31/2020   CL 100 08/31/2020   CO2 28 08/31/2020   GLUCOSE 133 (H) 08/31/2020   BUN 11 08/31/2020   CREATININE 0.71 08/31/2020   CALCIUM 8.5 (L) 08/31/2020   PROT 7.7 08/31/2020   ALBUMIN 3.1 (L) 08/31/2020   AST 38 08/31/2020   ALT 48 (H) 08/31/2020   ALKPHOS 74 08/31/2020   BILITOT 0.8 08/31/2020   GFRNONAA >60 08/31/2020   GFRAA >60 05/23/2020    Lab Results  Component Value Date   WBC 13.5 (H) 08/31/2020   NEUTROABS 9.5 (H) 08/31/2020   HGB 10.0 (L) 08/31/2020   HCT 31.7 (L) 08/31/2020   MCV 76.6 (L) 08/31/2020   PLT 620 (H) 08/31/2020     STUDIES: No results found.  ASSESSMENT: Stage IV intrahepatic cholangiocarcinoma.  PLAN:    1.  Stage IV intrahepatic cholangiocarcinoma: Incidental finding on routine cholecystectomy. PET scan results from July 13, 2020 as well as MRI results from July 20, 2020 reviewed independently with right lobe liver lesion consistent with intrahepatic cholangiocarcinoma.  Biopsy confirmed the results.  Gynecologic work-up is essentially negative.  Colonoscopy on June 15, 2020 was essentially within normal limits.  Mammogram on July 19, 2020 was reported as BI-RADS 2. All patient tumor markers are negative.  Patient has declined port placement at this time.  Plan to give cisplatin and gemcitabine on day one with gemcitabine only on day eight.  This will be a 21-day cycle and will reimage after four cycles.  Despite low-grade fevers, will proceed with cycle 1, day 1 of treatment today.  Return to clinic in 1 week for further evaluation and consideration  of cycle 1, day 8 which is gemcitabine only.   2.  Sinus infection/fever: Improved.  Patient has completed a 7-day course of Augmentin. 3.  Diarrhea: Patient does not complain of this today. 4.  Leukocytosis: Possibly reactive, monitor. 5.  Anemia: Chronic and unchanged.  Patient's hemoglobin is 10.0 today. 6.  Thrombocytosis: Reactive, monitor. 7.  Flank pain: Will get abdominal ultrasound for further evaluation.   Patient expressed understanding and was in agreement with this plan. She also understands that She can call clinic at any time with any questions, concerns, or complaints.   Cancer Staging Intrahepatic cholangiocarcinoma ([place]) Staging form: Intrahepatic Bile Duct, AJCC 8th Edition - Clinical stage from 08/04/2020: Stage IV (cT1b, cN0, pM1) - Signed by Lloyd Huger, MD on 08/04/2020   Lloyd Huger, MD   09/01/2020 6:14 AM

## 2020-08-30 ENCOUNTER — Ambulatory Visit: Payer: BC Managed Care – PPO

## 2020-08-30 ENCOUNTER — Ambulatory Visit: Payer: BC Managed Care – PPO | Admitting: Oncology

## 2020-08-30 ENCOUNTER — Other Ambulatory Visit: Payer: BC Managed Care – PPO

## 2020-08-31 ENCOUNTER — Inpatient Hospital Stay (HOSPITAL_BASED_OUTPATIENT_CLINIC_OR_DEPARTMENT_OTHER): Payer: BC Managed Care – PPO | Admitting: Oncology

## 2020-08-31 ENCOUNTER — Inpatient Hospital Stay: Payer: BC Managed Care – PPO

## 2020-08-31 ENCOUNTER — Encounter: Payer: Self-pay | Admitting: Oncology

## 2020-08-31 ENCOUNTER — Other Ambulatory Visit: Payer: Self-pay

## 2020-08-31 VITALS — BP 108/85 | HR 88 | Temp 100.1°F | Resp 18 | Wt 161.6 lb

## 2020-08-31 DIAGNOSIS — C221 Intrahepatic bile duct carcinoma: Secondary | ICD-10-CM | POA: Diagnosis not present

## 2020-08-31 DIAGNOSIS — R1031 Right lower quadrant pain: Secondary | ICD-10-CM

## 2020-08-31 LAB — COMPREHENSIVE METABOLIC PANEL
ALT: 48 U/L — ABNORMAL HIGH (ref 0–44)
AST: 38 U/L (ref 15–41)
Albumin: 3.1 g/dL — ABNORMAL LOW (ref 3.5–5.0)
Alkaline Phosphatase: 74 U/L (ref 38–126)
Anion gap: 9 (ref 5–15)
BUN: 11 mg/dL (ref 6–20)
CO2: 28 mmol/L (ref 22–32)
Calcium: 8.5 mg/dL — ABNORMAL LOW (ref 8.9–10.3)
Chloride: 100 mmol/L (ref 98–111)
Creatinine, Ser: 0.71 mg/dL (ref 0.44–1.00)
GFR, Estimated: >60 mL/min (ref >60)
Glucose, Bld: 133 mg/dL — ABNORMAL HIGH (ref 70–99)
Potassium: 4 mmol/L (ref 3.5–5.1)
Sodium: 137 mmol/L (ref 135–145)
Total Bilirubin: 0.8 mg/dL (ref 0.3–1.2)
Total Protein: 7.7 g/dL (ref 6.5–8.1)

## 2020-08-31 LAB — CBC WITH DIFFERENTIAL/PLATELET
Abs Immature Granulocytes: 0.07 10*3/uL (ref 0.00–0.07)
Basophils Absolute: 0.1 10*3/uL (ref 0.0–0.1)
Basophils Relative: 1 %
Eosinophils Absolute: 1 10*3/uL — ABNORMAL HIGH (ref 0.0–0.5)
Eosinophils Relative: 8 %
HCT: 31.7 % — ABNORMAL LOW (ref 36.0–46.0)
Hemoglobin: 10 g/dL — ABNORMAL LOW (ref 12.0–15.0)
Immature Granulocytes: 1 %
Lymphocytes Relative: 14 %
Lymphs Abs: 1.8 10*3/uL (ref 0.7–4.0)
MCH: 24.2 pg — ABNORMAL LOW (ref 26.0–34.0)
MCHC: 31.5 g/dL (ref 30.0–36.0)
MCV: 76.6 fL — ABNORMAL LOW (ref 80.0–100.0)
Monocytes Absolute: 1 10*3/uL (ref 0.1–1.0)
Monocytes Relative: 7 %
Neutro Abs: 9.5 10*3/uL — ABNORMAL HIGH (ref 1.7–7.7)
Neutrophils Relative %: 69 %
Platelets: 620 10*3/uL — ABNORMAL HIGH (ref 150–400)
RBC: 4.14 MIL/uL (ref 3.87–5.11)
RDW: 16.1 % — ABNORMAL HIGH (ref 11.5–15.5)
WBC: 13.5 10*3/uL — ABNORMAL HIGH (ref 4.0–10.5)
nRBC: 0 % (ref 0.0–0.2)

## 2020-08-31 MED ORDER — SODIUM CHLORIDE 0.9 % IV SOLN
150.0000 mg | Freq: Once | INTRAVENOUS | Status: AC
  Administered 2020-08-31: 150 mg via INTRAVENOUS
  Filled 2020-08-31: qty 150

## 2020-08-31 MED ORDER — SODIUM CHLORIDE 0.9 % IV SOLN
1000.0000 mg/m2 | Freq: Once | INTRAVENOUS | Status: AC
  Administered 2020-08-31: 1862 mg via INTRAVENOUS
  Filled 2020-08-31: qty 48.97

## 2020-08-31 MED ORDER — PALONOSETRON HCL INJECTION 0.25 MG/5ML
0.2500 mg | Freq: Once | INTRAVENOUS | Status: AC
  Administered 2020-08-31: 0.25 mg via INTRAVENOUS
  Filled 2020-08-31: qty 5

## 2020-08-31 MED ORDER — PROCHLORPERAZINE MALEATE 10 MG PO TABS: 10.0000 mg | ORAL_TABLET | Freq: Four times a day (QID) | ORAL | 2 refills | Status: DC | PRN

## 2020-08-31 MED ORDER — SODIUM CHLORIDE 0.9 % IV SOLN
10.0000 mg | Freq: Once | INTRAVENOUS | Status: AC
  Administered 2020-08-31: 10 mg via INTRAVENOUS
  Filled 2020-08-31: qty 10

## 2020-08-31 MED ORDER — SODIUM CHLORIDE 0.9 % IV SOLN
80.0000 mg/m2 | Freq: Once | INTRAVENOUS | Status: AC
  Administered 2020-08-31: 148 mg via INTRAVENOUS
  Filled 2020-08-31: qty 100

## 2020-08-31 MED ORDER — SODIUM CHLORIDE 0.9 % IV SOLN
Freq: Once | INTRAVENOUS | Status: AC
  Filled 2020-08-31: qty 10

## 2020-08-31 MED ORDER — ONDANSETRON HCL 8 MG PO TABS: 8.0000 mg | ORAL_TABLET | Freq: Three times a day (TID) | ORAL | 2 refills | Status: AC | PRN

## 2020-08-31 MED ORDER — SODIUM CHLORIDE 0.9 % IV SOLN
Freq: Once | INTRAVENOUS | Status: AC
  Filled 2020-08-31: qty 250

## 2020-08-31 NOTE — Progress Notes (Signed)
Temp 100.1 MD ok to continue tx today

## 2020-08-31 NOTE — Progress Notes (Signed)
Pt febrile x1.5 weeks. Finished augmentin rx still with cough and runny nose. COVID neg. She complains of fatigue and intermittent, right sided, stabbing abdominal pain.

## 2020-09-01 ENCOUNTER — Telehealth: Payer: Self-pay

## 2020-09-01 ENCOUNTER — Other Ambulatory Visit: Payer: Self-pay | Admitting: Oncology

## 2020-09-01 ENCOUNTER — Other Ambulatory Visit: Payer: Self-pay

## 2020-09-01 ENCOUNTER — Ambulatory Visit
Admission: RE | Admit: 2020-09-01 | Discharge: 2020-09-01 | Disposition: A | Discharge status: Home / Self Care | Payer: BC Managed Care – PPO | Source: Ambulatory Visit | Attending: Oncology | Admitting: Oncology

## 2020-09-01 DIAGNOSIS — R1031 Right lower quadrant pain: Secondary | ICD-10-CM | POA: Diagnosis present

## 2020-09-01 DIAGNOSIS — R1011 Right upper quadrant pain: Secondary | ICD-10-CM | POA: Diagnosis present

## 2020-09-01 NOTE — Telephone Encounter (Signed)
Telephone call to patient for follow up after receiving first infusion.   Patient husband states infusion went great.  States eating good and drinking plenty of fluids.   Denies any nausea or vomiting.  Encouraged patient to call for any concerns or questions.  

## 2020-09-02 NOTE — Progress Notes (Signed)
[place]  Telephone:(336) [phone] Fax:(336) [phone]  ID: Lauren Lindsey OB: 1961-04-07  MR#: [id number]  OIN#:[id number]  Patient Care Team: [name], MD as PCP - General (Family Medicine) Patient, No Pcp Per (General Practice) [name], RN as Oncology Nurse Navigator  CHIEF COMPLAINT: Stage IV intrahepatic cholangiocarcinoma.  INTERVAL HISTORY: Patient returns to clinic today for further evaluation and consideration of cycle 1, day 8 of cisplatin and gemcitabine.  Her fevers have resolved, but she continues to have residual congestion.  She tolerated her treatment relatively well only with several days of significant fatigue and poor appetite.  She continues to have intermittent flank pain.  She has no neurologic complaints. She has a fair appetite and denies weight loss.  She has no chest pain, shortness of breath, cough, or hemoptysis.  She denies any nausea, vomiting, constipation, or diarrhea.  She has no melena or hematochezia.  She has no urinary complaints.  Patient offers no further specific complaints today.  REVIEW OF SYSTEMS:   Review of Systems  Constitutional: Positive for malaise/fatigue. Negative for fever and weight loss.  HENT: Positive for congestion. Negative for sinus pain.   Respiratory: Negative.  Negative for cough, hemoptysis and shortness of breath.   Cardiovascular: Negative.  Negative for chest pain and leg swelling.  Gastrointestinal: Negative.  Negative for abdominal pain, blood in stool, constipation, diarrhea, melena, nausea and vomiting.  Genitourinary: Positive for flank pain. Negative for dysuria.  Musculoskeletal: Negative for back pain.  Skin: Negative.  Negative for rash.  Neurological: Positive for weakness. Negative for dizziness, focal weakness and headaches.  Psychiatric/Behavioral: The patient is nervous/anxious.     As per HPI. Otherwise, a complete review of systems is negative.  PAST MEDICAL  HISTORY: Past Medical History:  Diagnosis Date  . Abnormal Pap smear of cervix   . Anemia    h/o  . Diabetes mellitus without complication ([place])     PAST SURGICAL HISTORY: Past Surgical History:  Procedure Laterality Date  . COLONOSCOPY WITH PROPOFOL N/A 06/15/2020   Procedure: COLONOSCOPY WITH PROPOFOL;  Surgeon: Lin Landsman, MD;  Location: [facility];  Service: Endoscopy;  Laterality: N/A;  Diabetic - oral meds  . ENDOMETRIAL ABLATION    . HYSTEROSCOPY WITH D & C    . LEEP      FAMILY HISTORY: Family History  Problem Relation Age of Onset  . Bleeding Disorder Mother   . Biliary Cirrhosis Mother   . Liver cancer Mother   . Hypertension Mother   . Diabetes Father   . Lung cancer Father   . Breast cancer Neg Hx     ADVANCED DIRECTIVES (Y/N):  N  HEALTH MAINTENANCE: Social History   Tobacco Use  . Smoking status: Never Smoker  . Smokeless tobacco: Never Used  Vaping Use  . Vaping Use: Never used  Substance Use Topics  . Alcohol use: No  . Drug use: No     Colonoscopy:  PAP:  Bone density:  Lipid panel:  No Known Allergies  Current Outpatient Medications  Medication Sig Dispense Refill  . metFORMIN (GLUCOPHAGE) 500 MG tablet Take 500 mg by mouth 2 (two) times daily with a meal.     . ondansetron (ZOFRAN) 8 MG tablet Take 1 tablet (8 mg total) by mouth every 8 (eight) hours as needed for nausea or vomiting. (Patient not taking: Reported on 09/07/2020) 30 tablet 2  . prochlorperazine (COMPAZINE) 10 MG tablet Take 1 tablet (10 mg  total) by mouth every 6 (six) hours as needed for nausea or vomiting. (Patient not taking: Reported on 09/07/2020) 30 tablet 2   No current facility-administered medications for this visit.    OBJECTIVE: Vitals:   09/07/20 0909  BP: 128/60  Pulse: 84  Temp: 98.2 F (36.8 C)  SpO2: 100%     Body mass index is 26.51 kg/m.    ECOG FS:0 - Asymptomatic  General: Well-developed, well-nourished, no acute  distress. Eyes: Pink conjunctiva, anicteric sclera. HEENT: Normocephalic, moist mucous membranes. Lungs: No audible wheezing or coughing. Heart: Regular rate and rhythm. Abdomen: Soft, nontender, no obvious distention. Musculoskeletal: No edema, cyanosis, or clubbing. Neuro: Alert, answering all questions appropriately. Cranial nerves grossly intact. Skin: No rashes or petechiae noted. Psych: Normal affect.  LAB RESULTS:  Lab Results  Component Value Date   NA 132 (L) 09/07/2020   K 4.3 09/07/2020   CL 95 (L) 09/07/2020   CO2 27 09/07/2020   GLUCOSE 140 (H) 09/07/2020   BUN 10 09/07/2020   CREATININE 0.58 09/07/2020   CALCIUM 8.6 (L) 09/07/2020   PROT 8.1 09/07/2020   ALBUMIN 3.2 (L) 09/07/2020   AST 32 09/07/2020   ALT 59 (H) 09/07/2020   ALKPHOS 79 09/07/2020   BILITOT 0.5 09/07/2020   GFRNONAA >60 09/07/2020   GFRAA >60 05/23/2020    Lab Results  Component Value Date   WBC 10.7 (H) 09/07/2020   NEUTROABS 7.1 09/07/2020   HGB 10.6 (L) 09/07/2020   HCT 33.7 (L) 09/07/2020   MCV 76.1 (L) 09/07/2020   PLT 445 (H) 09/07/2020     STUDIES: US Abdomen Complete  Result Date: 09/01/2020 CLINICAL DATA:  Right upper quadrant pain. History of cholecystectomy. History of peritoneal carcinomatosis. History of biopsy-proven carcinoma right lobe of liver. EXAM: ABDOMEN ULTRASOUND COMPLETE COMPARISON:  MRI liver 07/20/2020 FINDINGS: Gallbladder: Surgically absent Common bile duct: Diameter: 4 mm Liver: Hypoechoic mass in the anterior right lobe of the liver measuring 5.7 x 4.5 x 3.7 cm. Portal vein is patent on color Doppler imaging with normal direction of blood flow towards the liver. IVC: No abnormality visualized. Pancreas: Visualized portion unremarkable. Spleen: Size and appearance within normal limits. Right Kidney: Length: 13.3 cm. 2 cm right parapelvic cyst. Echogenicity within normal limits. No mass or hydronephrosis visualized. Left Kidney: Length: 11.7 cm. Echogenicity  within normal limits. No mass or hydronephrosis visualized. Abdominal aorta: No aneurysm visualized. Other findings: Negative for ascites. IMPRESSION: Mass lesion right lobe liver, known carcinoma. Postop cholecystectomy.  No biliary dilatation. No free fluid. Electronically Signed   By: Franchot Gallo M.D.   On: 09/01/2020 15:03    ASSESSMENT: Stage IV intrahepatic cholangiocarcinoma.  PLAN:    1.  Stage IV intrahepatic cholangiocarcinoma: Incidental finding on routine cholecystectomy. PET scan results from July 13, 2020 as well as MRI results from July 20, 2020 reviewed independently with right lobe liver lesion consistent with intrahepatic cholangiocarcinoma.  Biopsy confirmed the results.  Gynecologic work-up is essentially negative.  Colonoscopy on June 15, 2020 was essentially within normal limits.  Mammogram on July 19, 2020 was reported as BI-RADS 2. All patient tumor markers are negative.  Patient has declined port placement at this time.  Plan to give cisplatin and gemcitabine on day 1 with gemcitabine only on day 8.  This will be a 21-day cycle and will reimage after four cycles.  Proceed with cycle 1, day 8 of treatment today.  Gemcitabine only.  Return to clinic in 2 weeks for further  evaluation and consideration of cycle 2, day 1. 2.  Sinus infection/fever: Fevers have resolved.  Patient symptoms are improving. 3.  Diarrhea: Patient does not complain of this today. 4.  Leukocytosis: Improving, monitor. 5.  Anemia: Chronic and unchanged.  Patient's hemoglobin is 10.6 today. 6.  Thrombocytosis: Improving. 7.  Flank pain: Likely secondary to malignancy.  Abdominal ultrasound was unrevealing.  Continue current narcotics as scheduled.   Patient expressed understanding and was in agreement with this plan. She also understands that She can call clinic at any time with any questions, concerns, or complaints.   Cancer Staging Intrahepatic cholangiocarcinoma ([place]) Staging form:  Intrahepatic Bile Duct, AJCC 8th Edition - Clinical stage from 08/04/2020: Stage IV (cT1b, cN0, pM1) - Signed by Lloyd Huger, MD on 08/04/2020   Lloyd Huger, MD   09/07/2020 1:59 PM

## 2020-09-07 ENCOUNTER — Inpatient Hospital Stay: Payer: BC Managed Care – PPO

## 2020-09-07 ENCOUNTER — Encounter: Payer: Self-pay | Admitting: Oncology

## 2020-09-07 ENCOUNTER — Inpatient Hospital Stay (HOSPITAL_BASED_OUTPATIENT_CLINIC_OR_DEPARTMENT_OTHER): Payer: BC Managed Care – PPO | Admitting: Oncology

## 2020-09-07 ENCOUNTER — Ambulatory Visit: Payer: BC Managed Care – PPO

## 2020-09-07 ENCOUNTER — Other Ambulatory Visit: Payer: Self-pay

## 2020-09-07 ENCOUNTER — Other Ambulatory Visit: Payer: BC Managed Care – PPO

## 2020-09-07 ENCOUNTER — Ambulatory Visit: Payer: BC Managed Care – PPO | Admitting: Oncology

## 2020-09-07 VITALS — BP 128/60 | HR 84 | Temp 98.2°F | Wt 159.3 lb

## 2020-09-07 DIAGNOSIS — C221 Intrahepatic bile duct carcinoma: Secondary | ICD-10-CM

## 2020-09-07 LAB — CBC WITH DIFFERENTIAL/PLATELET
Abs Immature Granulocytes: 0.06 10*3/uL (ref 0.00–0.07)
Basophils Absolute: 0.1 10*3/uL (ref 0.0–0.1)
Basophils Relative: 1 %
Eosinophils Absolute: 0.5 10*3/uL (ref 0.0–0.5)
Eosinophils Relative: 4 %
HCT: 33.7 % — ABNORMAL LOW (ref 36.0–46.0)
Hemoglobin: 10.6 g/dL — ABNORMAL LOW (ref 12.0–15.0)
Immature Granulocytes: 1 %
Lymphocytes Relative: 21 %
Lymphs Abs: 2.3 10*3/uL (ref 0.7–4.0)
MCH: 23.9 pg — ABNORMAL LOW (ref 26.0–34.0)
MCHC: 31.5 g/dL (ref 30.0–36.0)
MCV: 76.1 fL — ABNORMAL LOW (ref 80.0–100.0)
Monocytes Absolute: 0.7 10*3/uL (ref 0.1–1.0)
Monocytes Relative: 7 %
Neutro Abs: 7.1 10*3/uL (ref 1.7–7.7)
Neutrophils Relative %: 66 %
Platelets: 445 10*3/uL — ABNORMAL HIGH (ref 150–400)
RBC: 4.43 MIL/uL (ref 3.87–5.11)
RDW: 15.9 % — ABNORMAL HIGH (ref 11.5–15.5)
WBC: 10.7 10*3/uL — ABNORMAL HIGH (ref 4.0–10.5)
nRBC: 0 % (ref 0.0–0.2)

## 2020-09-07 LAB — COMPREHENSIVE METABOLIC PANEL
ALT: 59 U/L — ABNORMAL HIGH (ref 0–44)
AST: 32 U/L (ref 15–41)
Albumin: 3.2 g/dL — ABNORMAL LOW (ref 3.5–5.0)
Alkaline Phosphatase: 79 U/L (ref 38–126)
Anion gap: 10 (ref 5–15)
BUN: 10 mg/dL (ref 6–20)
CO2: 27 mmol/L (ref 22–32)
Calcium: 8.6 mg/dL — ABNORMAL LOW (ref 8.9–10.3)
Chloride: 95 mmol/L — ABNORMAL LOW (ref 98–111)
Creatinine, Ser: 0.58 mg/dL (ref 0.44–1.00)
GFR, Estimated: >60 mL/min (ref >60)
Glucose, Bld: 140 mg/dL — ABNORMAL HIGH (ref 70–99)
Potassium: 4.3 mmol/L (ref 3.5–5.1)
Sodium: 132 mmol/L — ABNORMAL LOW (ref 135–145)
Total Bilirubin: 0.5 mg/dL (ref 0.3–1.2)
Total Protein: 8.1 g/dL (ref 6.5–8.1)

## 2020-09-07 MED ORDER — SODIUM CHLORIDE 0.9 % IV SOLN
1000.0000 mg/m2 | Freq: Once | INTRAVENOUS | Status: AC
  Administered 2020-09-07: 11:00:00 1862 mg via INTRAVENOUS
  Filled 2020-09-07: qty 48.97

## 2020-09-07 MED ORDER — PROCHLORPERAZINE MALEATE 10 MG PO TABS
10.0000 mg | ORAL_TABLET | Freq: Once | ORAL | Status: AC
  Administered 2020-09-07: 10:00:00 10 mg via ORAL
  Filled 2020-09-07: qty 1

## 2020-09-07 MED ORDER — SODIUM CHLORIDE 0.9 % IV SOLN
Freq: Once | INTRAVENOUS | Status: AC
  Filled 2020-09-07: qty 250

## 2020-09-07 NOTE — Progress Notes (Signed)
Pt tolerated infusion well. Pt stable at discharge. 

## 2020-09-17 NOTE — Progress Notes (Signed)
[place]  Telephone:(336) [phone] Fax:(336) [phone]  ID: Lauren Lindsey OB: 1960/10/04  MR#: [id number]  [id number]  Patient Care Team: [name], MD as PCP - General (Family Medicine) Patient, No Pcp Per (General Practice) [name], RN as Oncology Nurse Navigator  CHIEF COMPLAINT: Stage IV intrahepatic cholangiocarcinoma.  INTERVAL HISTORY: Patient returns to clinic today for further evaluation and consideration of cycle 2, day 1 of cisplatin and gemcitabine.  She continues to have congestion and low-grade fevers.  Patient states she felt "great" all week and was able to work full-time, but this morning feels sluggish and has increased weakness and fatigue.  She continues to have intermittent flank pain.  She has no neurologic complaints. She has a fair appetite and denies weight loss.  She has no chest pain, shortness of breath, cough, or hemoptysis.  She denies any nausea, vomiting, constipation, or diarrhea.  She has no melena or hematochezia.  She has no urinary complaints.  Patient offers no further specific complaints today.  REVIEW OF SYSTEMS:   Review of Systems  Constitutional: Positive for malaise/fatigue. Negative for fever and weight loss.  HENT: Positive for congestion. Negative for sinus pain.   Respiratory: Negative.  Negative for cough, hemoptysis and shortness of breath.   Cardiovascular: Negative.  Negative for chest pain and leg swelling.  Gastrointestinal: Negative.  Negative for abdominal pain, blood in stool, constipation, diarrhea, melena, nausea and vomiting.  Genitourinary: Positive for flank pain. Negative for dysuria.  Musculoskeletal: Negative for back pain.  Skin: Negative.  Negative for rash.  Neurological: Positive for weakness. Negative for dizziness, focal weakness and headaches.  Psychiatric/Behavioral: The patient is nervous/anxious.     As per HPI. Otherwise, a complete review of systems is  negative.  PAST MEDICAL HISTORY: Past Medical History:  Diagnosis Date  . Abnormal Pap smear of cervix   . Anemia    h/o  . Diabetes mellitus without complication ([place])     PAST SURGICAL HISTORY: Past Surgical History:  Procedure Laterality Date  . COLONOSCOPY WITH PROPOFOL N/A 06/15/2020   Procedure: COLONOSCOPY WITH PROPOFOL;  Surgeon: Lin Landsman, MD;  Location: [facility];  Service: Endoscopy;  Laterality: N/A;  Diabetic - oral meds  . ENDOMETRIAL ABLATION    . HYSTEROSCOPY WITH D & C    . LEEP      FAMILY HISTORY: Family History  Problem Relation Age of Onset  . Bleeding Disorder Mother   . Biliary Cirrhosis Mother   . Liver cancer Mother   . Hypertension Mother   . Diabetes Father   . Lung cancer Father   . Breast cancer Neg Hx     ADVANCED DIRECTIVES (Y/N):  N  HEALTH MAINTENANCE: Social History   Tobacco Use  . Smoking status: Never Smoker  . Smokeless tobacco: Never Used  Vaping Use  . Vaping Use: Never used  Substance Use Topics  . Alcohol use: No  . Drug use: No     Colonoscopy:  PAP:  Bone density:  Lipid panel:  No Known Allergies  Current Outpatient Medications  Medication Sig Dispense Refill  . acetaminophen (TYLENOL) 500 MG tablet Take 500 mg by mouth every 6 (six) hours as needed.    . metFORMIN (GLUCOPHAGE) 500 MG tablet Take 500 mg by mouth 2 (two) times daily with a meal.     . ondansetron (ZOFRAN) 8 MG tablet Take 1 tablet (8 mg total) by mouth every 8 (eight) hours as needed  for nausea or vomiting. (Patient not taking: No sig reported) 30 tablet 2  . prochlorperazine (COMPAZINE) 10 MG tablet Take 1 tablet (10 mg total) by mouth every 6 (six) hours as needed for nausea or vomiting. (Patient not taking: No sig reported) 30 tablet 2   No current facility-administered medications for this visit.    OBJECTIVE: Vitals:   09/21/20 0856  BP: (!) 108/52  Pulse: 88  Resp: 18  Temp: 100 F (37.8 C)     Body mass index  is 26.91 kg/m.    ECOG FS:0 - Asymptomatic  General: Well-developed, well-nourished, no acute distress. Eyes: Pink conjunctiva, anicteric sclera. HEENT: Normocephalic, moist mucous membranes. Lungs: No audible wheezing or coughing. Heart: Regular rate and rhythm. Abdomen: Soft, nontender, no obvious distention. Musculoskeletal: No edema, cyanosis, or clubbing. Neuro: Alert, answering all questions appropriately. Cranial nerves grossly intact. Skin: No rashes or petechiae noted. Psych: Normal affect.   LAB RESULTS:  Lab Results  Component Value Date   NA 134 (L) 09/21/2020   K 3.6 09/21/2020   CL 100 09/21/2020   CO2 29 09/21/2020   GLUCOSE 161 (H) 09/21/2020   BUN 13 09/21/2020   CREATININE 0.59 09/21/2020   CALCIUM 8.1 (L) 09/21/2020   PROT 7.2 09/21/2020   ALBUMIN 2.9 (L) 09/21/2020   AST 183 (H) 09/21/2020   ALT 64 (H) 09/21/2020   ALKPHOS 59 09/21/2020   BILITOT 0.5 09/21/2020   GFRNONAA >60 09/21/2020   GFRAA >60 05/23/2020    Lab Results  Component Value Date   WBC 7.1 09/21/2020   NEUTROABS 5.4 09/21/2020   HGB 8.6 (L) 09/21/2020   HCT 27.4 (L) 09/21/2020   MCV 75.7 (L) 09/21/2020   PLT 895 (H) 09/21/2020     STUDIES: US Abdomen Complete  Result Date: 09/01/2020 CLINICAL DATA:  Right upper quadrant pain. History of cholecystectomy. History of peritoneal carcinomatosis. History of biopsy-proven carcinoma right lobe of liver. EXAM: ABDOMEN ULTRASOUND COMPLETE COMPARISON:  MRI liver 07/20/2020 FINDINGS: Gallbladder: Surgically absent Common bile duct: Diameter: 4 mm Liver: Hypoechoic mass in the anterior right lobe of the liver measuring 5.7 x 4.5 x 3.7 cm. Portal vein is patent on color Doppler imaging with normal direction of blood flow towards the liver. IVC: No abnormality visualized. Pancreas: Visualized portion unremarkable. Spleen: Size and appearance within normal limits. Right Kidney: Length: 13.3 cm. 2 cm right parapelvic cyst. Echogenicity within  normal limits. No mass or hydronephrosis visualized. Left Kidney: Length: 11.7 cm. Echogenicity within normal limits. No mass or hydronephrosis visualized. Abdominal aorta: No aneurysm visualized. Other findings: Negative for ascites. IMPRESSION: Mass lesion right lobe liver, known carcinoma. Postop cholecystectomy.  No biliary dilatation. No free fluid. Electronically Signed   By: Franchot Gallo M.D.   On: 09/01/2020 15:03    ASSESSMENT: Stage IV intrahepatic cholangiocarcinoma.  PLAN:    1.  Stage IV intrahepatic cholangiocarcinoma: Incidental finding on routine cholecystectomy. PET scan results from July 13, 2020 as well as MRI results from July 20, 2020 reviewed independently with right lobe liver lesion consistent with intrahepatic cholangiocarcinoma.  Biopsy confirmed the results.  Gynecologic work-up is essentially negative.  Colonoscopy on June 15, 2020 was essentially within normal limits.  Mammogram on July 19, 2020 was reported as BI-RADS 2. All patient tumor markers are negative.  Patient has declined port placement at this time.  Plan to give cisplatin and gemcitabine on day 1 with gemcitabine only on day 8.  This will be a 21-day cycle and will  reimage after four cycles.  Proceed with cycle 2, day 1 of treatment today.  Return to clinic in 1 week for further evaluation and consideration of cycle 2, day 8 which is gemcitabine only.  2.  Sinus congestion: Continue OTC treatments.  Patient completed a course of antibiotics. 3.  Diarrhea: Patient does not complain of this today. 4.  Leukocytosis: Resolved. 5.  Anemia: Hemoglobin trending down and is now 8.6, monitor. 6.  Thrombocytosis: Worse today.,  Monitor. 7.  Flank pain: Likely secondary to malignancy.  Abdominal ultrasound was unrevealing.  Continue current narcotics as scheduled. 8.  Elevated AST: Patient's AST significant increased to 183 with only mild elevation of her ALT.  Proceed with treatment as above.   Monitor.   Patient expressed understanding and was in agreement with this plan. She also understands that She can call clinic at any time with any questions, concerns, or complaints.   Cancer Staging Intrahepatic cholangiocarcinoma ([place]) Staging form: Intrahepatic Bile Duct, AJCC 8th Edition - Clinical stage from 08/04/2020: Stage IV (cT1b, cN0, pM1) - Signed by Lloyd Huger, MD on 08/04/2020   Lloyd Huger, MD   09/22/2020 7:34 AM

## 2020-09-21 ENCOUNTER — Inpatient Hospital Stay: Payer: BC Managed Care – PPO

## 2020-09-21 ENCOUNTER — Other Ambulatory Visit: Payer: Self-pay

## 2020-09-21 ENCOUNTER — Encounter: Payer: Self-pay | Admitting: Oncology

## 2020-09-21 ENCOUNTER — Inpatient Hospital Stay: Payer: BC Managed Care – PPO | Admitting: Oncology

## 2020-09-21 VITALS — BP 108/52 | HR 88 | Temp 100.0°F | Resp 18 | Wt 161.7 lb

## 2020-09-21 VITALS — BP 122/56 | HR 102 | Temp 98.7°F | Resp 20

## 2020-09-21 DIAGNOSIS — C221 Intrahepatic bile duct carcinoma: Secondary | ICD-10-CM

## 2020-09-21 LAB — CBC WITH DIFFERENTIAL/PLATELET
Abs Immature Granulocytes: 0.09 10*3/uL — ABNORMAL HIGH (ref 0.00–0.07)
Basophils Absolute: 0 10*3/uL (ref 0.0–0.1)
Basophils Relative: 0 %
Eosinophils Absolute: 0.1 10*3/uL (ref 0.0–0.5)
Eosinophils Relative: 1 %
HCT: 27.4 % — ABNORMAL LOW (ref 36.0–46.0)
Hemoglobin: 8.6 g/dL — ABNORMAL LOW (ref 12.0–15.0)
Immature Granulocytes: 1 %
Lymphocytes Relative: 9 %
Lymphs Abs: 0.6 10*3/uL — ABNORMAL LOW (ref 0.7–4.0)
MCH: 23.8 pg — ABNORMAL LOW (ref 26.0–34.0)
MCHC: 31.4 g/dL (ref 30.0–36.0)
MCV: 75.7 fL — ABNORMAL LOW (ref 80.0–100.0)
Monocytes Absolute: 0.9 10*3/uL (ref 0.1–1.0)
Monocytes Relative: 12 %
Neutro Abs: 5.4 10*3/uL (ref 1.7–7.7)
Neutrophils Relative %: 77 %
Platelets: 895 10*3/uL — ABNORMAL HIGH (ref 150–400)
RBC: 3.62 MIL/uL — ABNORMAL LOW (ref 3.87–5.11)
RDW: 17.8 % — ABNORMAL HIGH (ref 11.5–15.5)
WBC: 7.1 10*3/uL (ref 4.0–10.5)
nRBC: 0 % (ref 0.0–0.2)

## 2020-09-21 LAB — COMPREHENSIVE METABOLIC PANEL
ALT: 64 U/L — ABNORMAL HIGH (ref 0–44)
AST: 183 U/L — ABNORMAL HIGH (ref 15–41)
Albumin: 2.9 g/dL — ABNORMAL LOW (ref 3.5–5.0)
Alkaline Phosphatase: 59 U/L (ref 38–126)
Anion gap: 5 (ref 5–15)
BUN: 13 mg/dL (ref 6–20)
CO2: 29 mmol/L (ref 22–32)
Calcium: 8.1 mg/dL — ABNORMAL LOW (ref 8.9–10.3)
Chloride: 100 mmol/L (ref 98–111)
Creatinine, Ser: 0.59 mg/dL (ref 0.44–1.00)
GFR, Estimated: >60 mL/min (ref >60)
Glucose, Bld: 161 mg/dL — ABNORMAL HIGH (ref 70–99)
Potassium: 3.6 mmol/L (ref 3.5–5.1)
Sodium: 134 mmol/L — ABNORMAL LOW (ref 135–145)
Total Bilirubin: 0.5 mg/dL (ref 0.3–1.2)
Total Protein: 7.2 g/dL (ref 6.5–8.1)

## 2020-09-21 MED ORDER — SODIUM CHLORIDE 0.9 % IV SOLN
80.0000 mg/m2 | Freq: Once | INTRAVENOUS | Status: AC
  Administered 2020-09-21: 14:00:00 148 mg via INTRAVENOUS
  Filled 2020-09-21: qty 100

## 2020-09-21 MED ORDER — SODIUM CHLORIDE 0.9 % IV SOLN
Freq: Once | INTRAVENOUS | Status: AC
  Filled 2020-09-21: qty 250

## 2020-09-21 MED ORDER — POTASSIUM CHLORIDE IN NACL 20-0.9 MEQ/L-% IV SOLN
Freq: Once | INTRAVENOUS | Status: AC
  Filled 2020-09-21: qty 1000

## 2020-09-21 MED ORDER — SODIUM CHLORIDE 0.9 % IV SOLN
150.0000 mg | Freq: Once | INTRAVENOUS | Status: AC
  Administered 2020-09-21: 13:00:00 150 mg via INTRAVENOUS
  Filled 2020-09-21: qty 150

## 2020-09-21 MED ORDER — MAGNESIUM SULFATE 2 GM/50ML IV SOLN
2.0000 g | Freq: Once | INTRAVENOUS | Status: AC
  Administered 2020-09-21: 11:00:00 2 g via INTRAVENOUS
  Filled 2020-09-21: qty 50

## 2020-09-21 MED ORDER — PALONOSETRON HCL INJECTION 0.25 MG/5ML
0.2500 mg | Freq: Once | INTRAVENOUS | Status: AC
  Administered 2020-09-21: 13:00:00 0.25 mg via INTRAVENOUS
  Filled 2020-09-21: qty 5

## 2020-09-21 MED ORDER — SODIUM CHLORIDE 0.9 % IV SOLN
10.0000 mg | Freq: Once | INTRAVENOUS | Status: AC
  Administered 2020-09-21: 13:00:00 10 mg via INTRAVENOUS
  Filled 2020-09-21: qty 10

## 2020-09-21 MED ORDER — GEMCITABINE HCL CHEMO INJECTION 1 GM/26.3ML
1000.0000 mg/m2 | Freq: Once | INTRAVENOUS | Status: AC
  Administered 2020-09-21: 14:00:00 1862 mg via INTRAVENOUS
  Filled 2020-09-21: qty 48.97

## 2020-09-21 NOTE — Progress Notes (Signed)
Pt in for follow up, reports had a long day at work yesterday.  States pain "all over in bones this morning" took 2 extra strength tylenol at 7am.  Pt also reports still having sinus pressure and drainage.

## 2020-09-21 NOTE — Progress Notes (Signed)
Patient tolerated infusion well. Discharged home.  

## 2020-09-24 NOTE — Progress Notes (Signed)
Lauren Lindsey  [phone] [phone]  ID: Lauren Lindsey OB: 1961/03/03  MR#: [id number]  [id number]  Patient Care Team: [name], MD as PCP - General (Family Medicine) Patient, No Pcp Per (General Practice) [name], RN as Oncology Nurse Navigator  CHIEF COMPLAINT: Stage IV intrahepatic cholangiocarcinoma.  INTERVAL HISTORY: Patient returns to clinic today for further evaluation and consideration of cycle 2, day 8 of cisplatin and gemcitabine.  Gemcitabine only.  She has worsening weakness and fatigue, I have poor appetite, and increased right flank pain. She has no neurologic complaints. She has a fair appetite and denies weight loss.  She has no chest pain, shortness of breath, cough, or hemoptysis.  She denies any nausea, vomiting, constipation, or diarrhea.  She has no melena or hematochezia.  She has no urinary complaints.  Patient feels generally terrible, but offers no further specific complaints today.  REVIEW OF SYSTEMS:   Review of Systems  Constitutional: Positive for malaise/fatigue. Negative for fever and weight loss.  HENT: Positive for congestion. Negative for sinus pain.   Respiratory: Negative.  Negative for cough, hemoptysis and shortness of breath.   Cardiovascular: Negative.  Negative for chest pain and leg swelling.  Gastrointestinal: Negative.  Negative for abdominal pain, blood in stool, constipation, diarrhea, melena, nausea and vomiting.  Genitourinary: Positive for flank pain. Negative for dysuria.  Musculoskeletal: Negative for back pain.  Skin: Negative.  Negative for rash.  Neurological: Positive for weakness. Negative for dizziness, focal weakness and headaches.  Psychiatric/Behavioral: The patient is nervous/anxious.     As per HPI. Otherwise, a complete review of systems is negative.  PAST MEDICAL HISTORY: Past Medical History:  Diagnosis Date  . Abnormal Pap smear of cervix   . Anemia     h/o  . Diabetes mellitus without complication ([place])     PAST SURGICAL HISTORY: Past Surgical History:  Procedure Laterality Date  . COLONOSCOPY WITH PROPOFOL N/A 06/15/2020   Procedure: COLONOSCOPY WITH PROPOFOL;  Surgeon: Lin Landsman, MD;  Location: [facility];  Service: Endoscopy;  Laterality: N/A;  Diabetic - oral meds  . ENDOMETRIAL ABLATION    . HYSTEROSCOPY WITH D & C    . LEEP      FAMILY HISTORY: Family History  Problem Relation Age of Onset  . Bleeding Disorder Mother   . Biliary Cirrhosis Mother   . Liver cancer Mother   . Hypertension Mother   . Diabetes Father   . Lung cancer Father   . Breast cancer Neg Hx     ADVANCED DIRECTIVES (Y/N):  N  HEALTH MAINTENANCE: Social History   Tobacco Use  . Smoking status: Never Smoker  . Smokeless tobacco: Never Used  Vaping Use  . Vaping Use: Never used  Substance Use Topics  . Alcohol use: No  . Drug use: No     Colonoscopy:  PAP:  Bone density:  Lipid panel:  No Known Allergies  No current facility-administered medications for this visit.   No current outpatient medications on file.   Facility-Administered Medications Ordered in Other Visits  Medication Dose Route Frequency Provider Last Rate Last Admin  . acetaminophen (TYLENOL) tablet 650 mg  650 mg Oral Q6H PRN Reubin Milan, MD       Or  . acetaminophen (TYLENOL) suppository 650 mg  650 mg Rectal Q6H PRN Reubin Milan, MD      . docusate sodium (COLACE) capsule 100 mg  100 mg Oral Daily  Alberteen Sam, MD      . enoxaparin (LOVENOX) injection 30 mg  30 mg Subcutaneous Q24H Bobette Mo, MD   30 mg at 09/29/20 0900  . ferrous sulfate tablet 325 mg  325 mg Oral BID WC Danford, Earl Lites, MD      . HYDROcodone-acetaminophen (NORCO/VICODIN) 5-325 MG per tablet 1 tablet  1 tablet Oral Q4H PRN Bobette Mo, MD      . insulin aspart (novoLOG) injection 0-5 Units  0-5 Units Subcutaneous QHS Danford,  Earl Lites, MD      . insulin aspart (novoLOG) injection 0-6 Units  0-6 Units Subcutaneous BID AC Danford, Earl Lites, MD      . lactated ringers infusion   Intravenous Continuous Bobette Mo, MD 125 mL/hr at 09/30/20 0622 Infusion Verify at 09/30/20 0622  . ondansetron (ZOFRAN) tablet 4 mg  4 mg Oral Q6H PRN Bobette Mo, MD       Or  . ondansetron [facility]) injection 4 mg  4 mg Intravenous Q6H PRN Bobette Mo, MD      . prochlorperazine (COMPAZINE) tablet 10 mg  10 mg Oral Q6H PRN Bobette Mo, MD        OBJECTIVE: Vitals:   09/28/20 0916  BP: 136/71  Pulse: 75  Resp: 20  Temp: 97.9 F (36.6 C)     Body mass index is 26.41 kg/m.    ECOG FS:2 - Symptomatic, <50% confined to bed  General: Well-developed, well-nourished, no acute distress. Eyes: Pink conjunctiva, anicteric sclera. HEENT: Normocephalic, moist mucous membranes. Lungs: No audible wheezing or coughing. Heart: Regular rate and rhythm. Abdomen: Soft, nontender, no obvious distention. Musculoskeletal: No edema, cyanosis, or clubbing. Neuro: Alert, answering all questions appropriately. Cranial nerves grossly intact. Skin: No rashes or petechiae noted. Psych: Normal affect.   LAB RESULTS:  Lab Results  Component Value Date   NA 140 09/29/2020   K 3.8 09/29/2020   CL 106 09/29/2020   CO2 23 09/29/2020   GLUCOSE 128 (H) 09/29/2020   BUN 59 (H) 09/29/2020   CREATININE 5.43 (H) 09/29/2020   CALCIUM 8.0 (L) 09/29/2020   PROT 6.6 09/29/2020   ALBUMIN 2.3 (L) 09/29/2020   AST 33 09/29/2020   ALT 31 09/29/2020   ALKPHOS 76 09/29/2020   BILITOT 0.7 09/29/2020   GFRNONAA 9 (L) 09/29/2020   GFRAA >60 05/23/2020    Lab Results  Component Value Date   WBC 5.4 09/30/2020   NEUTROABS 3.0 09/29/2020   HGB 9.2 (L) 09/30/2020   HCT 28.7 (L) 09/30/2020   MCV 74.5 (L) 09/30/2020   PLT 199 09/30/2020     STUDIES: US Abdomen Complete  Result Date: 09/01/2020 CLINICAL DATA:   Right upper quadrant pain. History of cholecystectomy. History of peritoneal carcinomatosis. History of biopsy-proven carcinoma right lobe of liver. EXAM: ABDOMEN ULTRASOUND COMPLETE COMPARISON:  MRI liver 07/20/2020 FINDINGS: Gallbladder: Surgically absent Common bile duct: Diameter: 4 mm Liver: Hypoechoic mass in the anterior right lobe of the liver measuring 5.7 x 4.5 x 3.7 cm. Portal vein is patent on color Doppler imaging with normal direction of blood flow towards the liver. IVC: No abnormality visualized. Pancreas: Visualized portion unremarkable. Spleen: Size and appearance within normal limits. Right Kidney: Length: 13.3 cm. 2 cm right parapelvic cyst. Echogenicity within normal limits. No mass or hydronephrosis visualized. Left Kidney: Length: 11.7 cm. Echogenicity within normal limits. No mass or hydronephrosis visualized. Abdominal aorta: No aneurysm visualized. Other findings: Negative for ascites. IMPRESSION:  Mass lesion right lobe liver, known carcinoma. Postop cholecystectomy.  No biliary dilatation. No free fluid. Electronically Signed   By: Franchot Gallo M.D.   On: 09/01/2020 15:03   US Renal  Result Date: 09/29/2020 CLINICAL DATA:  Acute renal insufficiency EXAM: RENAL / URINARY TRACT ULTRASOUND COMPLETE COMPARISON:  09/01/2020, CT 06/16/2020 FINDINGS: Right Kidney: Renal measurements: 14.4 x 6.0 x 6.2 cm = volume: 277 mL. Renal cortical thickness and cortical echogenicity are normal. No intrarenal masses or calcifications are seen. There is mild distension of the renal pelvis without associated caliectasis. Left Kidney: Renal measurements: 13.2 x 7.4 x 6.9 cm = volume: 351 mL. Renal cortical thickness and cortical echogenicity are normal. No intrarenal masses or calcifications are seen. There is mild distension of the renal pelvis without associated caliectasis. Bladder: The bladder is mildly distended. No intraluminal masses or calcifications are seen. Bilateral ureteral jets are identified.  Other: None. IMPRESSION: Mild distention of the renal pelves bilaterally possibly related to urinary retention. No associated caliectasis. Otherwise normal renal sonogram. Electronically Signed   By: Fidela Salisbury MD   On: 09/29/2020 01:46    ASSESSMENT: Stage IV intrahepatic cholangiocarcinoma.  PLAN:    1.  Stage IV intrahepatic cholangiocarcinoma: Incidental finding on routine cholecystectomy. PET scan results from July 13, 2020 as well as MRI results from July 20, 2020 reviewed independently with right lobe liver lesion consistent with intrahepatic cholangiocarcinoma.  Biopsy confirmed the results.  Gynecologic work-up is essentially negative.  Colonoscopy on June 15, 2020 was essentially within normal limits.  Mammogram on July 19, 2020 was reported as BI-RADS 2. All patient tumor markers are negative.  Patient has declined port placement at this time.  Plan to give cisplatin and gemcitabine on day 1 with gemcitabine only on day 8.  This will be a 21-day cycle and will reimage after four cycles.  Given patient's acute renal failure, we will delay cycle 2, day 8 of treatment patient was sent to the emergency room for admission.  We will arrange follow-up upon discharge. 2.  Sinus congestion: Continue OTC treatments.  Patient completed a course of antibiotics. 3.  Diarrhea: Patient does not complain of this today. 4.  Leukocytosis: Resolved. 5.  Anemia: Chronic and unchanged.  Patient's hemoglobin is 8.5. 6.  Thrombocytosis: Improved. 7.  Flank pain: Likely secondary to malignancy.  Abdominal ultrasound was unrevealing.  Worse today.  Patient received 2 mg IV morphine while in clinic. 8.  Elevated AST: Resolved. 8.  Acute renal failure: Unclear etiology.  Spite receiving greater than 2 L of IV fluids while in clinic, patient's creatinine did not improve and she was sent to the emergency room for admission.  Can consider CT of the abdomen to ensure this is not obstruction secondary to  progressive disease.  Patient expressed understanding and was in agreement with this plan. She also understands that She can call clinic at any time with any questions, concerns, or complaints.   Cancer Staging Intrahepatic cholangiocarcinoma ([place]) Staging form: Intrahepatic Bile Duct, AJCC 8th Edition - Clinical stage from 08/04/2020: Stage IV (cT1b, cN0, pM1) - Signed by Lloyd Huger, MD on 08/04/2020   Lloyd Huger, MD   09/30/2020 7:32 AM

## 2020-09-26 ENCOUNTER — Other Ambulatory Visit: Payer: Self-pay | Admitting: *Deleted

## 2020-09-26 ENCOUNTER — Other Ambulatory Visit: Payer: Self-pay | Admitting: Oncology

## 2020-09-26 ENCOUNTER — Telehealth: Payer: Self-pay | Admitting: *Deleted

## 2020-09-26 MED ORDER — HYDROCODONE-ACETAMINOPHEN 5-325 MG PO TABS: 1.0000 | ORAL_TABLET | ORAL | 0 refills | Status: DC | PRN

## 2020-09-26 NOTE — Telephone Encounter (Signed)
I thought she had something stronger.  Please call in some Norco, 60 tabs.  Thanks!

## 2020-09-26 NOTE — Telephone Encounter (Signed)
What is she taking?

## 2020-09-26 NOTE — Telephone Encounter (Signed)
I called and spoke with patient and husband, all she is taking now is Tylenol.

## 2020-09-26 NOTE — Telephone Encounter (Signed)
Husband called reporting that patient is having back pain and is asking that something be ordered for it. Please advise

## 2020-09-28 ENCOUNTER — Encounter: Payer: Self-pay | Admitting: Oncology

## 2020-09-28 ENCOUNTER — Encounter (INDEPENDENT_AMBULATORY_CARE_PROVIDER_SITE_OTHER): Payer: Self-pay

## 2020-09-28 ENCOUNTER — Other Ambulatory Visit: Payer: Self-pay

## 2020-09-28 ENCOUNTER — Inpatient Hospital Stay: Payer: BC Managed Care – PPO | Attending: Oncology

## 2020-09-28 ENCOUNTER — Inpatient Hospital Stay: Payer: BC Managed Care – PPO

## 2020-09-28 ENCOUNTER — Inpatient Hospital Stay: Payer: BC Managed Care – PPO | Admitting: Oncology

## 2020-09-28 VITALS — BP 136/71 | HR 75 | Temp 97.9°F | Resp 20 | Wt 158.7 lb

## 2020-09-28 VITALS — BP 152/73 | HR 71 | Temp 96.7°F | Resp 20

## 2020-09-28 DIAGNOSIS — T451X5A Adverse effect of antineoplastic and immunosuppressive drugs, initial encounter: Secondary | ICD-10-CM | POA: Diagnosis present

## 2020-09-28 DIAGNOSIS — N170 Acute kidney failure with tubular necrosis: Secondary | ICD-10-CM | POA: Diagnosis not present

## 2020-09-28 DIAGNOSIS — Z800 Family history of malignant neoplasm of digestive organs: Secondary | ICD-10-CM

## 2020-09-28 DIAGNOSIS — R7989 Other specified abnormal findings of blood chemistry: Secondary | ICD-10-CM

## 2020-09-28 DIAGNOSIS — C801 Malignant (primary) neoplasm, unspecified: Secondary | ICD-10-CM | POA: Diagnosis not present

## 2020-09-28 DIAGNOSIS — D6481 Anemia due to antineoplastic chemotherapy: Secondary | ICD-10-CM | POA: Diagnosis present

## 2020-09-28 DIAGNOSIS — Z7984 Long term (current) use of oral hypoglycemic drugs: Secondary | ICD-10-CM

## 2020-09-28 DIAGNOSIS — C221 Intrahepatic bile duct carcinoma: Secondary | ICD-10-CM | POA: Diagnosis present

## 2020-09-28 DIAGNOSIS — E860 Dehydration: Secondary | ICD-10-CM | POA: Diagnosis present

## 2020-09-28 DIAGNOSIS — Z832 Family history of diseases of the blood and blood-forming organs and certain disorders involving the immune mechanism: Secondary | ICD-10-CM

## 2020-09-28 DIAGNOSIS — Z833 Family history of diabetes mellitus: Secondary | ICD-10-CM

## 2020-09-28 DIAGNOSIS — D638 Anemia in other chronic diseases classified elsewhere: Secondary | ICD-10-CM | POA: Diagnosis present

## 2020-09-28 DIAGNOSIS — Z801 Family history of malignant neoplasm of trachea, bronchus and lung: Secondary | ICD-10-CM

## 2020-09-28 DIAGNOSIS — U071 COVID-19: Secondary | ICD-10-CM | POA: Diagnosis present

## 2020-09-28 DIAGNOSIS — E119 Type 2 diabetes mellitus without complications: Secondary | ICD-10-CM | POA: Diagnosis present

## 2020-09-28 DIAGNOSIS — D509 Iron deficiency anemia, unspecified: Secondary | ICD-10-CM | POA: Diagnosis present

## 2020-09-28 DIAGNOSIS — Z79899 Other long term (current) drug therapy: Secondary | ICD-10-CM

## 2020-09-28 DIAGNOSIS — E876 Hypokalemia: Secondary | ICD-10-CM | POA: Diagnosis not present

## 2020-09-28 DIAGNOSIS — Z8249 Family history of ischemic heart disease and other diseases of the circulatory system: Secondary | ICD-10-CM

## 2020-09-28 DIAGNOSIS — C799 Secondary malignant neoplasm of unspecified site: Secondary | ICD-10-CM | POA: Diagnosis present

## 2020-09-28 LAB — BASIC METABOLIC PANEL
Anion gap: 15 (ref 5–15)
BUN: 59 mg/dL — ABNORMAL HIGH (ref 6–20)
CO2: 20 mmol/L — ABNORMAL LOW (ref 22–32)
Calcium: 7.7 mg/dL — ABNORMAL LOW (ref 8.9–10.3)
Chloride: 101 mmol/L (ref 98–111)
Creatinine, Ser: 5.11 mg/dL — ABNORMAL HIGH (ref 0.44–1.00)
GFR, Estimated: 9 mL/min — ABNORMAL LOW (ref >60)
Glucose, Bld: 89 mg/dL (ref 70–99)
Potassium: 3.9 mmol/L (ref 3.5–5.1)
Sodium: 136 mmol/L (ref 135–145)

## 2020-09-28 LAB — CBC WITH DIFFERENTIAL/PLATELET
Abs Immature Granulocytes: 0.02 10*3/uL (ref 0.00–0.07)
Basophils Absolute: 0 10*3/uL (ref 0.0–0.1)
Basophils Relative: 0 %
Eosinophils Absolute: 0 10*3/uL (ref 0.0–0.5)
Eosinophils Relative: 1 %
HCT: 29.5 % — ABNORMAL LOW (ref 36.0–46.0)
Hemoglobin: 9.5 g/dL — ABNORMAL LOW (ref 12.0–15.0)
Immature Granulocytes: 1 %
Lymphocytes Relative: 18 %
Lymphs Abs: 0.8 10*3/uL (ref 0.7–4.0)
MCH: 23.6 pg — ABNORMAL LOW (ref 26.0–34.0)
MCHC: 32.2 g/dL (ref 30.0–36.0)
MCV: 73.2 fL — ABNORMAL LOW (ref 80.0–100.0)
Monocytes Absolute: 0.5 10*3/uL (ref 0.1–1.0)
Monocytes Relative: 13 %
Neutro Abs: 2.8 10*3/uL (ref 1.7–7.7)
Neutrophils Relative %: 67 %
Platelets: 336 10*3/uL (ref 150–400)
RBC: 4.03 MIL/uL (ref 3.87–5.11)
RDW: 17 % — ABNORMAL HIGH (ref 11.5–15.5)
WBC: 4.2 10*3/uL (ref 4.0–10.5)
nRBC: 0 % (ref 0.0–0.2)

## 2020-09-28 LAB — COMPREHENSIVE METABOLIC PANEL
ALT: 33 U/L (ref 0–44)
AST: 32 U/L (ref 15–41)
Albumin: 2.9 g/dL — ABNORMAL LOW (ref 3.5–5.0)
Alkaline Phosphatase: 72 U/L (ref 38–126)
Anion gap: 13 (ref 5–15)
BUN: 62 mg/dL — ABNORMAL HIGH (ref 6–20)
CO2: 24 mmol/L (ref 22–32)
Calcium: 8.1 mg/dL — ABNORMAL LOW (ref 8.9–10.3)
Chloride: 96 mmol/L — ABNORMAL LOW (ref 98–111)
Creatinine, Ser: 5.37 mg/dL — ABNORMAL HIGH (ref 0.44–1.00)
GFR, Estimated: 9 mL/min — ABNORMAL LOW (ref >60)
Glucose, Bld: 177 mg/dL — ABNORMAL HIGH (ref 70–99)
Potassium: 3.5 mmol/L (ref 3.5–5.1)
Sodium: 133 mmol/L — ABNORMAL LOW (ref 135–145)
Total Bilirubin: 0.5 mg/dL (ref 0.3–1.2)
Total Protein: 7.4 g/dL (ref 6.5–8.1)

## 2020-09-28 MED ORDER — HEPARIN SOD (PORK) LOCK FLUSH 100 UNIT/ML IV SOLN
INTRAVENOUS | Status: AC
  Filled 2020-09-28: qty 5

## 2020-09-28 MED ORDER — SODIUM CHLORIDE 0.9 % IV SOLN
Freq: Once | INTRAVENOUS | Status: AC
  Filled 2020-09-28: qty 250

## 2020-09-28 MED ORDER — SODIUM CHLORIDE 0.9 % IV SOLN
Freq: Once | INTRAVENOUS | Status: AC
  Administered 2020-09-28: 1000 mL via INTRAVENOUS
  Filled 2020-09-28: qty 250

## 2020-09-28 MED ORDER — MORPHINE SULFATE (PF) 2 MG/ML IV SOLN
2.0000 mg | Freq: Once | INTRAVENOUS | Status: AC
  Administered 2020-09-28: 2 mg via INTRAVENOUS
  Filled 2020-09-28: qty 1

## 2020-09-28 MED ORDER — SODIUM CHLORIDE 0.9 % IV SOLN
INTRAVENOUS | Status: DC
  Filled 2020-09-28 (×2): qty 250

## 2020-09-28 NOTE — Progress Notes (Unsigned)
Patient here today for follow up, treatment consideration. Patient reports ongoing abdominal, side and lower back pain. Patient reports no bowel movement in 1 week, only passing gas. Patient also reports she feels full all the time, even when she is only taking sips of ensure. She reports decreased appetite.

## 2020-09-28 NOTE — ED Triage Notes (Signed)
Pt states sent from cancer center to be observed for "kidney problem", when asked questions, pt states it is all in her chart.  Reports bile duct cancer  Pt appears lethargic in triage   Discussed pt with DR goodman

## 2020-09-28 NOTE — Progress Notes (Signed)
Patient transported to ED via wheelchair by Lysle Dingwall RN for elevated creatinine. Total of 2355 ml NS given while at [facility] and 2mg  Morphine IV given. Patient had at least of urine output.  IV site saline locked. SIte patent, clean, dry and intact

## 2020-09-29 ENCOUNTER — Observation Stay: Payer: BC Managed Care – PPO

## 2020-09-29 ENCOUNTER — Inpatient Hospital Stay
Admission: EM | Admit: 2020-09-29 | Discharge: 2020-10-02 | DRG: 682 | Disposition: A | Discharge status: Home / Self Care | Payer: BC Managed Care – PPO | Attending: Internal Medicine | Admitting: Internal Medicine

## 2020-09-29 DIAGNOSIS — D638 Anemia in other chronic diseases classified elsewhere: Secondary | ICD-10-CM | POA: Diagnosis present

## 2020-09-29 DIAGNOSIS — N179 Acute kidney failure, unspecified: Secondary | ICD-10-CM | POA: Diagnosis present

## 2020-09-29 DIAGNOSIS — T451X5A Adverse effect of antineoplastic and immunosuppressive drugs, initial encounter: Secondary | ICD-10-CM | POA: Diagnosis present

## 2020-09-29 DIAGNOSIS — C801 Malignant (primary) neoplasm, unspecified: Secondary | ICD-10-CM

## 2020-09-29 DIAGNOSIS — E119 Type 2 diabetes mellitus without complications: Secondary | ICD-10-CM | POA: Diagnosis present

## 2020-09-29 DIAGNOSIS — Z801 Family history of malignant neoplasm of trachea, bronchus and lung: Secondary | ICD-10-CM | POA: Diagnosis not present

## 2020-09-29 DIAGNOSIS — D509 Iron deficiency anemia, unspecified: Secondary | ICD-10-CM | POA: Diagnosis present

## 2020-09-29 DIAGNOSIS — Z833 Family history of diabetes mellitus: Secondary | ICD-10-CM | POA: Diagnosis not present

## 2020-09-29 DIAGNOSIS — Z7984 Long term (current) use of oral hypoglycemic drugs: Secondary | ICD-10-CM | POA: Diagnosis not present

## 2020-09-29 DIAGNOSIS — Z800 Family history of malignant neoplasm of digestive organs: Secondary | ICD-10-CM | POA: Diagnosis not present

## 2020-09-29 DIAGNOSIS — Z79899 Other long term (current) drug therapy: Secondary | ICD-10-CM | POA: Diagnosis not present

## 2020-09-29 DIAGNOSIS — E876 Hypokalemia: Secondary | ICD-10-CM | POA: Diagnosis not present

## 2020-09-29 DIAGNOSIS — D649 Anemia, unspecified: Secondary | ICD-10-CM | POA: Insufficient documentation

## 2020-09-29 DIAGNOSIS — C799 Secondary malignant neoplasm of unspecified site: Secondary | ICD-10-CM | POA: Diagnosis present

## 2020-09-29 DIAGNOSIS — D6481 Anemia due to antineoplastic chemotherapy: Secondary | ICD-10-CM | POA: Diagnosis present

## 2020-09-29 DIAGNOSIS — U071 COVID-19: Secondary | ICD-10-CM | POA: Diagnosis present

## 2020-09-29 DIAGNOSIS — Z8249 Family history of ischemic heart disease and other diseases of the circulatory system: Secondary | ICD-10-CM | POA: Diagnosis not present

## 2020-09-29 DIAGNOSIS — C221 Intrahepatic bile duct carcinoma: Secondary | ICD-10-CM | POA: Diagnosis present

## 2020-09-29 DIAGNOSIS — N170 Acute kidney failure with tubular necrosis: Secondary | ICD-10-CM | POA: Diagnosis present

## 2020-09-29 DIAGNOSIS — E860 Dehydration: Secondary | ICD-10-CM | POA: Diagnosis present

## 2020-09-29 DIAGNOSIS — Z832 Family history of diseases of the blood and blood-forming organs and certain disorders involving the immune mechanism: Secondary | ICD-10-CM | POA: Diagnosis not present

## 2020-09-29 LAB — COMPREHENSIVE METABOLIC PANEL
ALT: 31 U/L (ref 0–44)
AST: 33 U/L (ref 15–41)
Albumin: 2.6 g/dL — ABNORMAL LOW (ref 3.5–5.0)
Alkaline Phosphatase: 76 U/L (ref 38–126)
Anion gap: 12 (ref 5–15)
BUN: 58 mg/dL — ABNORMAL HIGH (ref 6–20)
CO2: 22 mmol/L (ref 22–32)
Calcium: 7.9 mg/dL — ABNORMAL LOW (ref 8.9–10.3)
Chloride: 104 mmol/L (ref 98–111)
Creatinine, Ser: 5.53 mg/dL — ABNORMAL HIGH (ref 0.44–1.00)
GFR, Estimated: 8 mL/min — ABNORMAL LOW (ref >60)
Glucose, Bld: 93 mg/dL (ref 70–99)
Potassium: 3.9 mmol/L (ref 3.5–5.1)
Sodium: 138 mmol/L (ref 135–145)
Total Bilirubin: 0.7 mg/dL (ref 0.3–1.2)
Total Protein: 6.6 g/dL (ref 6.5–8.1)

## 2020-09-29 LAB — URINALYSIS, COMPLETE (UACMP) WITH MICROSCOPIC
Bilirubin Urine: NEGATIVE
Glucose, UA: NEGATIVE mg/dL
Ketones, ur: NEGATIVE mg/dL
Leukocytes,Ua: NEGATIVE
Nitrite: NEGATIVE
Protein, ur: 30 mg/dL — AB
Specific Gravity, Urine: 1.003 — ABNORMAL LOW (ref 1.005–1.030)
pH: 6 (ref 5.0–8.0)

## 2020-09-29 LAB — RENAL FUNCTION PANEL
Albumin: 2.3 g/dL — ABNORMAL LOW (ref 3.5–5.0)
Anion gap: 11 (ref 5–15)
BUN: 59 mg/dL — ABNORMAL HIGH (ref 6–20)
CO2: 23 mmol/L (ref 22–32)
Calcium: 8 mg/dL — ABNORMAL LOW (ref 8.9–10.3)
Chloride: 106 mmol/L (ref 98–111)
Creatinine, Ser: 5.43 mg/dL — ABNORMAL HIGH (ref 0.44–1.00)
GFR, Estimated: 9 mL/min — ABNORMAL LOW (ref >60)
Glucose, Bld: 128 mg/dL — ABNORMAL HIGH (ref 70–99)
Phosphorus: 4.5 mg/dL (ref 2.5–4.6)
Potassium: 3.8 mmol/L (ref 3.5–5.1)
Sodium: 140 mmol/L (ref 135–145)

## 2020-09-29 LAB — CBC WITH DIFFERENTIAL/PLATELET
Abs Immature Granulocytes: 0.01 10*3/uL (ref 0.00–0.07)
Basophils Absolute: 0 10*3/uL (ref 0.0–0.1)
Basophils Relative: 0 %
Eosinophils Absolute: 0 10*3/uL (ref 0.0–0.5)
Eosinophils Relative: 1 %
HCT: 28.6 % — ABNORMAL LOW (ref 36.0–46.0)
Hemoglobin: 8.8 g/dL — ABNORMAL LOW (ref 12.0–15.0)
Immature Granulocytes: 0 %
Lymphocytes Relative: 24 %
Lymphs Abs: 1.1 10*3/uL (ref 0.7–4.0)
MCH: 23.5 pg — ABNORMAL LOW (ref 26.0–34.0)
MCHC: 30.8 g/dL (ref 30.0–36.0)
MCV: 76.5 fL — ABNORMAL LOW (ref 80.0–100.0)
Monocytes Absolute: 0.7 10*3/uL (ref 0.1–1.0)
Monocytes Relative: 14 %
Neutro Abs: 3 10*3/uL (ref 1.7–7.7)
Neutrophils Relative %: 61 %
Platelets: 249 10*3/uL (ref 150–400)
RBC: 3.74 MIL/uL — ABNORMAL LOW (ref 3.87–5.11)
RDW: 17.2 % — ABNORMAL HIGH (ref 11.5–15.5)
WBC: 4.8 10*3/uL (ref 4.0–10.5)
nRBC: 0 % (ref 0.0–0.2)

## 2020-09-29 LAB — IRON AND TIBC
Iron: 15 ug/dL — ABNORMAL LOW (ref 28–170)
Saturation Ratios: 9 % — ABNORMAL LOW (ref 10.4–31.8)
TIBC: 172 ug/dL — ABNORMAL LOW (ref 250–450)
UIBC: 157 ug/dL

## 2020-09-29 LAB — FERRITIN: Ferritin: 796 ng/mL — ABNORMAL HIGH (ref 11–307)

## 2020-09-29 LAB — RETICULOCYTES
Immature Retic Fract: 0 % — ABNORMAL LOW (ref 2.3–15.9)
RBC.: 3.74 MIL/uL — ABNORMAL LOW (ref 3.87–5.11)
Retic Count, Absolute: 6.7 10*3/uL — ABNORMAL LOW (ref 19.0–186.0)
Retic Ct Pct: <0.4 % — ABNORMAL LOW (ref 0.4–3.1)

## 2020-09-29 LAB — CBC
HCT: 26.4 % — ABNORMAL LOW (ref 36.0–46.0)
Hemoglobin: 8.5 g/dL — ABNORMAL LOW (ref 12.0–15.0)
MCH: 23.9 pg — ABNORMAL LOW (ref 26.0–34.0)
MCHC: 32.2 g/dL (ref 30.0–36.0)
MCV: 74.2 fL — ABNORMAL LOW (ref 80.0–100.0)
Platelets: 224 10*3/uL (ref 150–400)
RBC: 3.56 MIL/uL — ABNORMAL LOW (ref 3.87–5.11)
RDW: 17.3 % — ABNORMAL HIGH (ref 11.5–15.5)
WBC: 3.8 10*3/uL — ABNORMAL LOW (ref 4.0–10.5)
nRBC: 0 % (ref 0.0–0.2)

## 2020-09-29 LAB — CBG MONITORING, ED: Glucose-Capillary: 104 mg/dL — ABNORMAL HIGH (ref 70–99)

## 2020-09-29 LAB — VITAMIN B12: Vitamin B-12: 272 pg/mL (ref 180–914)

## 2020-09-29 LAB — HIV ANTIBODY (ROUTINE TESTING W REFLEX): HIV Screen 4th Generation wRfx: NONREACTIVE

## 2020-09-29 LAB — FOLATE: Folate: 13.3 ng/mL (ref >5.9)

## 2020-09-29 MED ORDER — LACTATED RINGERS IV BOLUS
1000.0000 mL | Freq: Once | INTRAVENOUS | Status: AC
  Administered 2020-09-29: 1000 mL via INTRAVENOUS

## 2020-09-29 MED ORDER — ONDANSETRON HCL 4 MG PO TABS: 4.0000 mg | ORAL_TABLET | Freq: Four times a day (QID) | ORAL | Status: DC | PRN

## 2020-09-29 MED ORDER — ACETAMINOPHEN 650 MG RE SUPP: 650.0000 mg | Freq: Four times a day (QID) | RECTAL | Status: DC | PRN

## 2020-09-29 MED ORDER — FERROUS SULFATE 325 (65 FE) MG PO TABS
325.0000 mg | ORAL_TABLET | Freq: Two times a day (BID) | ORAL | Status: DC
  Administered 2020-09-30 – 2020-10-02 (×5): 325 mg via ORAL
  Filled 2020-09-29 (×6): qty 1

## 2020-09-29 MED ORDER — DOCUSATE SODIUM 100 MG PO CAPS
100.0000 mg | ORAL_CAPSULE | Freq: Every day | ORAL | Status: DC
  Administered 2020-09-30 – 2020-10-02 (×3): 100 mg via ORAL
  Filled 2020-09-29 (×3): qty 1

## 2020-09-29 MED ORDER — HYDROCODONE-ACETAMINOPHEN 5-325 MG PO TABS: 1.0000 | ORAL_TABLET | ORAL | Status: DC | PRN

## 2020-09-29 MED ORDER — LACTATED RINGERS IV SOLN: INTRAVENOUS | Status: DC

## 2020-09-29 MED ORDER — ONDANSETRON HCL 4 MG/2ML IJ SOLN: 4.0000 mg | Freq: Four times a day (QID) | INTRAMUSCULAR | Status: DC | PRN

## 2020-09-29 MED ORDER — PROCHLORPERAZINE MALEATE 10 MG PO TABS
10.0000 mg | ORAL_TABLET | Freq: Four times a day (QID) | ORAL | Status: DC | PRN
  Filled 2020-09-29: qty 1

## 2020-09-29 MED ORDER — ACETAMINOPHEN 325 MG PO TABS: 650.0000 mg | ORAL_TABLET | Freq: Four times a day (QID) | ORAL | Status: DC | PRN

## 2020-09-29 MED ORDER — ENOXAPARIN SODIUM 30 MG/0.3ML ~~LOC~~ SOLN
30.0000 mg | SUBCUTANEOUS | Status: DC
  Administered 2020-09-29 – 2020-10-02 (×4): 30 mg via SUBCUTANEOUS
  Filled 2020-09-29 (×4): qty 0.3

## 2020-09-29 MED ORDER — INSULIN ASPART 100 UNIT/ML ~~LOC~~ SOLN: 0.0000 [IU] | Freq: Every day | SUBCUTANEOUS | Status: DC

## 2020-09-29 MED ORDER — INSULIN ASPART 100 UNIT/ML ~~LOC~~ SOLN: 0.0000 [IU] | Freq: Two times a day (BID) | SUBCUTANEOUS | Status: DC

## 2020-09-29 NOTE — ED Notes (Signed)
RN got up and walked to the bathroom without assistance. Pt steady on feet. Pt on cardiac, bp and pulse ox monitor.

## 2020-09-29 NOTE — ED Provider Notes (Signed)
[facility] Emergency Department Provider Note  ____________________________________________   Event Date/Time   First MD Initiated Contact with Patient 09/29/20 0016     (approximate)  I have reviewed the triage vital signs and the nursing notes.   HISTORY  Chief Complaint kidney problem   HPI CANDISS GALEANA is a 60 y.o. female with a past medical history of stage IV intrahepatic cholangiocarcinoma currently on cisplatin and gemcitabine, DM, and anemia who presents to the ED after routine labs obtained at the cancer center on 1/6 were remarkable for evidence of acute kidney injury.  Patient states that she does not have any new symptoms today or yesterday and that she has chronic pain which she has had for months in her right upper quadrant.  She also endorses some congestion which she has had for at least several days.  She also states she has not had a good bowel movement in 3 to 4 days.  She denies any vomiting, decrease in urine output, decrease in p.o. intake, burning with urination, back pain, chest pain, cough, shortness of breath, headache, earache, sore throat, rash or extremity pain.  She denies taking any NSAIDs or any other new medicines.  No prior problems with her kidneys.  Denies tobacco abuse or EtOH use.          Past Medical History:  Diagnosis Date  . Abnormal Pap smear of cervix   . Anemia    h/o  . Diabetes mellitus without complication [facility])     Patient Active Problem List   Diagnosis Date Noted  . Goals of care, counseling/discussion 08/04/2020  . Status post laparoscopic cholecystectomy 06/08/2020  . Intrahepatic cholangiocarcinoma ([place]) 05/31/2020  . 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 Chronic venous insufficiency 08/05/2016  . Diabetes mellitus type 2, uncomplicated ([place]) [id number]    Past Surgical History:  Procedure Laterality Date  . COLONOSCOPY WITH PROPOFOL N/A 06/15/2020   Procedure: COLONOSCOPY WITH  PROPOFOL;  Surgeon: Lin Landsman, MD;  Location: [place];  Service: Endoscopy;  Laterality: N/A;  Diabetic - oral meds  . ENDOMETRIAL ABLATION    . HYSTEROSCOPY WITH D & C    . LEEP      Prior to Admission medications   Medication Sig Start Date End Date Taking? Authorizing Provider  acetaminophen (TYLENOL) 500 MG tablet Take 500 mg by mouth every 6 (six) hours as needed.    [provider]  HYDROcodone-acetaminophen (NORCO/VICODIN) 5-325 MG tablet Take 1 tablet by mouth every 4 (four) hours as needed for moderate pain. 09/26/20   Lloyd Huger, MD  metFORMIN (GLUCOPHAGE) 500 MG tablet Take 500 mg by mouth 2 (two) times daily with a meal.  06/21/19   [provider]  ondansetron (ZOFRAN) 8 MG tablet Take 1 tablet (8 mg total) by mouth every 8 (eight) hours as needed for nausea or vomiting. 08/31/20   Lloyd Huger, MD  prochlorperazine (COMPAZINE) 10 MG tablet Take 1 tablet (10 mg total) by mouth every 6 (six) hours as needed for nausea or vomiting. 08/31/20   Lloyd Huger, MD    Allergies Patient has no known allergies.  Family History  Problem Relation Age of Onset  . Bleeding Disorder Mother   . Biliary Cirrhosis Mother   . Liver cancer Mother   . Hypertension Mother   . Diabetes Father   . Lung cancer Father   . Breast cancer Neg Hx     Social History Social  History   Tobacco Use  . Smoking status: Never Smoker  . Smokeless tobacco: Never Used  Vaping Use  . Vaping Use: Never used  Substance Use Topics  . Alcohol use: No  . Drug use: No    Review of Systems  Review of Systems  Constitutional: Positive for malaise/fatigue. Negative for chills and fever.  HENT: Negative for sore throat.   Eyes: Negative for pain.  Respiratory: Negative for cough and stridor.   Cardiovascular: Negative for chest pain.  Gastrointestinal: Positive for abdominal pain and constipation. Negative for vomiting.  Skin: Negative for rash.   Neurological: Negative for seizures, loss of consciousness and headaches.  Psychiatric/Behavioral: Negative for suicidal ideas.  All other systems reviewed and are negative.     ____________________________________________   PHYSICAL EXAM:  VITAL SIGNS: ED Triage Vitals  Enc Vitals Group     BP 09/28/20 1544 140/71     Pulse Rate 09/28/20 1544 77     Resp 09/28/20 1544 16     Temp 09/28/20 1544 98.8 F (37.1 C)     Temp Source 09/28/20 1850 Oral     SpO2 09/28/20 1544 99 %     Weight 09/28/20 1549 156 lb 8.4 oz (71 kg)     Height 09/28/20 1549 5\' 5"  (1.651 m)     Head Circumference --      Peak Flow --      Pain Score 09/28/20 1549 0     Pain Loc --      Pain Edu? --      Excl. in [place]? --    Vitals:   09/28/20 1850 09/29/20 0020  BP: 132/65 (!) 171/81  Pulse: 70 74  Resp: 17 13  Temp: 98.3 F (36.8 C)   SpO2: 99% 100%   Physical Exam Vitals and nursing note reviewed.  Constitutional:      General: She is not in acute distress.    Appearance: She is well-developed and well-nourished.  HENT:     Head: Normocephalic and atraumatic.     Right Ear: External ear normal.     Left Ear: External ear normal.     Nose: Nose normal.     Mouth/Throat:     Mouth: Mucous membranes are moist.  Eyes:     Conjunctiva/sclera: Conjunctivae normal.  Cardiovascular:     Rate and Rhythm: Normal rate and regular rhythm.     Heart sounds: No murmur heard.   Pulmonary:     Effort: Pulmonary effort is normal. No respiratory distress.     Breath sounds: Normal breath sounds.  Abdominal:     Palpations: Abdomen is soft.     Tenderness: There is abdominal tenderness in the right upper quadrant. There is no right CVA tenderness or left CVA tenderness.  Musculoskeletal:        General: No edema.     Cervical back: Neck supple.     Right lower leg: No edema.     Left lower leg: No edema.  Skin:    General: Skin is warm and dry.  Neurological:     Mental Status: She is alert  and oriented to person, place, and time.  Psychiatric:        Mood and Affect: Mood and affect and mood normal.      ____________________________________________   LABS (all labs ordered are listed, but only abnormal results are displayed)  Labs Reviewed  SARS CORONAVIRUS 2 (TAT 6-24 HRS)  CBC WITH DIFFERENTIAL/PLATELET  COMPREHENSIVE  METABOLIC PANEL  URINALYSIS, COMPLETE (UACMP) WITH MICROSCOPIC   ____________________________________________  EKG  Sinus rhythm with a ventricular rate of 77, normal axis, unremarkable intervals, no clear evidence of acute ischemia.  ____________________________________________  RADIOLOGY   Official radiology report(s): No results found.  ____________________________________________   PROCEDURES  Procedure(s) performed (including Critical Care):  Procedures   ____________________________________________   INITIAL IMPRESSION / ASSESSMENT AND PLAN / ED COURSE      Patient presents after being referred to the ED from cancer center after routine outpatient labs are concerning for acute kidney injury.  On arrival patient is afebrile hemodynamically stable.  Labs obtained on 1/6 at cancer center remarkable for creatinine of 5.11 compared to 0.598 days ago.  Patient's BUN is also elevated at 59 as she is only very slightly acidotic at bicarb of 20 with no other significant electrolyte or metabolic derangements.  CBC yesterday shows evidence of anemia with hemoglobin of 9.5 compared to 10.63 weeks ago.  Unclear etiology at this time although patient reportedly has been peeing without any difficulty I will lower suspicion for postobstructive uropathy.  However will plan to obtain renal ultrasound to assess for evidence of hydronephrosis or distention. she denies any clear GI or volume losses or change in appetite or p.o. intake although prerenal versus intrarenal are both within differential.  No history of trauma and very low suspicion for  acute infectious process or other toxic or metabolic emergency at this time.  I will plan to obtain urine studies as well as renal ultrasound and admit to medicine service for further evaluation management.  We will also give patient small IV fluid bolus.   ____________________________________________   FINAL CLINICAL IMPRESSION(S) / ED DIAGNOSES  Final diagnoses:  AKI (acute kidney injury) ([place])  Cancer ([place])    Medications  lactated ringers bolus 1,000 mL (has no administration in time range)  prochlorperazine (COMPAZINE) tablet 10 mg (has no administration in time range)  HYDROcodone-acetaminophen (NORCO/VICODIN) 5-325 MG per tablet 1 tablet (has no administration in time range)     ED Discharge Orders    None       Note:  This document was prepared using Dragon voice recognition software and may include unintentional dictation errors.   Lucrezia Starch, MD 09/29/20 0530

## 2020-09-29 NOTE — ED Notes (Signed)
Eating breakfast at this time.

## 2020-09-29 NOTE — Progress Notes (Signed)
PROGRESS NOTE    Lauren Lindsey  ZYS:[id number] DOB: 1961-05-09 DOA: 09/29/2020 PCP: [name], MD      Brief Narrative:  Lauren Lindsey is a 60 y.o. 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  Making good urine with IV fluids.  Cr slightly down this afternoon.    -Continue IV fluids -Monitor UOP -Trend BMP  -Dr. [name] was alerted as FYI    Anemia of iron deficiency and chemotherapy Iron sat <10%. -Start iron  Diabetes, well controlled without complication -Hold metformin  -Twice daily accuchecks          Disposition: Status is: Inpatient  Remains inpatient appropriate because:Persistent severe electrolyte disturbances   Dispo: The patient is from: Home              Anticipated d/c is to: Home              Anticipated d/c date is: 3 days              Patient currently is not medically stable to d/c.              MDM: This is a no charge note.  For further details, please see H&P by my partner Dr. Olevia Bowens from earlier today.  The below labs and imaging reports were reviewed and summarized above.    DVT prophylaxis: enoxaparin (LOVENOX) injection 30 mg Start: 09/29/20 1000  Code Status: FULL Family Communication: None             Subjective: Patient feels well.  She is making good urine.  She has no dyspnea, orthopnea, or swelling.        Objective: Vitals:   09/29/20 1100 09/29/20 1130 09/29/20 1350 09/29/20 1814  BP: 118/66 (!) 147/71 134/73 (!) 142/70  Pulse: 64 68 74 77  Resp: 18 16 18 16   Temp:      TempSrc:      SpO2: 99% 99% 100% 98%  Weight:      Height:        Intake/Output Summary (Last 24 hours) at 09/29/2020 1932 Last data filed at 09/29/2020 1814 Gross per 24 hour  Intake 300 ml  Output 1800 ml  Net -1500 ml   Filed Weights   09/28/20 1549  Weight: 71 kg    Examination: The patient was seen and  examined.      Data Reviewed: I have personally reviewed following labs and imaging studies:  CBC: Recent Labs  Lab 09/28/20 0855 09/29/20 0030 09/29/20 1113  WBC 4.2 4.8 3.8*  NEUTROABS 2.8 3.0  --   HGB 9.5* 8.8* 8.5*  HCT 29.5* 28.6* 26.4*  MCV 73.2* 76.5* 74.2*  PLT 336 249 932   Basic Metabolic Panel: Recent Labs  Lab 09/28/20 0855 09/28/20 1337 09/29/20 0030 09/29/20 1113  NA 133* 136 138 140  K 3.5 3.9 3.9 3.8  CL 96* 101 104 106  CO2 24 20* 22 23  GLUCOSE 177* 89 93 128*  BUN 62* 59* 58* 59*  CREATININE 5.37* 5.11* 5.53* 5.43*  CALCIUM 8.1* 7.7* 7.9* 8.0*  PHOS  --   --   --  4.5   GFR: Estimated Creatinine Clearance: 11.2 mL/min (A) (by C-G formula based on SCr of 5.43 mg/dL (H)). Liver Function Tests: Recent Labs  Lab 09/28/20 0855 09/29/20 0030 09/29/20 1113  AST 32 33  --   ALT 33  31  --   ALKPHOS 72 76  --   BILITOT 0.5 0.7  --   PROT 7.4 6.6  --   ALBUMIN 2.9* 2.6* 2.3*   No results for input(s): LIPASE, AMYLASE in the last 168 hours. No results for input(s): AMMONIA in the last 168 hours. Coagulation Profile: No results for input(s): INR, PROTIME in the last 168 hours. Cardiac Enzymes: No results for input(s): CKTOTAL, CKMB, CKMBINDEX, TROPONINI in the last 168 hours. BNP (last 3 results) No results for input(s): PROBNP in the last 8760 hours. HbA1C: No results for input(s): HGBA1C in the last 72 hours. CBG: No results for input(s): GLUCAP in the last 168 hours. Lipid Profile: No results for input(s): CHOL, HDL, LDLCALC, TRIG, CHOLHDL, LDLDIRECT in the last 72 hours. Thyroid Function Tests: No results for input(s): TSH, T4TOTAL, FREET4, T3FREE, THYROIDAB in the last 72 hours. Anemia Panel: Recent Labs    09/28/20 2329 09/29/20 0423  VITAMINB12  --  272  FOLATE  --  13.3  FERRITIN  --  796*  TIBC  --  172*  IRON  --  15*  RETICCTPCT <0.4*  --    Urine analysis:    Component Value Date/Time   COLORURINE STRAW (A)  09/29/2020 0210   APPEARANCEUR CLEAR (A) 09/29/2020 0210   LABSPEC 1.003 (L) 09/29/2020 0210   PHURINE 6.0 09/29/2020 0210   GLUCOSEU NEGATIVE 09/29/2020 0210   HGBUR SMALL (A) 09/29/2020 0210   BILIRUBINUR NEGATIVE 09/29/2020 0210   KETONESUR NEGATIVE 09/29/2020 0210   PROTEINUR 30 (A) 09/29/2020 0210   NITRITE NEGATIVE 09/29/2020 0210   LEUKOCYTESUR NEGATIVE 09/29/2020 0210   Sepsis Labs: @LABRCNTIP (procalcitonin:4,lacticacidven:4)  )No results found for this or any previous visit (from the past 240 hour(s)).       Radiology Studies: US Renal  Result Date: 09/29/2020 CLINICAL DATA:  Acute renal insufficiency EXAM: RENAL / URINARY TRACT ULTRASOUND COMPLETE COMPARISON:  09/01/2020, CT 06/16/2020 FINDINGS: Right Kidney: Renal measurements: 14.4 x 6.0 x 6.2 cm = volume: 277 mL. Renal cortical thickness and cortical echogenicity are normal. No intrarenal masses or calcifications are seen. There is mild distension of the renal pelvis without associated caliectasis. Left Kidney: Renal measurements: 13.2 x 7.4 x 6.9 cm = volume: 351 mL. Renal cortical thickness and cortical echogenicity are normal. No intrarenal masses or calcifications are seen. There is mild distension of the renal pelvis without associated caliectasis. Bladder: The bladder is mildly distended. No intraluminal masses or calcifications are seen. Bilateral ureteral jets are identified. Other: None. IMPRESSION: Mild distention of the renal pelves bilaterally possibly related to urinary retention. No associated caliectasis. Otherwise normal renal sonogram. Electronically Signed   By: Fidela Salisbury MD   On: 09/29/2020 01:46        Scheduled Meds: . Derrill Memo ON 09/30/2020] docusate sodium  100 mg Oral Daily  . enoxaparin (LOVENOX) injection  30 mg Subcutaneous Q24H  . [START ON 09/30/2020] ferrous sulfate  325 mg Oral BID WC   Continuous Infusions: . lactated ringers 125 mL/hr at 09/29/20 1032     LOS: 0 days    Time  spent: 20 minutes    Edwin Dada, MD [facility] 09/29/2020, 7:32 PM     Please page though [place] or Epic secure chat:  For password, contact charge nurse

## 2020-09-29 NOTE — H&P (Signed)
History and Physical    Lauren Lindsey:[id number] DOB: 1961-05-01 DOA: 09/29/2020  PCP: [name], MD   Patient coming from: Home.   I have personally briefly reviewed patient's old medical records in [place]  Chief Complaint: Abnormal lab results.  HPI: Lauren Lindsey is a 60 y.o. female with medical history significant of abnormal Pap smear, normocytic anemia, type II DM, recently diagnosed with cholangiocarcinoma who underwent chemotherapy last week and is being referred to the emergency department after post chemo follow-up labs showed a significant increase in creatinine.  She has been nauseous and weak.  She does not use NSAIDs other than acetaminophen.  The patient states that she has continued to urinate above four times a day, but the urine at times may look darker.  She denies flank pain, dysuria, frequency or hematuria.  She has not been eating as usual and has been trying to drink water. She denies fever, chills, rhinorrhea, sore throat, dyspnea, chest pain, palpitations, dizziness, diaphoresis, PND, orthopnea or recent pitting edema lower extremities.  No abdominal pain, emesis, diarrhea, constipation, melena or hematochezia.  No polyuria, polydipsia, polyphagia or blurred vision.  ED Course: Initial vital signs were temperature   Review of Systems: As per HPI otherwise all other systems reviewed and are negative.  Past Medical History:  Diagnosis Date  . Abnormal Pap smear of cervix   . Anemia    h/o  . Diabetes mellitus without complication [facility])    Past Surgical History:  Procedure Laterality Date  . COLONOSCOPY WITH PROPOFOL N/A 06/15/2020   Procedure: COLONOSCOPY WITH PROPOFOL;  Surgeon: Lin Landsman, MD;  Location: [place];  Service: Endoscopy;  Laterality: N/A;  Diabetic - oral meds  . ENDOMETRIAL ABLATION    . HYSTEROSCOPY WITH D & C    . LEEP     Social History  reports that she has never smoked. She has never used smokeless  tobacco. She reports that she does not drink alcohol and does not use drugs.  No Known Allergies  Family History  Problem Relation Age of Onset  . Bleeding Disorder Mother   . Biliary Cirrhosis Mother   . Liver cancer Mother   . Hypertension Mother   . Diabetes Father   . Lung cancer Father   . Breast cancer Neg Hx    Prior to Admission medications   Medication Sig Start Date End Date Taking? Authorizing Provider  acetaminophen (TYLENOL) 500 MG tablet Take 500 mg by mouth every 6 (six) hours as needed.    [provider]  HYDROcodone-acetaminophen (NORCO/VICODIN) 5-325 MG tablet Take 1 tablet by mouth every 4 (four) hours as needed for moderate pain. 09/26/20   Lloyd Huger, MD  metFORMIN (GLUCOPHAGE) 500 MG tablet Take 500 mg by mouth 2 (two) times daily with a meal.  06/21/19   [provider]  ondansetron (ZOFRAN) 8 MG tablet Take 1 tablet (8 mg total) by mouth every 8 (eight) hours as needed for nausea or vomiting. 08/31/20   Lloyd Huger, MD  prochlorperazine (COMPAZINE) 10 MG tablet Take 1 tablet (10 mg total) by mouth every 6 (six) hours as needed for nausea or vomiting. 08/31/20   Lloyd Huger, MD   Physical Exam: Vitals:   09/28/20 1544 09/28/20 1549 09/28/20 1850 09/29/20 0020  BP: 140/71  132/65 (!) 171/81  Pulse: 77  70 74  Resp: 16  17 13   Temp: 98.8 F (37.1 C)  98.3 F (  36.8 C)   TempSrc:   Oral   SpO2: 99%  99% 100%  Weight:  71 kg    Height:  5\' 5"  (1.651 m)     Constitutional: NAD, calm, comfortable Eyes: PERRL, lids and conjunctivae normal ENMT: Mucous membranes are moist. Posterior pharynx clear of any exudate or lesions. Neck: normal, supple, no masses, no thyromegaly Respiratory: clear to auscultation bilaterally, no wheezing, no crackles. Normal respiratory effort. No accessory muscle use.  Cardiovascular: Regular rate and rhythm, no murmurs / rubs / gallops. No extremity edema. 2+ pedal pulses. No carotid bruits.   Abdomen: no tenderness, no masses palpated. No hepatosplenomegaly. Bowel sounds positive.  Musculoskeletal: no clubbing / cyanosis. No joint deformity upper and lower extremities. Good ROM, no contractures. Normal muscle tone.  Skin: no rashes, lesions, ulcers. No induration Neurologic: CN 2-12 grossly intact. Sensation intact, DTR normal. Strength 5/5 in all 4.  Psychiatric: Normal judgment and insight. Alert and oriented x 3. Normal mood.   Labs on Admission: I have personally reviewed following labs and imaging studies  CBC: Recent Labs  Lab 09/28/20 0855 09/29/20 0030  WBC 4.2 4.8  NEUTROABS 2.8 3.0  HGB 9.5* 8.8*  HCT 29.5* 28.6*  MCV 73.2* 76.5*  PLT 336 462    Basic Metabolic Panel: Recent Labs  Lab 09/28/20 0855 09/28/20 1337  NA 133* 136  K 3.5 3.9  CL 96* 101  CO2 24 20*  GLUCOSE 177* 89  BUN 62* 59*  CREATININE 5.37* 5.11*  CALCIUM 8.1* 7.7*    GFR: Estimated Creatinine Clearance: 11.9 mL/min (A) (by C-G formula based on SCr of 5.11 mg/dL (H)).  Liver Function Tests: Recent Labs  Lab 09/28/20 0855  AST 32  ALT 33  ALKPHOS 72  BILITOT 0.5  PROT 7.4  ALBUMIN 2.9*   Radiological Exams on Admission: No results found.  EKG: Independently reviewed. Vent. rate 77 BPM PR interval 144 ms QRS duration 76 ms QT/QTc 346/391 ms P-R-T axes 60 59 71  Assessment/Plan Principal Problem:   AKI (acute kidney injury) ([place]) Observation/progressive unit. Continue IV fluids. Monitor intake and output. Avoid nephrotoxic agents. Monitor CBC, renal function electrolytes. Renal US showed mild distention of renal pelves bilaterally. Consider nephrology evaluation if no improvement.  Active Problems:   Intrahepatic cholangiocarcinoma ([place]) Chemotherapy/follow-up per oncology. Antiemetics as needed. Analgesics as needed.    Type 2 diabetes mellitus (H[facility]) Carbohydrate modified diet. Hold Metformin due to AKI. CBG monitoring with R ISS.    Microcytic  anemia Check anemia panel. Monitor H&H. Transfuse as needed.    DVT prophylaxis: Lovenox SQ. Code Status:   Full code. Family Communication:   Disposition Plan:   Patient is from:  Home.  Anticipated DC to:  Home.  Anticipated DC date:  09/30/2020.  Anticipated DC barriers: Clinical status.  Consults called: Admission status:  Observation/progressive unit.  Severity of Illness: High due to AKI several days after receiving chemotherapy for cholangiocarcinoma. The patient will need to remain in the hospital for IV hydration and close monitoring.  Reubin Milan MD [facility]  How to contact the [facility] Attending or Consulting provider [place] or covering provider during after hours [place], for this patient?   1. Check the care team in [facility] and look for a) attending/consulting [facility] provider listed and b) the [facility] team listed 2. Log into [email or web address] and use [place]'s universal password to access. If you do not have the password, please contact the hospital operator. 3. Locate  the [facility] provider you are looking for under [facility] and page to a number that you can be directly reached. 4. If you still have difficulty reaching the provider, please page the [facility] (Director on Call) for the Hospitalists listed on amion for assistance.  09/29/2020, 1:07 AM   This document was prepared using Dragon voice recognition software and may contain some unintended transcription errors.

## 2020-09-29 NOTE — ED Notes (Signed)
Pt denies having any pain or nausea at this time.

## 2020-09-29 NOTE — ED Notes (Signed)
Pt up to bathroom, ua to lab.

## 2020-09-29 NOTE — Consult Note (Signed)
Hematology/Oncology Consult note [facility] [phone] [phone]  Patient Care Team: [name], MD as PCP - General (Family Medicine) Patient, No Pcp Per (General Practice) [name], RN as Oncology Nurse Navigator   Name of the patient: Lauren Lindsey  XR:[id number]  30-Jan-1961    Reason for consult: Metastatic cholangiocarcinoma   Requesting physician: Dr. Myrene Buddy  Date of visit: 09/29/2020   History of presenting illness- Patient is a 60 year old female with a history of stage IV intrahepatic cholangiocarcinoma.  She has been getting gemcitabine cisplatin with Dr. [name] as an outpatient.  She had a normal renal function with a creatinine of 0.5 on 09/21/2020.  She had routine labs checked which showed that her creatinine was up to 5.3 and she was sent to the ER for further evaluation.  She is currently on IV fluids and renal ultrasound showed mild distention of renal pelvis bilaterally but no evidence of hydronephrosis.  She reports feeling some fatigue but denies other complaints  ECOG PS- 1  Pain scale- 0   Review of systems- Review of Systems  Constitutional: Positive for malaise/fatigue. Negative for chills, fever and weight loss.  HENT: Negative for congestion, ear discharge and nosebleeds.   Eyes: Negative for blurred vision.  Respiratory: Negative for cough, hemoptysis, sputum production, shortness of breath and wheezing.   Cardiovascular: Negative for chest pain, palpitations, orthopnea and claudication.  Gastrointestinal: Negative for abdominal pain, blood in stool, constipation, diarrhea, heartburn, melena, nausea and vomiting.  Genitourinary: Negative for dysuria, flank pain, frequency, hematuria and urgency.  Musculoskeletal: Negative for back pain, joint pain and myalgias.  Skin: Negative for rash.  Neurological: Negative for dizziness, tingling, focal weakness, seizures, weakness and  headaches.  Endo/Heme/Allergies: Does not bruise/bleed easily.  Psychiatric/Behavioral: Negative for depression and suicidal ideas. The patient does not have insomnia.     No Known Allergies  Patient Active Problem List   Diagnosis Date Noted  . AKI (acute kidney injury) ([place]) 09/29/2020  . Type 2 diabetes mellitus ([place]) 09/29/2020  . Normocytic anemia 09/29/2020  . Microcytic anemia 09/29/2020  . Goals of care, counseling/discussion 08/04/2020  . Status post laparoscopic cholecystectomy 06/08/2020  . Intrahepatic cholangiocarcinoma ([place]) 05/31/2020  . 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 Chronic venous insufficiency 08/05/2016  . Diabetes mellitus type 2, uncomplicated ([place]) [id number]     Past Medical History:  Diagnosis Date  . Abnormal Pap smear of cervix   . Anemia    h/o  . Diabetes mellitus without complication [facility])      Past Surgical History:  Procedure Laterality Date  . COLONOSCOPY WITH PROPOFOL N/A 06/15/2020   Procedure: COLONOSCOPY WITH PROPOFOL;  Surgeon: Lin Landsman, MD;  Location: [place];  Service: Endoscopy;  Laterality: N/A;  Diabetic - oral meds  . ENDOMETRIAL ABLATION    . HYSTEROSCOPY WITH D & C    . LEEP      Social History   Socioeconomic History  . Marital status: Married    Spouse name: Not on file  . Number of children: Not on file  . Years of education: Not on file  . Highest education level: Not on file  Occupational History  . Not on file  Tobacco Use  . Smoking status: Never Smoker  . Smokeless tobacco: Never Used  Vaping Use  . Vaping Use: Never used  Substance and Sexual Activity  . Alcohol use: No  . Drug use: No  . Sexual activity: Yes  Birth control/protection: Post-menopausal  Other Topics Concern  . Not on file  Social History Narrative  . Not on file   Social Determinants of Health   Financial Resource Strain: Not on file  Food Insecurity: Not on file  Transportation Needs:  Not on file  Physical Activity: Not on file  Stress: Not on file  Social Connections: Not on file  Intimate Partner Violence: Not on file     Family History  Problem Relation Age of Onset  . Bleeding Disorder Mother   . Biliary Cirrhosis Mother   . Liver cancer Mother   . Hypertension Mother   . Diabetes Father   . Lung cancer Father   . Breast cancer Neg Hx      Current Facility-Administered Medications:  .  acetaminophen (TYLENOL) tablet 650 mg, 650 mg, Oral, Q6H PRN **OR** acetaminophen (TYLENOL) suppository 650 mg, 650 mg, Rectal, Q6H PRN, Reubin Milan, MD .  enoxaparin (LOVENOX) injection 30 mg, 30 mg, Subcutaneous, Q24H, Reubin Milan, MD, 30 mg at 09/29/20 0900 .  HYDROcodone-acetaminophen (NORCO/VICODIN) 5-325 MG per tablet 1 tablet, 1 tablet, Oral, Q4H PRN, Reubin Milan, MD .  lactated ringers infusion, , Intravenous, Continuous, Reubin Milan, MD, Last Rate: 125 mL/hr at 09/29/20 1032, New Bag at 09/29/20 1032 .  ondansetron (ZOFRAN) tablet 4 mg, 4 mg, Oral, Q6H PRN **OR** ondansetron (ZOFRAN) injection 4 mg, 4 mg, Intravenous, Q6H PRN, Reubin Milan, MD .  prochlorperazine (COMPAZINE) tablet 10 mg, 10 mg, Oral, Q6H PRN, Reubin Milan, MD  Current Outpatient Medications:  .  acetaminophen (TYLENOL) 500 MG tablet, Take 500 mg by mouth every 6 (six) hours as needed., Disp: , Rfl:  .  HYDROcodone-acetaminophen (NORCO/VICODIN) 5-325 MG tablet, Take 1 tablet by mouth every 4 (four) hours as needed for moderate pain., Disp: 60 tablet, Rfl: 0 .  metFORMIN (GLUCOPHAGE) 500 MG tablet, Take 500 mg by mouth 2 (two) times daily with a meal. , Disp: , Rfl:  .  ondansetron (ZOFRAN) 8 MG tablet, Take 1 tablet (8 mg total) by mouth every 8 (eight) hours as needed for nausea or vomiting., Disp: 30 tablet, Rfl: 2 .  prochlorperazine (COMPAZINE) 10 MG tablet, Take 1 tablet (10 mg total) by mouth every 6 (six) hours as needed for nausea or vomiting., Disp:  30 tablet, Rfl: 2   Physical exam:  Vitals:   09/29/20 1000 09/29/20 1100 09/29/20 1130 09/29/20 1350  BP: 121/68 118/66 (!) 147/71 134/73  Pulse: 68 64 68 74  Resp: 17 18 16 18   Temp:      TempSrc:      SpO2: 97% 99% 99% 100%  Weight:      Height:       Physical Exam Eyes:     Extraocular Movements: EOM normal.  Cardiovascular:     Rate and Rhythm: Normal rate and regular rhythm.     Heart sounds: Normal heart sounds.  Pulmonary:     Effort: Pulmonary effort is normal.     Breath sounds: Normal breath sounds.  Abdominal:     General: Bowel sounds are normal.     Palpations: Abdomen is soft.  Skin:    General: Skin is warm and dry.  Neurological:     Mental Status: She is alert and oriented to person, place, and time.        CMP Latest Ref Rng & Units 09/29/2020  Glucose 70 - 99 mg/dL 128(H)  BUN 6 - 20 mg/dL  59(H)  Creatinine 0.44 - 1.00 mg/dL 5.43(H)  Sodium 135 - 145 mmol/L 140  Potassium 3.5 - 5.1 mmol/L 3.8  Chloride 98 - 111 mmol/L 106  CO2 22 - 32 mmol/L 23  Calcium 8.9 - 10.3 mg/dL 8.0(L)  Total Protein 6.5 - 8.1 g/dL -  Total Bilirubin 0.3 - 1.2 mg/dL -  Alkaline Phos 38 - 126 U/L -  AST 15 - 41 U/L -  ALT 0 - 44 U/L -   CBC Latest Ref Rng & Units 09/29/2020  WBC 4.0 - 10.5 K/uL 3.8(L)  Hemoglobin 12.0 - 15.0 g/dL 8.5(L)  Hematocrit 36.0 - 46.0 % 26.4(L)  Platelets 150 - 400 K/uL 224    @IMAGES @  US Abdomen Complete  Result Date: 09/01/2020 CLINICAL DATA:  Right upper quadrant pain. History of cholecystectomy. History of peritoneal carcinomatosis. History of biopsy-proven carcinoma right lobe of liver. EXAM: ABDOMEN ULTRASOUND COMPLETE COMPARISON:  MRI liver 07/20/2020 FINDINGS: Gallbladder: Surgically absent Common bile duct: Diameter: 4 mm Liver: Hypoechoic mass in the anterior right lobe of the liver measuring 5.7 x 4.5 x 3.7 cm. Portal vein is patent on color Doppler imaging with normal direction of blood flow towards the liver. IVC: No  abnormality visualized. Pancreas: Visualized portion unremarkable. Spleen: Size and appearance within normal limits. Right Kidney: Length: 13.3 cm. 2 cm right parapelvic cyst. Echogenicity within normal limits. No mass or hydronephrosis visualized. Left Kidney: Length: 11.7 cm. Echogenicity within normal limits. No mass or hydronephrosis visualized. Abdominal aorta: No aneurysm visualized. Other findings: Negative for ascites. IMPRESSION: Mass lesion right lobe liver, known carcinoma. Postop cholecystectomy.  No biliary dilatation. No free fluid. Electronically Signed   By: Franchot Gallo M.D.   On: 09/01/2020 15:03   US Renal  Result Date: 09/29/2020 CLINICAL DATA:  Acute renal insufficiency EXAM: RENAL / URINARY TRACT ULTRASOUND COMPLETE COMPARISON:  09/01/2020, CT 06/16/2020 FINDINGS: Right Kidney: Renal measurements: 14.4 x 6.0 x 6.2 cm = volume: 277 mL. Renal cortical thickness and cortical echogenicity are normal. No intrarenal masses or calcifications are seen. There is mild distension of the renal pelvis without associated caliectasis. Left Kidney: Renal measurements: 13.2 x 7.4 x 6.9 cm = volume: 351 mL. Renal cortical thickness and cortical echogenicity are normal. No intrarenal masses or calcifications are seen. There is mild distension of the renal pelvis without associated caliectasis. Bladder: The bladder is mildly distended. No intraluminal masses or calcifications are seen. Bilateral ureteral jets are identified. Other: None. IMPRESSION: Mild distention of the renal pelves bilaterally possibly related to urinary retention. No associated caliectasis. Otherwise normal renal sonogram. Electronically Signed   By: Fidela Salisbury MD   On: 09/29/2020 01:46    Assessment and plan- Patient is a 60 y.o. female with history of intrahepatic cholangiocarcinoma on gemcitabine and cisplatin admitted for AKI  AKI: She has received IV fluids over the last 12 to 14 hours but there has been no improvement in  her creatinine so far.  Recommend having nephrology on board if creatinine does not improve in the next 24 hours. Cisplatin can be associated with acute kidney injury.  No hydronephrosis seen on ultrasound  Anemia: Likely chemotherapy-induced. Iron studies are indicative of anemia of chronic disease B12 levels are mildly low at 272 and she would benefit from B12 replacement.     Visit Diagnosis 1. AKI 2. Chemo induced anemia 3. Intrahepatic cholangiocarcinoma  Dr. Randa Evens, MD, MPH [facility] at [facility] [phone] 09/29/2020  9:01 PM

## 2020-09-30 ENCOUNTER — Encounter: Payer: Self-pay | Admitting: Family Medicine

## 2020-09-30 DIAGNOSIS — N179 Acute kidney failure, unspecified: Secondary | ICD-10-CM | POA: Diagnosis not present

## 2020-09-30 LAB — BASIC METABOLIC PANEL
Anion gap: 12 (ref 5–15)
BUN: 60 mg/dL — ABNORMAL HIGH (ref 6–20)
CO2: 24 mmol/L (ref 22–32)
Calcium: 8.2 mg/dL — ABNORMAL LOW (ref 8.9–10.3)
Chloride: 107 mmol/L (ref 98–111)
Creatinine, Ser: 4.59 mg/dL — ABNORMAL HIGH (ref 0.44–1.00)
GFR, Estimated: 10 mL/min — ABNORMAL LOW (ref >60)
Glucose, Bld: 103 mg/dL — ABNORMAL HIGH (ref 70–99)
Potassium: 3.6 mmol/L (ref 3.5–5.1)
Sodium: 143 mmol/L (ref 135–145)

## 2020-09-30 LAB — GLUCOSE, CAPILLARY
Glucose-Capillary: 99 mg/dL (ref 70–99)
Glucose-Capillary: 109 mg/dL — ABNORMAL HIGH (ref 70–99)

## 2020-09-30 LAB — CBC
HCT: 28.7 % — ABNORMAL LOW (ref 36.0–46.0)
Hemoglobin: 9.2 g/dL — ABNORMAL LOW (ref 12.0–15.0)
MCH: 23.9 pg — ABNORMAL LOW (ref 26.0–34.0)
MCHC: 32.1 g/dL (ref 30.0–36.0)
MCV: 74.5 fL — ABNORMAL LOW (ref 80.0–100.0)
Platelets: 199 10*3/uL (ref 150–400)
RBC: 3.85 MIL/uL — ABNORMAL LOW (ref 3.87–5.11)
RDW: 17.5 % — ABNORMAL HIGH (ref 11.5–15.5)
WBC: 5.4 10*3/uL (ref 4.0–10.5)
nRBC: 0 % (ref 0.0–0.2)

## 2020-09-30 LAB — SARS CORONAVIRUS 2 (TAT 6-24 HRS): SARS Coronavirus 2: POSITIVE — AB

## 2020-09-30 MED ORDER — VITAMIN B-12 1000 MCG PO TABS
1000.0000 ug | ORAL_TABLET | Freq: Every day | ORAL | Status: DC
  Administered 2020-09-30 – 2020-10-02 (×3): 1000 ug via ORAL
  Filled 2020-09-30 (×3): qty 1

## 2020-09-30 NOTE — Progress Notes (Signed)
PROGRESS NOTE    Lauren Lindsey  [id number] DOB: Feb 15, 1961 DOA: 09/29/2020 PCP: [name], MD   Chief Complaint  Patient presents with  . 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 Patient was admitted with AKI/ATN  Subjective: Seen and examined this morning, having less energy, voiding well.  No other new complaints no nausea vomiting.  Assessment & Plan:  AKI/ATN nonoliguric: Renal function normal 0.5 on 09/21/2020, elevated to 5.3 ON O/P WORK UP.  Likely from cisplatin, along with dehydration.  Renal ultrasound no obstructive features or hydronephrosis. Oncology on board.  Patient is being hydrated with IV fluids-continue the same,Urine output reassuring 4350 ml- and creat further down- so hold off nephro consult  but if creatinine does not improve significantly can consider nephrology evalf/u. Minimize nonessential nephrotoxic medication. Recent Labs  Lab 09/28/20 0855 09/28/20 1337 09/29/20 0030 09/29/20 1113  BUN 62* 59* 58* 59*  CREATININE 5.37* 5.11* 5.53* 5.43*   Intrahepatic cholangiocarcinoma  STAGE IV: Chemo on hold due to AKI.  Oncology following.  Type 2 diabetes mellitus:   Unknown Hemoglobin A1c.  Monitor CBG and sliding scale. Recent Labs  Lab 09/29/20 2107  GLUCAP 104*   No results found for: HGBA1C  Microcytic anemia likely chemotherapy-induced along with anemia of chronic disease and mildly low B12, will replace B12, monitor H&H Recent Labs  Lab 09/28/20 0855 09/29/20 0030 09/29/20 1113 09/30/20 0509  HGB 9.5* 8.8* 8.5* 9.2*  HCT 29.5* 28.6* 26.4* 28.7*   Nutrition: Diet Order            Diet heart healthy/carb modified Room service appropriate? Yes; Fluid consistency: Thin  Diet effective now                 Body mass index is 26.05 kg/m. DVT prophylaxis: enoxaparin (LOVENOX) injection 30 mg Start: 09/29/20  1000 Code Status:   Code Status: Full Code  Family Communication: plan of care discussed with patient at bedside.  Status is: Inpatient Remains inpatient appropriate because:IV treatments appropriate due to intensity of illness or inability to take PO and Inpatient level of care appropriate due to severity of illness  Dispo: The patient is from: Home              Anticipated d/c is to: Home              Anticipated d/c date is: 2 days              Patient currently is not medically stable to d/c.  Consultants:see note  Procedures:see note  Culture/Microbiology No results found for: SDES, SPECREQUEST, CULT, REPTSTATUS  Other culture-see note  Medications: Scheduled Meds: . docusate sodium  100 mg Oral Daily  . enoxaparin (LOVENOX) injection  30 mg Subcutaneous Q24H  . ferrous sulfate  325 mg Oral BID WC  . insulin aspart  0-5 Units Subcutaneous QHS  . insulin aspart  0-6 Units Subcutaneous BID AC   Continuous Infusions: . lactated ringers 125 mL/hr at 09/30/20 [id number]    Antimicrobials: Anti-infectives (From admission, onward)   None     Objective: Vitals: Today's Vitals   09/30/20 0230 09/30/20 0245 09/30/20 0325 09/30/20 0340  BP: (!) 146/68  (!) 152/73   Pulse: 60 61 72   Resp:  12 18   Temp:   98.4 F (36.9 C)   TempSrc:   Oral   SpO2:  97% 98% 99%   Weight:      Height:      PainSc:    0-No pain    Intake/Output Summary (Last 24 hours) at 09/30/2020 0740 Last data filed at 09/30/2020 0622 Gross per 24 hour  Intake 5109.18 ml  Output 4350 ml  Net 759.18 ml   Filed Weights   09/28/20 1549  Weight: 71 kg   Weight change:   Intake/Output from previous day: 01/07 0701 - 01/08 0700 In: 5109.2 [P.O.:300; I.V.:4809.2] Out: 4350 [Urine:4350] Intake/Output this shift: No intake/output data recorded.  Examination: General exam: AAO ,NAD, weak appearing. HEENT:Oral mucosa moist, Ear/Nose WNL grossly,dentition normal. Respiratory system: bilaterally clear,no  wheezing or crackles,no use of accessory muscle, non tender. Cardiovascular system: S1 & S2 +, regular, No JVD. Gastrointestinal system: Abdomen soft, NT,ND, BS+. Nervous System:Alert, awake, moving extremities and grossly nonfocal Extremities: No edema, distal peripheral pulses palpable.  Skin: No rashes,no icterus. MSK: Normal muscle bulk,tone, power  Data Reviewed: I have personally reviewed following labs and imaging studies CBC: Recent Labs  Lab 09/28/20 0855 09/29/20 0030 09/29/20 1113 09/30/20 0509  WBC 4.2 4.8 3.8* 5.4  NEUTROABS 2.8 3.0  --   --   HGB 9.5* 8.8* 8.5* 9.2*  HCT 29.5* 28.6* 26.4* 28.7*  MCV 73.2* 76.5* 74.2* 74.5*  PLT 336 249 224 737   Basic Metabolic Panel: Recent Labs  Lab 09/28/20 0855 09/28/20 1337 09/29/20 0030 09/29/20 1113  NA 133* 136 138 140  K 3.5 3.9 3.9 3.8  CL 96* 101 104 106  CO2 24 20* 22 23  GLUCOSE 177* 89 93 128*  BUN 62* 59* 58* 59*  CREATININE 5.37* 5.11* 5.53* 5.43*  CALCIUM 8.1* 7.7* 7.9* 8.0*  PHOS  --   --   --  4.5   GFR: Estimated Creatinine Clearance: 11.2 mL/min (A) (by C-G formula based on SCr of 5.43 mg/dL (H)). Liver Function Tests: Recent Labs  Lab 09/28/20 0855 09/29/20 0030 09/29/20 1113  AST 32 33  --   ALT 33 31  --   ALKPHOS 72 76  --   BILITOT 0.5 0.7  --   PROT 7.4 6.6  --   ALBUMIN 2.9* 2.6* 2.3*   No results for input(s): LIPASE, AMYLASE in the last 168 hours. No results for input(s): AMMONIA in the last 168 hours. Coagulation Profile: No results for input(s): INR, PROTIME in the last 168 hours. Cardiac Enzymes: No results for input(s): CKTOTAL, CKMB, CKMBINDEX, TROPONINI in the last 168 hours. BNP (last 3 results) No results for input(s): PROBNP in the last 8760 hours. HbA1C: No results for input(s): HGBA1C in the last 72 hours. CBG: Recent Labs  Lab 09/29/20 2107  GLUCAP 104*   Lipid Profile: No results for input(s): CHOL, HDL, LDLCALC, TRIG, CHOLHDL, LDLDIRECT in the last 72  hours. Thyroid Function Tests: No results for input(s): TSH, T4TOTAL, FREET4, T3FREE, THYROIDAB in the last 72 hours. Anemia Panel: Recent Labs    09/28/20 2329 09/29/20 0423  VITAMINB12  --  272  FOLATE  --  13.3  FERRITIN  --  796*  TIBC  --  172*  IRON  --  15*  RETICCTPCT <0.4*  --    Sepsis Labs: No results for input(s): PROCALCITON, LATICACIDVEN in the last 168 hours.  No results found for this or any previous visit (from the past 240 hour(s)).   Radiology Studies: US Renal  Result Date: 09/29/2020 CLINICAL DATA:  Acute renal insufficiency EXAM: RENAL / URINARY  TRACT ULTRASOUND COMPLETE COMPARISON:  09/01/2020, CT 06/16/2020 FINDINGS: Right Kidney: Renal measurements: 14.4 x 6.0 x 6.2 cm = volume: 277 mL. Renal cortical thickness and cortical echogenicity are normal. No intrarenal masses or calcifications are seen. There is mild distension of the renal pelvis without associated caliectasis. Left Kidney: Renal measurements: 13.2 x 7.4 x 6.9 cm = volume: 351 mL. Renal cortical thickness and cortical echogenicity are normal. No intrarenal masses or calcifications are seen. There is mild distension of the renal pelvis without associated caliectasis. Bladder: The bladder is mildly distended. No intraluminal masses or calcifications are seen. Bilateral ureteral jets are identified. Other: None. IMPRESSION: Mild distention of the renal pelves bilaterally possibly related to urinary retention. No associated caliectasis. Otherwise normal renal sonogram. Electronically Signed   By: Fidela Salisbury MD   On: 09/29/2020 01:46     LOS: 1 day   Antonieta Pert, MD [facility]  09/30/2020, 7:40 AM

## 2020-10-01 DIAGNOSIS — N179 Acute kidney failure, unspecified: Secondary | ICD-10-CM | POA: Diagnosis not present

## 2020-10-01 LAB — CBC
HCT: 24.6 % — ABNORMAL LOW (ref 36.0–46.0)
Hemoglobin: 7.8 g/dL — ABNORMAL LOW (ref 12.0–15.0)
MCH: 23.5 pg — ABNORMAL LOW (ref 26.0–34.0)
MCHC: 31.7 g/dL (ref 30.0–36.0)
MCV: 74.1 fL — ABNORMAL LOW (ref 80.0–100.0)
Platelets: 139 10*3/uL — ABNORMAL LOW (ref 150–400)
RBC: 3.32 MIL/uL — ABNORMAL LOW (ref 3.87–5.11)
RDW: 17.2 % — ABNORMAL HIGH (ref 11.5–15.5)
WBC: 4.8 10*3/uL (ref 4.0–10.5)
nRBC: 0 % (ref 0.0–0.2)

## 2020-10-01 LAB — GLUCOSE, CAPILLARY
Glucose-Capillary: 76 mg/dL (ref 70–99)
Glucose-Capillary: 84 mg/dL (ref 70–99)
Glucose-Capillary: 143 mg/dL — ABNORMAL HIGH (ref 70–99)
Glucose-Capillary: 97 mg/dL (ref 70–99)

## 2020-10-01 LAB — BASIC METABOLIC PANEL
Anion gap: 11 (ref 5–15)
BUN: 44 mg/dL — ABNORMAL HIGH (ref 6–20)
CO2: 25 mmol/L (ref 22–32)
Calcium: 7.7 mg/dL — ABNORMAL LOW (ref 8.9–10.3)
Chloride: 105 mmol/L (ref 98–111)
Creatinine, Ser: 3.3 mg/dL — ABNORMAL HIGH (ref 0.44–1.00)
GFR, Estimated: 16 mL/min — ABNORMAL LOW (ref >60)
Glucose, Bld: 107 mg/dL — ABNORMAL HIGH (ref 70–99)
Potassium: 3.1 mmol/L — ABNORMAL LOW (ref 3.5–5.1)
Sodium: 141 mmol/L (ref 135–145)

## 2020-10-01 LAB — C-REACTIVE PROTEIN: CRP: 12.9 mg/dL — ABNORMAL HIGH (ref <1.0)

## 2020-10-01 LAB — FERRITIN: Ferritin: 794 ng/mL — ABNORMAL HIGH (ref 11–307)

## 2020-10-01 LAB — PROCALCITONIN: Procalcitonin: 0.2 ng/mL

## 2020-10-01 MED ORDER — POTASSIUM CHLORIDE CRYS ER 20 MEQ PO TBCR
40.0000 meq | EXTENDED_RELEASE_TABLET | Freq: Once | ORAL | Status: AC
  Administered 2020-10-01: 40 meq via ORAL
  Filled 2020-10-01: qty 2

## 2020-10-01 MED ORDER — FLUTICASONE PROPIONATE 50 MCG/ACT NA SUSP
1.0000 | Freq: Every day | NASAL | Status: DC
  Administered 2020-10-01 – 2020-10-02 (×2): 1 via NASAL
  Filled 2020-10-01: qty 16

## 2020-10-01 MED ORDER — POTASSIUM CHLORIDE CRYS ER 20 MEQ PO TBCR
20.0000 meq | EXTENDED_RELEASE_TABLET | Freq: Every day | ORAL | Status: DC
  Administered 2020-10-01 – 2020-10-02 (×2): 20 meq via ORAL
  Filled 2020-10-01 (×3): qty 1

## 2020-10-01 NOTE — Progress Notes (Signed)
Pt's husband brought a bottle, total 24 counts of 500 mg metformin to facility. Pt stating "I take 500 mg, twice daily. I do not do insulin injection."  Educating pt about insulin. Pt verbalized understanding.  MD notified. Per MD, he will review pt's PTA med.

## 2020-10-01 NOTE — Progress Notes (Signed)
PROGRESS NOTE    Lauren Lindsey  WGN:[id number] DOB: 1961/04/17 DOA: 09/29/2020 PCP: [name], MD   Chief Complaint  Patient presents with  . 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 Patient was admitted with AKI/ATN and  IV fluid hydration with improving creatinine.  Renal ultrasound no evidence of obstructive uropathy.  Subjective: Alert awake, no more weakness today denies chest pain nausea vomiting shortness of breath. No acute events overnight blood pressure soft at 1 point. BUN/creatinine further improving  Assessment & Plan:  AKI/ATN nonoliguric: Baseline renal function normal around 0.5 recently.  Creatinine peaked to 5.4, likely from cisplatin, along with dehydration.Renal ultrasound no obstructive features or hydronephrosis.  UA specific gravity 1.03, WBC 0-5 RBC 0-5.  Hemoglobin holding, patient is on aggressive IV fluid hydration with  improving frenal function, urine output is reassuring  4350 ml/day > 4000 ml/past 24 hrs.Minimize nonessential nephrotoxic medication and keep on ivf to support, BMP in am. Recent Labs  Lab 09/28/20 1337 09/29/20 0030 09/29/20 1113 09/30/20 0704 10/01/20 0448  BUN 59* 58* 59* 60* 44*  CREATININE 5.11* 5.53* 5.43* 4.59* 3.30*   COVID-positive on 1/7 asymptomatic.  Continue on precautions. She has had covid vaccine.  Will order inflammatory markers CRP ferritin procalcitonin.  Hypokalemia potassium 3.1  from her polyuria.  Start potassium supplements and monitor  Intrahepatic cholangiocarcinoma stage IV: Appreciate oncology input, chemo held.   Type 2 diabetes mellitus:Unknown Hemoglobin A1c.Blood sugar stable. Recent Labs  Lab 09/29/20 2107 09/30/20 1656 09/30/20 2118  GLUCAP 104* 99 109*   No results found for: HGBA1C  Microcytic anemia likely chemotherapy-induced along with anemia of chronic disease  and mildly low B12,-supplementation started with 1000 MCG B12, H/H Is downtrending monitor and transfuse less than 7 g. Recent Labs  Lab 09/28/20 0855 09/29/20 0030 09/29/20 1113 09/30/20 0509 10/01/20 0448  HGB 9.5* 8.8* 8.5* 9.2* 7.8*  HCT 29.5* 28.6* 26.4* 28.7* 24.6*   Nutrition: Diet Order            Diet heart healthy/carb modified Room service appropriate? Yes; Fluid consistency: Thin  Diet effective now                Body mass index is 26.05 kg/m. DVT prophylaxis: enoxaparin (LOVENOX) injection 30 mg Start: 09/29/20 1000 Code Status:   Code Status: Full Code  Family Communication: plan of care discussed with patient at bedside.  Status is: Inpatient Remains inpatient appropriate because:IV treatments appropriate due to intensity of illness or inability to take PO and Inpatient level of care appropriate due to severity of illness  Dispo: The patient is from: Home              Anticipated d/c is to: Home              Anticipated d/c date is:1- 2 days              Patient currently is not medically stable to d/c.  Consultants:see note  Procedures:see note  Culture/Microbiology No results found for: SDES, SPECREQUEST, CULT, REPTSTATUS  Other culture-see note  Medications: Scheduled Meds: . docusate sodium  100 mg Oral Daily  . enoxaparin (LOVENOX) injection  30 mg Subcutaneous Q24H  . ferrous sulfate  325 mg Oral BID WC  . insulin aspart  0-5 Units Subcutaneous QHS  . insulin aspart  0-6 Units Subcutaneous BID AC  . vitamin  B-12  1,000 mcg Oral Daily   Continuous Infusions: . lactated ringers 125 mL/hr at 10/01/20 0636    Antimicrobials: Anti-infectives (From admission, onward)   None     Objective: Vitals: Today's Vitals   09/30/20 1939 09/30/20 2159 09/30/20 2342 10/01/20 0501  BP: (!) 143/70  (!) 93/59 (!) 131/53  Pulse: 68  69 61  Resp: 16  16 18   Temp: 98.6 F (37 C)  99.5 F (37.5 C) 98.1 F (36.7 C)  TempSrc:    Oral  SpO2: 99%  98%  100%  Weight:      Height:      PainSc:  0-No pain      Intake/Output Summary (Last 24 hours) at 10/01/2020 0734 Last data filed at 10/01/2020 0636 Gross per 24 hour  Intake 3204.31 ml  Output 4000 ml  Net -795.69 ml   Filed Weights   09/28/20 1549  Weight: 71 kg   Weight change:   Intake/Output from previous day: 01/08 0701 - 01/09 0700 In: 3204.3 [P.O.:480; I.V.:2724.3] Out: 4000 [Urine:4000] Intake/Output this shift: No intake/output data recorded.  Examination: General exam: AAOx3,NAD, weak appearing. HEENT:Oral mucosa moist, Ear/Nose WNL grossly, dentition normal. Respiratory system: bilaterally clear,no wheezing or crackles,no use of accessory muscle Cardiovascular system: S1 & S2 +, No JVD,. Gastrointestinal system: Abdomen soft, NT,ND, BS+ Nervous System:Alert, awake, moving extremities and grossly nonfocal Extremities: No edema, distal peripheral pulses palpable.  Skin: No rashes,no icterus. MSK: Normal muscle bulk,tone, power  Data Reviewed: I have personally reviewed following labs and imaging studies CBC: Recent Labs  Lab 09/28/20 0855 09/29/20 0030 09/29/20 1113 09/30/20 0509 10/01/20 0448  WBC 4.2 4.8 3.8* 5.4 4.8  NEUTROABS 2.8 3.0  --   --   --   HGB 9.5* 8.8* 8.5* 9.2* 7.8*  HCT 29.5* 28.6* 26.4* 28.7* 24.6*  MCV 73.2* 76.5* 74.2* 74.5* 74.1*  PLT 336 249 224 199 [id number]*   Basic Metabolic Panel: Recent Labs  Lab 09/28/20 1337 09/29/20 0030 09/29/20 1113 09/30/20 0704 10/01/20 0448  NA 136 138 140 143 141  K 3.9 3.9 3.8 3.6 3.1*  CL 101 104 106 107 105  CO2 20* 22 23 24 25   GLUCOSE 89 93 128* 103* 107*  BUN 59* 58* 59* 60* 44*  CREATININE 5.11* 5.53* 5.43* 4.59* 3.30*  CALCIUM 7.7* 7.9* 8.0* 8.2* 7.7*  PHOS  --   --  4.5  --   --    GFR: Estimated Creatinine Clearance: 18.4 mL/min (A) (by C-G formula based on SCr of 3.3 mg/dL (H)). Liver Function Tests: Recent Labs  Lab 09/28/20 0855 09/29/20 0030 09/29/20 1113  AST 32 33  --    ALT 33 31  --   ALKPHOS 72 76  --   BILITOT 0.5 0.7  --   PROT 7.4 6.6  --   ALBUMIN 2.9* 2.6* 2.3*   No results for input(s): LIPASE, AMYLASE in the last 168 hours. No results for input(s): AMMONIA in the last 168 hours. Coagulation Profile: No results for input(s): INR, PROTIME in the last 168 hours. Cardiac Enzymes: No results for input(s): CKTOTAL, CKMB, CKMBINDEX, TROPONINI in the last 168 hours. BNP (last 3 results) No results for input(s): PROBNP in the last 8760 hours. HbA1C: No results for input(s): HGBA1C in the last 72 hours. CBG: Recent Labs  Lab 09/29/20 2107 09/30/20 1656 09/30/20 2118  GLUCAP 104* 99 109*   Lipid Profile: No results for input(s): CHOL, HDL, LDLCALC, TRIG, CHOLHDL, LDLDIRECT in the  last 72 hours. Thyroid Function Tests: No results for input(s): TSH, T4TOTAL, FREET4, T3FREE, THYROIDAB in the last 72 hours. Anemia Panel: Recent Labs    09/28/20 2329 09/29/20 0423  VITAMINB12  --  272  FOLATE  --  13.3  FERRITIN  --  796*  TIBC  --  172*  IRON  --  15*  RETICCTPCT <0.4*  --    Sepsis Labs: No results for input(s): PROCALCITON, LATICACIDVEN in the last 168 hours.  Recent Results (from the past 240 hour(s))  SARS CORONAVIRUS 2 (TAT 6-24 HRS) Nasopharyngeal Nasopharyngeal Swab     Status: Abnormal   Collection Time: 09/29/20 12:30 AM   Specimen: Nasopharyngeal Swab  Result Value Ref Range Status   SARS Coronavirus 2 POSITIVE (A) NEGATIVE Final    Comment: (NOTE) SARS-CoV-2 target nucleic acids are DETECTED.  The SARS-CoV-2 RNA is generally detectable in upper and lower respiratory specimens during the acute phase of infection. Positive results are indicative of the presence of SARS-CoV-2 RNA. Clinical correlation with patient history and other diagnostic information is  necessary to determine patient infection status. Positive results do not rule out bacterial infection or co-infection with other viruses.  The expected result is  Negative.  Fact Sheet for Patients: SugarRoll.be  Fact Sheet for Healthcare Providers: https://www.woods-mathews.com/  This test is not yet approved or cleared by the [place] FDA and  has been authorized for detection and/or diagnosis of SARS-CoV-2 by FDA under an Emergency Use Authorization (EUA). This EUA will remain  in effect (meaning this test can be used) for the duration of the COVID-19 declaration under Section 564(b)(1) of the Act, 21 U. S.C. section 360bbb-3(b)(1), unless the authorization is terminated or revoked sooner.   Performed at [facility], [place] [address], [place], [place] [address]      Radiology Studies: No results found.   LOS: 2 days   Antonieta Pert, MD [facility]  10/01/2020, 7:34 AM

## 2020-10-01 NOTE — Evaluation (Signed)
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 Sent to ED due to renal failure. Chemo last wk. COVID + 09/29/20  Clinical Impression  Patient received seated in her bed pleasant and willing to participate with PT today. Patient demonstrated safe bed mobility with no assist.  Completed sit>stand transfer with CGA for safety, pt ambulated 40 ft within her room with mild increase in SOB and fatigue, VCs for pursed lip breathing and posture provided to patient during ambulation.  Completed sit<>stand x10 (pt demonstrated increased SOB and fatigue during activity), completed LAQs and seated marching x10 repetitions each leg with VCs for form and pace. Patient educated on the importance of completing ambulation every hour throughout the day to maintain LE strength and cardiovascular endurance.  Educated patient on the importance of completing pursed lip breathing throughout activity to maintain her SOB. Patient tolerated interventions well today, left seated in her recliner, chair alarm on with call bell in reach.     Follow Up Recommendations No PT follow up          Precautions / Restrictions Precautions Precautions: Fall Restrictions Weight Bearing Restrictions: No      Mobility  Bed Mobility Overal bed mobility: Independent             General bed mobility comments: Completed bed mobility safely without assistance    Transfers Overall transfer level: Needs assistance Equipment used: None             General transfer comment: CGA for safety due to fatigue  Ambulation/Gait Ambulation/Gait assistance: Min guard Gait Distance (Feet): 40 Feet Assistive device: None Gait Pattern/deviations: WFL(Within Functional Limits) Gait velocity: decreased   General Gait Details: Overall normal gait  patter, pt demonstrated decreased velocity and increased fatigue  Stairs            Wheelchair Mobility    Modified Rankin (Stroke Patients Only)       Balance Overall balance assessment: Needs assistance Sitting-balance support: Feet supported Sitting balance-Leahy Scale: Normal Sitting balance - Comments: Pt safe seated EOB with supervision and feet supported   Standing balance support: During functional activity Standing balance-Leahy Scale: Good Standing balance comment: CGA with standing balance for safety purposes                             Pertinent Vitals/Pain Pain Assessment: No/denies pain    Home Living Family/patient expects to be discharged to:: Private residence Living Arrangements: Spouse/significant other Available Help at Discharge: Family Type of Home: Mobile home Home Access: Stairs to enter Entrance Stairs-Rails: Can reach both Entrance Stairs-Number of Steps: 3 steps BIL railing Home Layout: One level Home Equipment: Cane - single point      Prior Function Level of Independence: Independent         Comments: Pt ambulating without AD prior to hospital admission     Hand Dominance   Dominant Hand: Right    Extremity/Trunk Assessment        Lower Extremity Assessment Lower Extremity Assessment: Overall WFL for tasks assessed    Cervical / Trunk Assessment Cervical / Trunk Assessment: Normal  Communication   Communication: No difficulties  Cognition Arousal/Alertness: Awake/alert Behavior During Therapy: WFL for tasks assessed/performed Overall Cognitive Status: Within Functional Limits for tasks assessed  General Comments: Pt alert and oriented x4, pleasant and willing to participate with PT services      General Comments      Exercises Total Joint Exercises Long Arc Quad: AROM;Both;10 reps   Assessment/Plan    PT Assessment Patient needs continued PT  services  PT Problem List Decreased strength;Decreased activity tolerance;Decreased balance       PT Treatment Interventions Gait training;Therapeutic exercise;Balance training;Stair training;Therapeutic activities;Patient/family education    PT Goals (Current goals can be found in the Care Plan section)  Acute Rehab PT Goals Patient Stated Goal: Return home PT Goal Formulation: With patient Time For Goal Achievement: 10/15/20 Potential to Achieve Goals: Good    Frequency Min 2X/week   Barriers to discharge        Co-evaluation               AM-PAC PT "6 Clicks" Mobility  Outcome Measure Help needed turning from your back to your side while in a flat bed without using bedrails?: None Help needed moving from lying on your back to sitting on the side of a flat bed without using bedrails?: None Help needed moving to and from a bed to a chair (including a wheelchair)?: A Little Help needed standing up from a chair using your arms (e.g., wheelchair or bedside chair)?: None Help needed to walk in hospital room?: A Little Help needed climbing 3-5 steps with a railing? : A Little 6 Click Score: 21    End of Session Equipment Utilized During Treatment: Gait belt Activity Tolerance: Patient tolerated treatment well Patient left: with chair alarm set;in chair Nurse Communication: Mobility status PT Visit Diagnosis: Unsteadiness on feet (R26.81);Other abnormalities of gait and mobility (R26.89);Muscle weakness (generalized) (M62.81)    Time: 7681-1572 PT Time Calculation (min) (ACUTE ONLY): 32 min   Charges:   PT Evaluation $PT Eval Low Complexity: 1 Low PT Treatments $Gait Training: 8-22 mins $Therapeutic Activity: 8-22 mins        Duanne Guess, PT, DPT 10/01/20, 4:52 PM   Isaias Cowman 10/01/2020, 4:47 PM

## 2020-10-01 NOTE — Plan of Care (Signed)
Progressing to PLOF 

## 2020-10-01 NOTE — Progress Notes (Signed)
Initial Nutrition Assessment  DOCUMENTATION CODES:   Not applicable  INTERVENTION:  Ensure Enlive po BID, each supplement provides 350 kcal and 20 grams of protein  Education provided  NUTRITION DIAGNOSIS:   Increased nutrient needs related to cancer and cancer related treatments as evidenced by estimated needs.    GOAL:   Patient will meet greater than or equal to 90% of their needs    MONITOR:   Labs,I & O's,Supplement acceptance,PO intake,Weight trends  REASON FOR ASSESSMENT:   Malnutrition Screening Tool    ASSESSMENT:  60 year old female admitted for AKI with past medical history significant of normocytic anemia, DM2, and recently diagnosed cholangiocarcinoma who underwent chemotherapy last week presented after post chemo follow-up labs showed significant increase in creatinine and reports decreased appetite, nausea, and weakness.  RD working remotely.  RD spoke with pt via phone this morning, reports feeling much better today, endorsed 100% of breakfast, recalls eggs, toast, yogurt, fruit, and coffee. Per flowsheet, she ate 90% of lunch and 60% of dinner last night. She reports decreased appetite and weakness since starting chemo.   Patient recalls weighing 200 lbs in 2008, endorses intentional 35 lbs weight loss a few years ago with diet and exercise, reports 165 lb prior to starting chemotherapy. Currently patient weighs 71 kg (156.2 lbs) Per chart, weights have trended down 12 lbs (7.2%) in the last 2 months; significant. Given trends and history significant of cholangiocarcinoma undergoing chemotherapy, highly suspect degree of malnutrition however unable to identify at this time. Will plan to complete exam at follow-up. RD discussed the importance of optimizing nutrition, educated on good sources of lean protein. She is drinking Boost at home, but not every day. RD encouraged small frequent meals/snacks and 1-2 nutrient dense supplements/day. She is agreeable to  drinking strawberry Ensure during admission. She is interested in outpatient nutrition services at cancer center. Will route note to outpatient RD for continued nutrition care.   I/Os: -36.5 ml since admit UOP: 4000 ml x 24 hrs  Medications reviewed and include: Colace, Ferrous sulfate, SSI, Klor-con, B12  LR @ 125 ml/hr  Labs: CBGs 76,109,99, K 3.1 (L), BUN 44 (H) trending down, Cr 3.30 (H) trending down, Hgb 7.8 (L), HCT 24.6 (L)  NUTRITION - FOCUSED PHYSICAL EXAM: Unable to complete at this time   Diet Order:   Diet Order            Diet heart healthy/carb modified Room service appropriate? Yes; Fluid consistency: Thin  Diet effective now                 EDUCATION NEEDS:   Education needs have been addressed  Skin:  Skin Assessment: Reviewed RN Assessment  Last BM:  1/8  Height:   Ht Readings from Last 1 Encounters:  09/28/20 5\' 5"  (1.651 m)    Weight:   Wt Readings from Last 1 Encounters:  09/28/20 71 kg    BMI:  Body mass index is 26.05 kg/m.  Estimated Nutritional Needs:   Kcal:  2000-2200  Protein:  95-110  Fluid:  >2L   Lajuan Lines, RD, LDN Clinical Nutrition After Hours/Weekend Pager # in [place]

## 2020-10-02 ENCOUNTER — Telehealth: Payer: Self-pay | Admitting: *Deleted

## 2020-10-02 DIAGNOSIS — N179 Acute kidney failure, unspecified: Secondary | ICD-10-CM | POA: Diagnosis not present

## 2020-10-02 LAB — BASIC METABOLIC PANEL
Anion gap: 12 (ref 5–15)
Anion gap: 12 (ref 5–15)
BUN: 35 mg/dL — ABNORMAL HIGH (ref 6–20)
BUN: 29 mg/dL — ABNORMAL HIGH (ref 6–20)
CO2: 27 mmol/L (ref 22–32)
CO2: 28 mmol/L (ref 22–32)
Calcium: 8 mg/dL — ABNORMAL LOW (ref 8.9–10.3)
Calcium: 8.1 mg/dL — ABNORMAL LOW (ref 8.9–10.3)
Chloride: 105 mmol/L (ref 98–111)
Chloride: 104 mmol/L (ref 98–111)
Creatinine, Ser: 2.38 mg/dL — ABNORMAL HIGH (ref 0.44–1.00)
Creatinine, Ser: 2.1 mg/dL — ABNORMAL HIGH (ref 0.44–1.00)
GFR, Estimated: 23 mL/min — ABNORMAL LOW (ref >60)
GFR, Estimated: 27 mL/min — ABNORMAL LOW (ref >60)
Glucose, Bld: 106 mg/dL — ABNORMAL HIGH (ref 70–99)
Glucose, Bld: 127 mg/dL — ABNORMAL HIGH (ref 70–99)
Potassium: 3.4 mmol/L — ABNORMAL LOW (ref 3.5–5.1)
Potassium: 3.9 mmol/L (ref 3.5–5.1)
Sodium: 144 mmol/L (ref 135–145)
Sodium: 144 mmol/L (ref 135–145)

## 2020-10-02 LAB — CBC
HCT: 26.3 % — ABNORMAL LOW (ref 36.0–46.0)
Hemoglobin: 8.5 g/dL — ABNORMAL LOW (ref 12.0–15.0)
MCH: 23.9 pg — ABNORMAL LOW (ref 26.0–34.0)
MCHC: 32.3 g/dL (ref 30.0–36.0)
MCV: 74.1 fL — ABNORMAL LOW (ref 80.0–100.0)
Platelets: 158 10*3/uL (ref 150–400)
RBC: 3.55 MIL/uL — ABNORMAL LOW (ref 3.87–5.11)
RDW: 17.5 % — ABNORMAL HIGH (ref 11.5–15.5)
WBC: 5.3 10*3/uL (ref 4.0–10.5)
nRBC: 0 % (ref 0.0–0.2)

## 2020-10-02 LAB — GLUCOSE, CAPILLARY
Glucose-Capillary: 89 mg/dL (ref 70–99)
Glucose-Capillary: 118 mg/dL — ABNORMAL HIGH (ref 70–99)
Glucose-Capillary: 95 mg/dL (ref 70–99)

## 2020-10-02 LAB — PROCALCITONIN: Procalcitonin: 0.22 ng/mL

## 2020-10-02 LAB — C-REACTIVE PROTEIN: CRP: 12.2 mg/dL — ABNORMAL HIGH (ref <1.0)

## 2020-10-02 MED ORDER — SODIUM CHLORIDE 0.9 % IV SOLN: 100.0000 mg | Freq: Every day | INTRAVENOUS | Status: DC

## 2020-10-02 MED ORDER — FERROUS SULFATE 325 (65 FE) MG PO TABS: 325.0000 mg | ORAL_TABLET | Freq: Two times a day (BID) | ORAL | 0 refills | Status: DC

## 2020-10-02 MED ORDER — SODIUM CHLORIDE 0.9 % IV SOLN
200.0000 mg | Freq: Once | INTRAVENOUS | Status: AC
  Administered 2020-10-02: 15:00:00 200 mg via INTRAVENOUS
  Filled 2020-10-02: qty 200

## 2020-10-02 MED ORDER — CYANOCOBALAMIN 1000 MCG PO TABS
1000.0000 ug | ORAL_TABLET | Freq: Every day | ORAL | 0 refills | Status: AC
Start: 2020-10-03 — End: 2020-11-02

## 2020-10-02 MED ORDER — POTASSIUM CHLORIDE CRYS ER 20 MEQ PO TBCR
20.0000 meq | EXTENDED_RELEASE_TABLET | Freq: Once | ORAL | Status: AC
  Administered 2020-10-02: 20 meq via ORAL

## 2020-10-02 NOTE — Progress Notes (Signed)
IV removed before discharge. Went over discharge instructions with patient. She stated that she understood. Patients blood pressure was elevated just before she left and she stated that is was up because she was so excited that she was going home. Patient going home POV with husband.

## 2020-10-02 NOTE — TOC Initial Note (Signed)
Transition of Care [facility]) - Initial/Assessment Note    Patient Details  Name: Lauren Lindsey MRN: [id number] Date of Birth: 1961-04-18  Transition of Care [facility] CM/SW Contact:    Shelbie Hutching, RN Phone Number: 10/02/2020, 3:45 PM  Clinical Narrative:                 Patient admitted to the hospital with [place].  Patient is medically cleared for discharge home today.  Patient will need 2 more doses of remdesivir outpatient.  Patient is scheduled for infusion tomorrow and 1/12 at 1:30 pm.  Patient's husband will be able to drive her to infusion.  RNCM printed off driving directions to be put in patient's discharge packet for the husband.  Husband will be picking patient up this afternoon to go home.   Expected Discharge Plan: Home/Self Care Barriers to Discharge: No Barriers Identified   Patient Goals and CMS Choice Patient states their goals for this hospitalization and ongoing recovery are:: Glad to be going home      Expected Discharge Plan and Services Expected Discharge Plan: Home/Self Care       Living arrangements for the past 2 months: Single Family Home Expected Discharge Date: 10/02/20               DME Arranged: N/A DME Agency: NA       HH Arranged: NA          Prior Living Arrangements/Services Living arrangements for the past 2 months: Single Family Home Lives with:: Spouse Patient language and need for interpreter reviewed:: Yes Do you feel safe going back to the place where you live?: Yes      Need for Family Participation in Patient Care: Yes (Comment) (COVID) Care giver support system in place?: Yes (comment) (husband)   Criminal Activity/Legal Involvement Pertinent to Current Situation/Hospitalization: No - Comment as needed  Activities of Daily Living Home Assistive Devices/Equipment: Scales,CBG Meter,Blood pressure cuff,Eyeglasses,Reacher ADL Screening (condition at time of admission) Patient's cognitive ability adequate to safely complete daily  activities?: Yes Is the patient deaf or have difficulty hearing?: No Does the patient have difficulty seeing, even when wearing glasses/contacts?: Yes Does the patient have difficulty concentrating, remembering, or making decisions?: No Patient able to express need for assistance with ADLs?: Yes Does the patient have difficulty dressing or bathing?: No Independently performs ADLs?: Yes (appropriate for developmental age) Does the patient have difficulty walking or climbing stairs?: No Weakness of Legs: None Weakness of Arms/Hands: None  Permission Sought/Granted Permission sought to share information with : Case Manager,Family Supports Permission granted to share information with : Yes, Verbal Permission Granted  Share Information with NAME: Mortimer Fries     Permission granted to share info w Relationship: husband     Emotional Assessment Appearance:: Appears stated age Attitude/Demeanor/Rapport: Engaged Affect (typically observed): Accepting Orientation: : Oriented to Self,Oriented to Place,Oriented to  Time,Oriented to Situation Alcohol / Substance Use: Not Applicable Psych Involvement: No (comment)  Admission diagnosis:  Cancer ([place]) [C80.1] Acute renal failure (ARF) ([facility]) [N17.9] AKI (acute kidney injury) ([place]) [N17.9] Patient Active Problem List   Diagnosis Date Noted  . AKI (acute kidney injury) ([place]) 09/29/2020  . Type 2 diabetes mellitus ([place]) 09/29/2020  . Normocytic anemia 09/29/2020  . Microcytic anemia 09/29/2020  . Acute renal failure (ARF) ([place]) 09/29/2020  . Goals of care, counseling/discussion 08/04/2020  . Status post laparoscopic cholecystectomy 06/08/2020  . Intrahepatic cholangiocarcinoma ([place]) 05/31/2020  . 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 Chronic  venous insufficiency 08/05/2016  . Diabetes mellitus type 2, uncomplicated ([place]) 30/03/6225   PCP:  [name], MD Pharmacy:   [id number] - [place], [place] [address] Phone: [phone] Fax: [phone]     Social Determinants of Health (SDOH) Interventions    Readmission Risk Interventions No flowsheet data found.

## 2020-10-02 NOTE — Progress Notes (Signed)
Patient scheduled for outpatient Remdesivir infusions at 1:30 on Tuesday 1/11 and Wednesday 1/12 at [facility]. Please inform the patient to park at [address], [place], as staff will be escorting the patient through the east entrance of the hospital. Appointments take approximately 45 minutes.    There is a wave flag banner located near the entrance on [address] Turn into this entrance and immediately turn left or right and park in 1 of the 10 designated Covid Infusion Parking spots. There is a phone number on the sign, please call and let the staff know what spot you are in and we will come out and get you. For questions call [phone].  Thanks. 

## 2020-10-02 NOTE — Telephone Encounter (Signed)
Lauren Lindsey called requesting a return call regarding patient being positive for COVID. I attempted to call him, but he has no voice mail

## 2020-10-02 NOTE — Telephone Encounter (Signed)
Husband called again an dsaid patient is being discharged today and is asking what think about that and what to do about her chemotherapy tcx. Please advise

## 2020-10-02 NOTE — Discharge Instructions (Signed)
Patient scheduled for outpatient Remdesivir infusions at 1:30 on Tuesday 1/11 and Wednesday 1/12 at [facility]. Please inform the patient to park at [place], as staff will be escorting the patient through the [place] entrance of the hospital. Appointments take approximately 45 minutes.    There is a wave flag banner located near the entrance on [address]. Turn into this entrance and immediately turn left or right and park in 1 of the 10 designated Covid Infusion Parking spots. There is a phone number on the sign, please call and let the staff know what spot you are in and we will come out and get you. For questions call [phone].  Thanks.

## 2020-10-02 NOTE — Telephone Encounter (Signed)
Was planing on seeing her today, but she is in the covid "hot" unit in the hospital.  Can we do a video visit this week?

## 2020-10-02 NOTE — Discharge Summary (Signed)
Physician Discharge Summary  Lauren Lindsey [id number] DOB: 1960-11-13 DOA: 09/29/2020  PCP: [name], MD  Admit date: 09/29/2020 Discharge date: 10/02/2020  Admitted From: home Disposition:  home  Recommendations for Outpatient Follow-up:  1. Follow up with PCP in 1-2 weeks 2. Please obtain BMP/CBC in one week 3. Please follow up on the following pending results:  Home Health:no  Equipment/Devices: none  Discharge Condition: Stable Code Status:   Code Status: Full Code Diet recommendation:  Diet Order            Diet - low sodium heart healthy           Diet heart healthy/carb modified Room service appropriate? 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 Patient was admitted with AKI/ATN and  IV fluid hydration with improving creatinine.  Renal ultrasound no evidence of obstructive uropathy. Kidney functions improving with IV fluid hydration.  She tested positive for COVID-19 but asymptomatic, has had vaccination for covid. Patient renal function has improved creatinine at two-point, she has been taking oral well. Given her high risk in the setting of malignancy recent chemo patient given remdesivir x1 in-house and will get 2 more doses from outpatient infusion center at [facility] which has already been scheduled. She is alert awake oriented she is wanting to go home today.   Discharge Diagnoses:  AKI/ATN nonoliguric: Baseline renal function normal around 0.5  And creatinine peaked to 5.4, likely from cisplatin/ chemo, along with dehydration.Renal ultrasound no obstructive features or hydronephrosis.  UA specific gravity 1.03, WBC 0-5 RBC 0-5, protein 30. UOP VS were reviewed.creatinine nicely improved 2.1, continue oral hydration at home follow-up with PCP to check renal function at end  of this week.  Recent Labs  Lab 09/29/20 1113 09/30/20 0704 10/01/20 0448 10/02/20 0326 10/02/20 1240  BUN 59* 60* 44* 35* 29*  CREATININE 5.43* 4.59* 3.30* 2.38* 2.10*   COVID-positive on 1/7 asymptomatic, no respiratory symptoms no chest pain. crp at 12 given her high risk feature malignancy recent chemo she will be given remdesivirx 3 doses, received 1 dose today and 2 more doses will be at outpatient infusion center at [facility]. She has had covid vaccine.Procalcitonin 0.2. COVID-19 Labs  Recent Labs    10/01/20 0932 10/02/20 0326  FERRITIN 794*  --   CRP 12.9* 12.2*   Lab Results  Component Value Date   SARSCOV2NAA POSITIVE (A) 09/29/2020   [place] NEGATIVE 06/13/2020   [place] NEGATIVE 05/30/2020   [place] NEGATIVE 05/23/2020   Hypokalemia  Resolved  Intrahepatic cholangiocarcinoma stage IV: Appreciate oncology input, chemo held.  Advise outpatient follow-up once and function stabilizes to discuss further  Type 2 diabetes mellitus:Unknown Hemoglobin A1c blood sugar is well controlled. 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 Recent Labs  Lab 09/29/20 0030 09/29/20 1113 09/30/20 0509 10/01/20 0448 10/02/20 0326  HGB 8.8* 8.5* 9.2* 7.8* 8.5*  HCT 28.6* 26.4* 28.7* 24.6* 26.3*   Consults: Hem/onc  Subjective: Aaox3, no chest pain no cough no shortness of breath.  Requesting to be discharged home today as she feels fine.  Discharge Exam: Vitals:   10/02/20 0357 10/02/20 0808  BP: 138/60 132/80  Pulse: 60 68  Resp: 18 16  Temp: 98.9 F (37.2 C) 98.1 F (36.7 C)  SpO2: 98% 99%   General: Pt is alert, awake, not in acute distress Cardiovascular: RRR, S1/S2 +, no rubs, no  gallops Respiratory: CTA bilaterally, no wheezing, no rhonchi Abdominal: Soft, NT, ND, bowel sounds + Extremities: no edema, no cyanosis  Discharge Instructions  Discharge Instructions    Diet - low sodium heart healthy   Complete by: As directed    Discharge instructions   Complete by: As directed    Please call call MD or return to ER for similar or worsening recurring problem that brought you to hospital or if any fever,nausea/vomiting,abdominal pain, uncontrolled pain, chest pain,  shortness of breath or any other alarming symptoms.  Please follow-up your doctor as instructed w/i a week to check renal function and call the office for appointment.  PLEASE STAY IN ISOLATION FOR 10 DAYS since covid test ( 09/29/20)  Directions for you at home:  Wear a facemask - For the next 2 weeks  You should wear a facemask that covers your nose and mouth when you are in the same room with other people and when you visit a healthcare provider. People who live with or visit you should also wear a facemask while they are in the same room with you.  Separate yourself from other people in your home As much as possible, you should stay in a different room from other people in your home. Also, you should use a separate bathroom, if available.  Avoid sharing household items You should not share dishes, drinking glasses, cups, eating utensils, towels, bedding, or other items with other people in your home. After using these items, you should wash them thoroughly with soap and water.  Cover your coughs and sneezes Cover your mouth and nose with a tissue when you cough or sneeze, or you can cough or sneeze into your sleeve. Throw used tissues in a lined trash can, and immediately wash your hands with soap and water for at least 20 seconds or use an alcohol-based hand rub.  Wash your [facility] your hands often and thoroughly with soap and water for at least 20 seconds. You can use an alcohol-based  hand sanitizer if soap and water are not available and if your hands are not visibly dirty. Avoid touching your eyes, nose, and mouth with unwashed hands.  Directions for those who live with, or provide care at home for you:  Limit the number of people who have contact with the patient If possible, have only one caregiver for the patient. Other household members should stay in another home or place of residence. If this is not possible, they should stay in another room, or be separated from the patient as much as possible. Use a separate bathroom, if available. Restrict visitors who do not have an essential need to be in the home.  Ensure good ventilation Make sure that shared spaces in the home have good air flow, such as from an air conditioner or an opened window, weather permitting.  Wash your hands often Wash your hands often and thoroughly with soap and water for at least 20 seconds. You can use an alcohol based hand sanitizer if soap and water are not available and if your hands are not visibly dirty. Avoid touching your  eyes, nose, and mouth with unwashed hands. Use disposable paper towels to dry your hands. If not available, use dedicated cloth towels and replace them when they become wet.  Wear a facemask and gloves Wear a disposable facemask at all times in the room and gloves when you touch or have contact with the patient's blood, body fluids, and/or secretions or excretions, such as sweat, saliva, sputum, nasal mucus, vomit, urine, or feces.  Ensure the mask fits over your nose and mouth tightly, and do not touch it during use. Throw out disposable facemasks and gloves after using them. Do not reuse. Wash your hands immediately after removing your facemask and gloves. If your personal clothing becomes contaminated, carefully remove clothing and launder. Wash your hands after handling contaminated clothing. Place all used disposable facemasks, gloves, and other waste in a lined  container before disposing them with other household waste. Remove gloves and wash your hands immediately after handling these items.  Do not share dishes, glasses, or other household items with the patient Avoid sharing household items. You should not share dishes, drinking glasses, cups, eating utensils, towels, bedding, or other items with a patient who is confirmed to have, or being evaluated for, COVID-19 infection. After the person uses these items, you should wash them thoroughly with soap and water.  Wash laundry thoroughly Immediately remove and wash clothes or bedding that have blood, body fluids, and/or secretions or excretions, such as sweat, saliva, sputum, nasal mucus, vomit, urine, or feces, on them. Wear gloves when handling laundry from the patient. Read and follow directions on labels of laundry or clothing items and detergent. In general, wash and dry with the warmest temperatures recommended on the label.  Clean all areas the individual has used often Clean all touchable surfaces, such as counters, tabletops, doorknobs, bathroom fixtures, toilets, phones, keyboards, tablets, and bedside tables, every day. Also, clean any surfaces that may have blood, body fluids, and/or secretions or excretions on them. Wear gloves when cleaning surfaces the patient has come in contact with. Use a diluted bleach solution (e.g., dilute bleach with 1 part bleach and 10 parts water) or a household disinfectant with a label that says EPA-registered for coronaviruses. To make a bleach solution at home, add 1 tablespoon of bleach to 1 quart (4 cups) of water. For a larger supply, add  cup of bleach to 1 gallon (16 cups) of water. Read labels of cleaning products and follow recommendations provided on product labels. Labels contain instructions for safe and effective use of the cleaning product including precautions you should take when applying the product, such as wearing gloves or eye protection and  making sure you have good ventilation during use of the product. Remove gloves and wash hands immediately after cleaning.  Monitor yourself for signs and symptoms of illness Caregivers and household members are considered close contacts, should monitor their health, and will be asked to limit movement outside of the home to the extent possible. Follow the monitoring steps for close contacts listed on the symptom monitoring form.     Please avoid alcohol, smoking, or any other illicit substance and maintain healthy habits including taking your regular medications as prescribed.  You were cared for by a hospitalist during your hospital stay. If you have any questions about your discharge medications or the care you received while you were in the hospital after you are discharged, you can call the unit and ask to speak with the hospitalist on call if the hospitalist that  took care of you is not available.  Once you are discharged, your primary care physician will handle any further medical issues. Please note that NO REFILLS for any discharge medications will be authorized once you are discharged, as it is imperative that you return to your primary care physician (or establish a relationship with a primary care physician if you do not have one) for your aftercare needs so that they can reassess your need for medications and monitor your lab values   Increase activity slowly   Complete by: As directed      Allergies as of 10/02/2020   No Known Allergies     Medication List    STOP taking these medications   metFORMIN 500 MG tablet Commonly known as: GLUCOPHAGE     TAKE these medications   acetaminophen 500 MG tablet Commonly known as: TYLENOL Take 500 mg by mouth every 6 (six) hours as needed.   cyanocobalamin 1000 MCG tablet Take 1 tablet (1,000 mcg total) by mouth daily. Start taking on: October 03, 2020   ferrous sulfate 325 (65 FE) MG tablet Take 1 tablet (325 mg total) by  mouth 2 (two) times daily with a meal.   HYDROcodone-acetaminophen 5-325 MG tablet Commonly known as: NORCO/VICODIN Take 1 tablet by mouth every 4 (four) hours as needed for moderate pain.   ondansetron 8 MG tablet Commonly known as: ZOFRAN Take 1 tablet (8 mg total) by mouth every 8 (eight) hours as needed for nausea or vomiting.   prochlorperazine 10 MG tablet Commonly known as: COMPAZINE Take 1 tablet (10 mg total) by mouth every 6 (six) hours as needed for nausea or vomiting.       Follow-up Information    [name], MD Follow up in 4 day(s).   Specialty: Family Medicine Why: for bmp check Contact information: [place] [address] [phone]              No Known Allergies  The results of significant diagnostics from this hospitalization (including imaging, microbiology, ancillary and laboratory) are listed below for reference.    Microbiology: Recent Results (from the past 240 hour(s))  SARS CORONAVIRUS 2 (TAT 6-24 HRS) Nasopharyngeal Nasopharyngeal Swab     Status: Abnormal   Collection Time: 09/29/20 12:30 AM   Specimen: Nasopharyngeal Swab  Result Value Ref Range Status   SARS Coronavirus 2 POSITIVE (A) NEGATIVE Final    Comment: (NOTE) SARS-CoV-2 target nucleic acids are DETECTED.  The SARS-CoV-2 RNA is generally detectable in upper and lower respiratory specimens during the acute phase of infection. Positive results are indicative of the presence of SARS-CoV-2 RNA. Clinical correlation with patient history and other diagnostic information is  necessary to determine patient infection status. Positive results do not rule out bacterial infection or co-infection with other viruses.  The expected result is Negative.  Fact Sheet for Patients: SugarRoll.be  Fact Sheet for Healthcare Providers: https://www.woods-mathews.com/  This test is not yet  approved or cleared by the [place] FDA and  has been authorized for detection and/or diagnosis of SARS-CoV-2 by FDA under an Emergency Use Authorization (EUA). This EUA will remain  in effect (meaning this test can be used) for the duration of the COVID-19 declaration under Section 564(b)(1) of the Act, 21 U. S.C. section 360bbb-3(b)(1), unless the authorization is terminated or revoked sooner.   Performed at [facility], [place] [address], [place], [place] [address]  Procedures/Studies: US Renal  Result Date: 09/29/2020 CLINICAL DATA:  Acute renal insufficiency EXAM: RENAL / URINARY TRACT ULTRASOUND COMPLETE COMPARISON:  09/01/2020, CT 06/16/2020 FINDINGS: Right Kidney: Renal measurements: 14.4 x 6.0 x 6.2 cm = volume: 277 mL. Renal cortical thickness and cortical echogenicity are normal. No intrarenal masses or calcifications are seen. There is mild distension of the renal pelvis without associated caliectasis. Left Kidney: Renal measurements: 13.2 x 7.4 x 6.9 cm = volume: 351 mL. Renal cortical thickness and cortical echogenicity are normal. No intrarenal masses or calcifications are seen. There is mild distension of the renal pelvis without associated caliectasis. Bladder: The bladder is mildly distended. No intraluminal masses or calcifications are seen. Bilateral ureteral jets are identified. Other: None. IMPRESSION: Mild distention of the renal pelves bilaterally possibly related to urinary retention. No associated caliectasis. Otherwise normal renal sonogram. Electronically Signed   By: Fidela Salisbury MD   On: 09/29/2020 01:46    Labs: BNP (last 3 results) No results for input(s): BNP in the last 8760 hours. Basic Metabolic Panel: Recent Labs  Lab 09/29/20 1113 09/30/20 0704 10/01/20 0448 10/02/20 0326 10/02/20 1240  NA 140 143 141 144 144  K 3.8 3.6 3.1* 3.4* 3.9  CL 106 107 105 105 104  CO2 23 24 25 27 28   GLUCOSE 128* 103* 107* 106* 127*  BUN 59* 60* 44* 35*  29*  CREATININE 5.43* 4.59* 3.30* 2.38* 2.10*  CALCIUM 8.0* 8.2* 7.7* 8.0* 8.1*  PHOS 4.5  --   --   --   --    Liver Function Tests: Recent Labs  Lab 09/28/20 0855 09/29/20 0030 09/29/20 1113  AST 32 33  --   ALT 33 31  --   ALKPHOS 72 76  --   BILITOT 0.5 0.7  --   PROT 7.4 6.6  --   ALBUMIN 2.9* 2.6* 2.3*   No results for input(s): LIPASE, AMYLASE in the last 168 hours. No results for input(s): AMMONIA in the last 168 hours. CBC: Recent Labs  Lab 09/28/20 0855 09/29/20 0030 09/29/20 1113 09/30/20 0509 10/01/20 0448 10/02/20 0326  WBC 4.2 4.8 3.8* 5.4 4.8 5.3  NEUTROABS 2.8 3.0  --   --   --   --   HGB 9.5* 8.8* 8.5* 9.2* 7.8* 8.5*  HCT 29.5* 28.6* 26.4* 28.7* 24.6* 26.3*  MCV 73.2* 76.5* 74.2* 74.5* 74.1* 74.1*  PLT 336 249 224 199 139* 158   Cardiac Enzymes: No results for input(s): CKTOTAL, CKMB, CKMBINDEX, TROPONINI in the last 168 hours. BNP: Invalid input(s): POCBNP CBG: Recent Labs  Lab 10/01/20 1227 10/01/20 1659 10/01/20 2147 10/02/20 0737 10/02/20 1121  GLUCAP 84 143* 97 89 118*   D-Dimer No results for input(s): DDIMER in the last 72 hours. Hgb A1c No results for input(s): HGBA1C in the last 72 hours. Lipid Profile No results for input(s): CHOL, HDL, LDLCALC, TRIG, CHOLHDL, LDLDIRECT in the last 72 hours. Thyroid function studies No results for input(s): TSH, T4TOTAL, T3FREE, THYROIDAB in the last 72 hours.  Invalid input(s): FREET3 Anemia work up Recent Labs    10/01/20 0932  FERRITIN 794*   Urinalysis    Component Value Date/Time   COLORURINE STRAW (A) 09/29/2020 0210   APPEARANCEUR CLEAR (A) 09/29/2020 0210   LABSPEC 1.003 (L) 09/29/2020 0210   PHURINE 6.0 09/29/2020 0210   GLUCOSEU NEGATIVE 09/29/2020 0210   HGBUR SMALL (A) 09/29/2020 0210   BILIRUBINUR NEGATIVE 09/29/2020 0210   KETONESUR NEGATIVE 09/29/2020 0210   PROTEINUR  30 (A) 09/29/2020 0210   NITRITE NEGATIVE 09/29/2020 0210   LEUKOCYTESUR NEGATIVE 09/29/2020 0210    Sepsis Labs Invalid input(s): PROCALCITONIN,  WBC,  LACTICIDVEN Microbiology Recent Results (from the past 240 hour(s))  SARS CORONAVIRUS 2 (TAT 6-24 HRS) Nasopharyngeal Nasopharyngeal Swab     Status: Abnormal   Collection Time: 09/29/20 12:30 AM   Specimen: Nasopharyngeal Swab  Result Value Ref Range Status   SARS Coronavirus 2 POSITIVE (A) NEGATIVE Final    Comment: (NOTE) SARS-CoV-2 target nucleic acids are DETECTED.  The SARS-CoV-2 RNA is generally detectable in upper and lower respiratory specimens during the acute phase of infection. Positive results are indicative of the presence of SARS-CoV-2 RNA. Clinical correlation with patient history and other diagnostic information is  necessary to determine patient infection status. Positive results do not rule out bacterial infection or co-infection with other viruses.  The expected result is Negative.  Fact Sheet for Patients: SugarRoll.be  Fact Sheet for Healthcare Providers: https://www.woods-mathews.com/  This test is not yet approved or cleared by the [place] FDA and  has been authorized for detection and/or diagnosis of SARS-CoV-2 by FDA under an Emergency Use Authorization (EUA). This EUA will remain  in effect (meaning this test can be used) for the duration of the COVID-19 declaration under Section 564(b)(1) of the Act, 21 U. S.C. section 360bbb-3(b)(1), unless the authorization is terminated or revoked sooner.   Performed at [facility], [place] [address] [place], [place] [address]      Time coordinating discharge: 35 minutes  SIGNED: Antonieta Pert, MD  [facility] 10/02/2020, 2:50 PM  If 7PM-7AM, please contact night-coverage [email or web address]

## 2020-10-03 ENCOUNTER — Ambulatory Visit (HOSPITAL_COMMUNITY)
Admit: 2020-10-03 | Discharge: 2020-10-03 | Disposition: A | Discharge status: Home / Self Care | Payer: BC Managed Care – PPO | Attending: Pulmonary Disease | Admitting: Pulmonary Disease

## 2020-10-03 DIAGNOSIS — U071 COVID-19: Secondary | ICD-10-CM | POA: Diagnosis present

## 2020-10-03 MED ORDER — FAMOTIDINE IN NACL 20-0.9 MG/50ML-% IV SOLN: 20.0000 mg | Freq: Once | INTRAVENOUS | Status: DC | PRN

## 2020-10-03 MED ORDER — EPINEPHRINE 0.3 MG/0.3ML IJ SOAJ: 0.3000 mg | Freq: Once | INTRAMUSCULAR | Status: DC | PRN

## 2020-10-03 MED ORDER — SODIUM CHLORIDE 0.9 % IV SOLN: INTRAVENOUS | Status: DC | PRN

## 2020-10-03 MED ORDER — DIPHENHYDRAMINE HCL 50 MG/ML IJ SOLN: 50.0000 mg | Freq: Once | INTRAMUSCULAR | Status: DC | PRN

## 2020-10-03 MED ORDER — ALBUTEROL SULFATE HFA 108 (90 BASE) MCG/ACT IN AERS: 2.0000 | INHALATION_SPRAY | Freq: Once | RESPIRATORY_TRACT | Status: DC | PRN

## 2020-10-03 MED ORDER — SODIUM CHLORIDE 0.9 % IV SOLN: 100.0000 mg | Freq: Once | INTRAVENOUS | Status: DC

## 2020-10-03 MED ORDER — SODIUM CHLORIDE 0.9 % IV SOLN
100.0000 mg | Freq: Once | INTRAVENOUS | Status: AC
  Administered 2020-10-03: 100 mg via INTRAVENOUS

## 2020-10-03 MED ORDER — METHYLPREDNISOLONE SODIUM SUCC 125 MG IJ SOLR: 125.0000 mg | Freq: Once | INTRAMUSCULAR | Status: DC | PRN

## 2020-10-03 NOTE — Discharge Instructions (Signed)
If you have any questions or concerns after the infusion please call the Advanced Practice Provider on call at [phone]. This number is ONLY intended for your use regarding questions or concerns about the infusion post-treatment side-effects.  Please do not provide this number to others for use. For return to work notes please contact your primary care provider.   If someone you know is interested in receiving treatment please have them call the COVID hotline at [phone].    

## 2020-10-03 NOTE — Progress Notes (Signed)
  Diagnosis: COVID-19  Physician: Dr. Wright   Procedure: [facility] Infusion [facility] Med: remdesivir infusion - Provided patient with remdesivir fact sheet for patients, parents and caregivers prior to infusion.  Complications: No immediate complications noted.  Discharge: Discharged home   Lauren Lindsey  B Lauren Lindsey 10/03/2020   

## 2020-10-04 ENCOUNTER — Inpatient Hospital Stay (HOSPITAL_BASED_OUTPATIENT_CLINIC_OR_DEPARTMENT_OTHER): Payer: BC Managed Care – PPO | Admitting: Oncology

## 2020-10-04 ENCOUNTER — Ambulatory Visit (HOSPITAL_COMMUNITY)
Admit: 2020-10-04 | Discharge: 2020-10-04 | Disposition: A | Discharge status: Home / Self Care | Payer: BC Managed Care – PPO | Attending: Pulmonary Disease | Admitting: Pulmonary Disease

## 2020-10-04 ENCOUNTER — Other Ambulatory Visit: Payer: Self-pay

## 2020-10-04 DIAGNOSIS — U071 COVID-19: Secondary | ICD-10-CM | POA: Diagnosis not present

## 2020-10-04 DIAGNOSIS — J1282 Pneumonia due to coronavirus disease 2019: Secondary | ICD-10-CM | POA: Insufficient documentation

## 2020-10-04 DIAGNOSIS — C220 Liver cell carcinoma: Secondary | ICD-10-CM | POA: Diagnosis not present

## 2020-10-04 MED ORDER — DIPHENHYDRAMINE HCL 50 MG/ML IJ SOLN: 50.0000 mg | Freq: Once | INTRAMUSCULAR | Status: DC | PRN

## 2020-10-04 MED ORDER — SODIUM CHLORIDE 0.9 % IV SOLN: INTRAVENOUS | Status: DC | PRN

## 2020-10-04 MED ORDER — EPINEPHRINE 0.3 MG/0.3ML IJ SOAJ: 0.3000 mg | Freq: Once | INTRAMUSCULAR | Status: DC | PRN

## 2020-10-04 MED ORDER — ALBUTEROL SULFATE HFA 108 (90 BASE) MCG/ACT IN AERS: 2.0000 | INHALATION_SPRAY | Freq: Once | RESPIRATORY_TRACT | Status: DC | PRN

## 2020-10-04 MED ORDER — FAMOTIDINE IN NACL 20-0.9 MG/50ML-% IV SOLN: 20.0000 mg | Freq: Once | INTRAVENOUS | Status: DC | PRN

## 2020-10-04 MED ORDER — SODIUM CHLORIDE 0.9 % IV SOLN
100.0000 mg | Freq: Once | INTRAVENOUS | Status: AC
  Administered 2020-10-04: 100 mg via INTRAVENOUS

## 2020-10-04 MED ORDER — METHYLPREDNISOLONE SODIUM SUCC 125 MG IJ SOLR: 125.0000 mg | Freq: Once | INTRAMUSCULAR | Status: DC | PRN

## 2020-10-04 NOTE — Progress Notes (Signed)
  Diagnosis: COVID-19  Physician: Dr. Wright  Procedure: [facility] Infusion Clinic Med: remdesivir infusion - Provided patient with remdesivir fact sheet for patients, parents and caregivers prior to infusion.  Complications: No immediate complications noted.  Discharge: Discharged home   Lauren Lindsey 10/04/2020   

## 2020-10-04 NOTE — Progress Notes (Signed)
Patient denies any concerns today.  

## 2020-10-04 NOTE — Discharge Instructions (Signed)
10 Things You Can Do to Manage Your COVID-19 Symptoms at Home °If you have possible or confirmed COVID-19: °1. Stay home except to get medical care. °2. Monitor your symptoms carefully. If your symptoms get worse, call your healthcare provider immediately. °3. Get rest and stay hydrated. °4. If you have a medical appointment, call the healthcare provider ahead of time and tell them that you have or may have COVID-19. °5. For medical emergencies, call 911 and notify the dispatch personnel that you have or may have COVID-19. °6. Cover your cough and sneezes with a tissue or use the inside of your elbow. °7. Wash your hands often with soap and water for at least 20 seconds or clean your hands with an alcohol-based hand sanitizer that contains at least 60% alcohol. °8. As much as possible, stay in a specific room and away from other people in your home. Also, you should use a separate bathroom, if available. If you need to be around other people in or outside of the home, wear a mask. °9. Avoid sharing personal items with other people in your household, like dishes, towels, and bedding. °10. Clean all surfaces that are touched often, like counters, tabletops, and doorknobs. Use household cleaning sprays or wipes according to the label instructions. °cdc.gov/coronavirus °04/07/2020 °This information is not intended to replace advice given to you by your health care provider. Make sure you discuss any questions you have with your health care provider. °Document Revised: 07/24/2020 Document Reviewed: 07/24/2020 °Elsevier Patient Education © 2021 Elsevier Inc. °If you have any questions or concerns after the infusion please call the Advanced Practice Provider on call at [phone]. This number is ONLY intended for your use regarding questions or concerns about the infusion post-treatment side-effects.  Please do not provide this number to others for use. For return to work notes please contact your primary care provider.   ° °If someone you know is interested in receiving treatment please have them contact their MD for a referral or visit [email or web address] ° ° ° °

## 2020-10-04 NOTE — Progress Notes (Signed)
Patient reviewed Fact Sheet for Patients, Parents, and Caregivers for Emergency Use Authorization (EUA) of remdesivir for the Treatment of Coronavirus. Patient also reviewed and is agreeable to the estimated cost of treatment. Patient is agreeable to proceed.    

## 2020-10-04 NOTE — Progress Notes (Signed)
[place]  [phone] Fax:(336) [phone]  ID: Lauren Lindsey OB: 13-Dec-1960  MR#: [id number]  [id number]  Patient Care Team: [name], MD as PCP - General (Family Medicine) Patient, No Pcp Per (General Practice) [name], RN as Oncology Nurse Navigator  I connected with Lauren Lindsey on 10/04/20 at 11:30 AM EST by telephone visit and verified that I am speaking with the correct person using two identifiers.   I discussed the limitations, risks, security and privacy concerns of performing an evaluation and management service by telemedicine and the availability of in-person appointments. I also discussed with the patient that there may be a patient responsible charge related to this service. The patient expressed understanding and agreed to proceed.   Other persons participating in the visit and their role in the encounter: Patient, MD.  Patient's location: Home. Provider's location: Clinic.  CHIEF COMPLAINT: Stage IV intrahepatic cholangiocarcinoma.  INTERVAL HISTORY: Patient was unable to complete video assisted telemedicine visit, but did agree to telephone only visit for hospital follow-up from acute renal failure and positive COVID test.  She currently feels well and is back to her baseline.  She does not complain of any weakness or fatigue.  Her appetite has improved.  She does not complain of pain today.  She has no neurologic complaints.  She has no chest pain, shortness of breath, cough, or hemoptysis.  She denies any nausea, vomiting, constipation, or diarrhea.  She has no melena or hematochezia.  She has no urinary complaints.  Patient offers no specific complaints today.  REVIEW OF SYSTEMS:   Review of Systems  Constitutional: Negative.  Negative for fever, malaise/fatigue and weight loss.  HENT: Negative.  Negative for congestion and sinus pain.   Respiratory: Negative.  Negative for cough, hemoptysis and shortness of  breath.   Cardiovascular: Negative.  Negative for chest pain and leg swelling.  Gastrointestinal: Negative.  Negative for abdominal pain, blood in stool, constipation, diarrhea, melena, nausea and vomiting.  Genitourinary: Negative.  Negative for dysuria and flank pain.  Musculoskeletal: Negative for back pain.  Skin: Negative.  Negative for rash.  Neurological: Negative.  Negative for dizziness, focal weakness, weakness and headaches.  Psychiatric/Behavioral: Negative.  The patient is not nervous/anxious.     As per HPI. Otherwise, a complete review of systems is negative.  PAST MEDICAL HISTORY: Past Medical History:  Diagnosis Date  . Abnormal Pap smear of cervix   . Anemia    h/o  . Diabetes mellitus without complication ([place])     PAST SURGICAL HISTORY: Past Surgical History:  Procedure Laterality Date  . COLONOSCOPY WITH PROPOFOL N/A 06/15/2020   Procedure: COLONOSCOPY WITH PROPOFOL;  Surgeon: Lin Landsman, MD;  Location: [facility];  Service: Endoscopy;  Laterality: N/A;  Diabetic - oral meds  . ENDOMETRIAL ABLATION    . HYSTEROSCOPY WITH D & C    . LEEP      FAMILY HISTORY: Family History  Problem Relation Age of Onset  . Bleeding Disorder Mother   . Biliary Cirrhosis Mother   . Liver cancer Mother   . Hypertension Mother   . Diabetes Father   . Lung cancer Father   . Breast cancer Neg Hx     ADVANCED DIRECTIVES (Y/N):  N  HEALTH MAINTENANCE: Social History   Tobacco Use  . Smoking status: Never Smoker  . Smokeless tobacco: Never Used  Vaping Use  . Vaping Use: Never used  Substance Use Topics  .  Alcohol use: No  . Drug use: No     Colonoscopy:  PAP:  Bone density:  Lipid panel:  No Known Allergies  Current Outpatient Medications  Medication Sig Dispense Refill  . acetaminophen (TYLENOL) 500 MG tablet Take 500 mg by mouth every 6 (six) hours as needed.    . ferrous sulfate 325 (65 FE) MG tablet Take 1 tablet (325 mg total) by  mouth 2 (two) times daily with a meal. 60 tablet 0  . HYDROcodone-acetaminophen (NORCO/VICODIN) 5-325 MG tablet Take 1 tablet by mouth every 4 (four) hours as needed for moderate pain. 60 tablet 0  . ondansetron (ZOFRAN) 8 MG tablet Take 1 tablet (8 mg total) by mouth every 8 (eight) hours as needed for nausea or vomiting. 30 tablet 2  . vitamin B-12 1000 MCG tablet Take 1 tablet (1,000 mcg total) by mouth daily. 30 tablet 0   No current facility-administered medications for this visit.    OBJECTIVE: There were no vitals filed for this visit.   There is no height or weight on file to calculate BMI.    ECOG FS:0 - Asymptomatic   LAB RESULTS:  Lab Results  Component Value Date   NA 144 10/02/2020   K 3.9 10/02/2020   CL 104 10/02/2020   CO2 28 10/02/2020   GLUCOSE 127 (H) 10/02/2020   BUN 29 (H) 10/02/2020   CREATININE 2.10 (H) 10/02/2020   CALCIUM 8.1 (L) 10/02/2020   PROT 6.6 09/29/2020   ALBUMIN 2.3 (L) 09/29/2020   AST 33 09/29/2020   ALT 31 09/29/2020   ALKPHOS 76 09/29/2020   BILITOT 0.7 09/29/2020   GFRNONAA 27 (L) 10/02/2020   GFRAA >60 05/23/2020    Lab Results  Component Value Date   WBC 5.3 10/02/2020   NEUTROABS 3.0 09/29/2020   HGB 8.5 (L) 10/02/2020   HCT 26.3 (L) 10/02/2020   MCV 74.1 (L) 10/02/2020   PLT 158 10/02/2020     STUDIES: US Renal  Result Date: 09/29/2020 CLINICAL DATA:  Acute renal insufficiency EXAM: RENAL / URINARY TRACT ULTRASOUND COMPLETE COMPARISON:  09/01/2020, CT 06/16/2020 FINDINGS: Right Kidney: Renal measurements: 14.4 x 6.0 x 6.2 cm = volume: 277 mL. Renal cortical thickness and cortical echogenicity are normal. No intrarenal masses or calcifications are seen. There is mild distension of the renal pelvis without associated caliectasis. Left Kidney: Renal measurements: 13.2 x 7.4 x 6.9 cm = volume: 351 mL. Renal cortical thickness and cortical echogenicity are normal. No intrarenal masses or calcifications are seen. There is mild  distension of the renal pelvis without associated caliectasis. Bladder: The bladder is mildly distended. No intraluminal masses or calcifications are seen. Bilateral ureteral jets are identified. Other: None. IMPRESSION: Mild distention of the renal pelves bilaterally possibly related to urinary retention. No associated caliectasis. Otherwise normal renal sonogram. Electronically Signed   By: Fidela Salisbury MD   On: 09/29/2020 01:46    ASSESSMENT: Stage IV intrahepatic cholangiocarcinoma.  PLAN:    1.  Stage IV intrahepatic cholangiocarcinoma: Incidental finding on routine cholecystectomy. PET scan results from July 13, 2020 as well as MRI results from July 20, 2020 reviewed independently with right lobe liver lesion consistent with intrahepatic cholangiocarcinoma.  Biopsy confirmed the results.  Gynecologic work-up is essentially negative.  Colonoscopy on June 15, 2020 was essentially within normal limits.  Mammogram on July 19, 2020 was reported as BI-RADS 2. All patient tumor markers are negative.  Patient has declined port placement at this time.  Plan to  give cisplatin and gemcitabine on day 1 with gemcitabine only on day 8.  This will be a 21-day cycle and will reimage after four cycles.  Patient received cycle 2, day 1 on September 21, 2020, but then secondary to acute renal failure and positive COVID test treatment has been delayed.  Patient feels back to her baseline, but will have to remain in quarantine for at least 14 days prior to reinitiating treatment.  Return to clinic on October 12, 2020 for gemcitabine only.  Will consider readding cisplatin once her renal function improves.   2.  Sinus congestion: Resolved.  Possibly related to COVID infection. 3.  Diarrhea: Patient does not complain of this today. 4.  Leukocytosis: Resolved. 5.  Anemia: Chronic and unchanged.  Patient's most recent hemoglobin was stable at 8.5. 6.  Thrombocytosis: Resolved. 7.  Flank pain: Patient does  not complain of this today.  Likely secondary to malignancy.  Abdominal ultrasound was unrevealing.   8.  Elevated AST: Resolved. 9.  Acute renal failure: Likely multifactorial secondary to COVID infection and dehydration.  Upon discharge, patient's creatinine improved to 2.1.  Return to treatment as above.  And consider adding in cisplatin once renal insufficiency resolves. Patient expressed understanding and was in agreement with this plan. She also understands that She can call clinic at any time with any questions, concerns, or complaints.   I provided 30 minutes of non face-to-face telephone visit time during this encounter which included chart review, counseling, and coordination of care as documented above.   Cancer Staging Intrahepatic cholangiocarcinoma ([place]) Staging form: Intrahepatic Bile Duct, AJCC 8th Edition - Clinical stage from 08/04/2020: Stage IV (cT1b, cN0, pM1) - Signed by Lloyd Huger, MD on 08/04/2020   Lloyd Huger, MD   10/04/2020 12:09 PM

## 2020-10-07 NOTE — Progress Notes (Signed)
[place]  Telephone:(336) [phone] [phone]) [phone]  ID: Lauren Lindsey OB: 27-Apr-1961  MR#: [id number]  SWN#:[id number]  Patient Care Team: [name], MD as PCP - General (Family Medicine) Patient, No Pcp Per (General Practice) [name], RN as Oncology Nurse Navigator   CHIEF COMPLAINT: Stage IV intrahepatic cholangiocarcinoma.  INTERVAL HISTORY: Patient returns to clinic today for further evaluation and reinitiation of chemotherapy which will be cycle 2, day 8 gemcitabine only.  She is fully recovered from her acute renal failure and COVID-positive test. She does not complain of any weakness or fatigue.  Her appetite has improved.  She does not complain of pain today.  She has no neurologic complaints.  She has no chest pain, shortness of breath, cough, or hemoptysis.  She denies any nausea, vomiting, constipation, or diarrhea.  She has no melena or hematochezia.  She has no urinary complaints.  Patient offers no specific complaints today.  REVIEW OF SYSTEMS:   Review of Systems  Constitutional: Negative.  Negative for fever, malaise/fatigue and weight loss.  HENT: Negative.  Negative for congestion and sinus pain.   Respiratory: Negative.  Negative for cough, hemoptysis and shortness of breath.   Cardiovascular: Negative.  Negative for chest pain and leg swelling.  Gastrointestinal: Negative.  Negative for abdominal pain, blood in stool, constipation, diarrhea, melena, nausea and vomiting.  Genitourinary: Negative.  Negative for dysuria and flank pain.  Musculoskeletal: Negative for back pain.  Skin: Negative.  Negative for rash.  Neurological: Negative.  Negative for dizziness, focal weakness, weakness and headaches.  Psychiatric/Behavioral: Negative.  The patient is not nervous/anxious.     As per HPI. Otherwise, a complete review of systems is negative.  PAST MEDICAL HISTORY: Past Medical History:  Diagnosis Date  . Abnormal Pap smear  of cervix   . Anemia    h/o  . Diabetes mellitus without complication ([place])     PAST SURGICAL HISTORY: Past Surgical History:  Procedure Laterality Date  . COLONOSCOPY WITH PROPOFOL N/A 06/15/2020   Procedure: COLONOSCOPY WITH PROPOFOL;  Surgeon: Lin Landsman, MD;  Location: [facility];  Service: Endoscopy;  Laterality: N/A;  Diabetic - oral meds  . ENDOMETRIAL ABLATION    . HYSTEROSCOPY WITH D & C    . LEEP      FAMILY HISTORY: Family History  Problem Relation Age of Onset  . Bleeding Disorder Mother   . Biliary Cirrhosis Mother   . Liver cancer Mother   . Hypertension Mother   . Diabetes Father   . Lung cancer Father   . Breast cancer Neg Hx     ADVANCED DIRECTIVES (Y/N):  N  HEALTH MAINTENANCE: Social History   Tobacco Use  . Smoking status: Never Smoker  . Smokeless tobacco: Never Used  Vaping Use  . Vaping Use: Never used  Substance Use Topics  . Alcohol use: No  . Drug use: No     Colonoscopy:  PAP:  Bone density:  Lipid panel:  No Known Allergies  Current Outpatient Medications  Medication Sig Dispense Refill  . acetaminophen (TYLENOL) 500 MG tablet Take 500 mg by mouth every 6 (six) hours as needed.    . ferrous sulfate 325 (65 FE) MG tablet Take 1 tablet (325 mg total) by mouth 2 (two) times daily with a meal. 60 tablet 0  . HYDROcodone-acetaminophen (NORCO/VICODIN) 5-325 MG tablet Take 1 tablet by mouth every 4 (four) hours as needed for moderate pain. 60 tablet 0  .  ondansetron (ZOFRAN) 8 MG tablet Take 1 tablet (8 mg total) by mouth every 8 (eight) hours as needed for nausea or vomiting. 30 tablet 2  . vitamin B-12 1000 MCG tablet Take 1 tablet (1,000 mcg total) by mouth daily. 30 tablet 0   No current facility-administered medications for this visit.    OBJECTIVE: There were no vitals filed for this visit.   There is no height or weight on file to calculate BMI.    ECOG FS:0 - Asymptomatic   LAB RESULTS:  Lab Results   Component Value Date   NA 144 10/02/2020   K 3.9 10/02/2020   CL 104 10/02/2020   CO2 28 10/02/2020   GLUCOSE 127 (H) 10/02/2020   BUN 29 (H) 10/02/2020   CREATININE 2.10 (H) 10/02/2020   CALCIUM 8.1 (L) 10/02/2020   PROT 6.6 09/29/2020   ALBUMIN 2.3 (L) 09/29/2020   AST 33 09/29/2020   ALT 31 09/29/2020   ALKPHOS 76 09/29/2020   BILITOT 0.7 09/29/2020   GFRNONAA 27 (L) 10/02/2020   GFRAA >60 05/23/2020    Lab Results  Component Value Date   WBC 5.3 10/02/2020   NEUTROABS 3.0 09/29/2020   HGB 8.5 (L) 10/02/2020   HCT 26.3 (L) 10/02/2020   MCV 74.1 (L) 10/02/2020   PLT 158 10/02/2020     STUDIES: US Renal  Result Date: 09/29/2020 CLINICAL DATA:  Acute renal insufficiency EXAM: RENAL / URINARY TRACT ULTRASOUND COMPLETE COMPARISON:  09/01/2020, CT 06/16/2020 FINDINGS: Right Kidney: Renal measurements: 14.4 x 6.0 x 6.2 cm = volume: 277 mL. Renal cortical thickness and cortical echogenicity are normal. No intrarenal masses or calcifications are seen. There is mild distension of the renal pelvis without associated caliectasis. Left Kidney: Renal measurements: 13.2 x 7.4 x 6.9 cm = volume: 351 mL. Renal cortical thickness and cortical echogenicity are normal. No intrarenal masses or calcifications are seen. There is mild distension of the renal pelvis without associated caliectasis. Bladder: The bladder is mildly distended. No intraluminal masses or calcifications are seen. Bilateral ureteral jets are identified. Other: None. IMPRESSION: Mild distention of the renal pelves bilaterally possibly related to urinary retention. No associated caliectasis. Otherwise normal renal sonogram. Electronically Signed   By: Fidela Salisbury MD   On: 09/29/2020 01:46    ASSESSMENT: Stage IV intrahepatic cholangiocarcinoma.  PLAN:    1.  Stage IV intrahepatic cholangiocarcinoma: Incidental finding on routine cholecystectomy. PET scan results from July 13, 2020 as well as MRI results from July 20, 2020 reviewed independently with right lobe liver lesion consistent with intrahepatic cholangiocarcinoma.  Biopsy confirmed the results.  Gynecologic work-up is essentially negative.  Colonoscopy on June 15, 2020 was essentially within normal limits.  Mammogram on July 19, 2020 was reported as BI-RADS 2. All patient tumor markers are negative.  Patient has declined port placement at this time.  Plan to give cisplatin and gemcitabine on day 1 with gemcitabine only on day 8.  This will be a 21-day cycle and will reimage after four cycles.  Patient received cycle 2, day 1 on September 21, 2020, but then secondary to acute renal failure and positive COVID test treatment has been delayed.  Proceed with cycle 2, day 8 of treatment today, gemcitabine only.  In order to get patient back on track with her treatments she will return to clinic in 1 week for further evaluation and consideration of cycle 3, day 1.     2.  Sinus congestion: Resolved.  Possibly related to  COVID infection. 3.  Diarrhea: Patient does not complain of this today. 4.  Leukocytosis: Mild, monitor. 5.  Anemia: Mildly improved.  Patient's hemoglobin is 9.3 today. 6.  Thrombocytosis: Likely reactive.  Monitor. 7.  Flank pain: Patient does not complain of this today.  Likely secondary to malignancy.  Abdominal ultrasound was unrevealing.   8.  Elevated AST: Mild, monitor. 9.  Acute renal failure: Essentially resolved.  Likely multifactorial secondary to COVID infection and dehydration.    Patient expressed understanding and was in agreement with this plan. She also understands that She can call clinic at any time with any questions, concerns, or complaints.    Cancer Staging Intrahepatic cholangiocarcinoma ([place]) Staging form: Intrahepatic Bile Duct, AJCC 8th Edition - Clinical stage from 08/04/2020: Stage IV (cT1b, cN0, pM1) - Signed by Lloyd Huger, MD on 08/04/2020   Lloyd Huger, MD   10/07/2020 8:19 AM

## 2020-10-12 ENCOUNTER — Inpatient Hospital Stay: Payer: BC Managed Care – PPO | Admitting: Oncology

## 2020-10-12 ENCOUNTER — Inpatient Hospital Stay: Payer: BC Managed Care – PPO

## 2020-10-12 ENCOUNTER — Ambulatory Visit: Payer: BC Managed Care – PPO | Admitting: Oncology

## 2020-10-12 ENCOUNTER — Encounter: Payer: Self-pay | Admitting: Oncology

## 2020-10-12 ENCOUNTER — Ambulatory Visit: Payer: BC Managed Care – PPO

## 2020-10-12 ENCOUNTER — Other Ambulatory Visit: Payer: BC Managed Care – PPO

## 2020-10-12 VITALS — BP 135/87 | HR 77 | Temp 98.1°F | Resp 16 | Wt 158.8 lb

## 2020-10-12 DIAGNOSIS — D751 Secondary polycythemia: Secondary | ICD-10-CM | POA: Diagnosis not present

## 2020-10-12 DIAGNOSIS — N179 Acute kidney failure, unspecified: Secondary | ICD-10-CM | POA: Diagnosis not present

## 2020-10-12 DIAGNOSIS — C221 Intrahepatic bile duct carcinoma: Secondary | ICD-10-CM | POA: Insufficient documentation

## 2020-10-12 DIAGNOSIS — Z803 Family history of malignant neoplasm of breast: Secondary | ICD-10-CM | POA: Diagnosis not present

## 2020-10-12 DIAGNOSIS — D72829 Elevated white blood cell count, unspecified: Secondary | ICD-10-CM | POA: Diagnosis not present

## 2020-10-12 DIAGNOSIS — E119 Type 2 diabetes mellitus without complications: Secondary | ICD-10-CM | POA: Insufficient documentation

## 2020-10-12 DIAGNOSIS — Z8616 Personal history of COVID-19: Secondary | ICD-10-CM | POA: Insufficient documentation

## 2020-10-12 DIAGNOSIS — R531 Weakness: Secondary | ICD-10-CM | POA: Diagnosis not present

## 2020-10-12 DIAGNOSIS — D649 Anemia, unspecified: Secondary | ICD-10-CM | POA: Insufficient documentation

## 2020-10-12 DIAGNOSIS — R5383 Other fatigue: Secondary | ICD-10-CM | POA: Diagnosis not present

## 2020-10-12 DIAGNOSIS — Z801 Family history of malignant neoplasm of trachea, bronchus and lung: Secondary | ICD-10-CM | POA: Diagnosis not present

## 2020-10-12 DIAGNOSIS — C786 Secondary malignant neoplasm of retroperitoneum and peritoneum: Secondary | ICD-10-CM

## 2020-10-12 DIAGNOSIS — R630 Anorexia: Secondary | ICD-10-CM | POA: Diagnosis not present

## 2020-10-12 DIAGNOSIS — Z5111 Encounter for antineoplastic chemotherapy: Secondary | ICD-10-CM | POA: Diagnosis not present

## 2020-10-12 DIAGNOSIS — R197 Diarrhea, unspecified: Secondary | ICD-10-CM | POA: Diagnosis not present

## 2020-10-12 DIAGNOSIS — R0981 Nasal congestion: Secondary | ICD-10-CM | POA: Insufficient documentation

## 2020-10-12 LAB — CBC WITH DIFFERENTIAL/PLATELET
Abs Immature Granulocytes: 0.13 10*3/uL — ABNORMAL HIGH (ref 0.00–0.07)
Basophils Absolute: 0.1 10*3/uL (ref 0.0–0.1)
Basophils Relative: 1 %
Eosinophils Absolute: 0.3 10*3/uL (ref 0.0–0.5)
Eosinophils Relative: 3 %
HCT: 29.6 % — ABNORMAL LOW (ref 36.0–46.0)
Hemoglobin: 9.3 g/dL — ABNORMAL LOW (ref 12.0–15.0)
Immature Granulocytes: 1 %
Lymphocytes Relative: 22 %
Lymphs Abs: 2.4 10*3/uL (ref 0.7–4.0)
MCH: 24.1 pg — ABNORMAL LOW (ref 26.0–34.0)
MCHC: 31.4 g/dL (ref 30.0–36.0)
MCV: 76.7 fL — ABNORMAL LOW (ref 80.0–100.0)
Monocytes Absolute: 1.5 10*3/uL — ABNORMAL HIGH (ref 0.1–1.0)
Monocytes Relative: 14 %
Neutro Abs: 6.3 10*3/uL (ref 1.7–7.7)
Neutrophils Relative %: 59 %
Platelets: 651 10*3/uL — ABNORMAL HIGH (ref 150–400)
RBC: 3.86 MIL/uL — ABNORMAL LOW (ref 3.87–5.11)
RDW: 20.1 % — ABNORMAL HIGH (ref 11.5–15.5)
WBC: 10.7 10*3/uL — ABNORMAL HIGH (ref 4.0–10.5)
nRBC: 0 % (ref 0.0–0.2)

## 2020-10-12 LAB — COMPREHENSIVE METABOLIC PANEL
ALT: 41 U/L (ref 0–44)
AST: 66 U/L — ABNORMAL HIGH (ref 15–41)
Albumin: 3.1 g/dL — ABNORMAL LOW (ref 3.5–5.0)
Alkaline Phosphatase: 88 U/L (ref 38–126)
Anion gap: 10 (ref 5–15)
BUN: 15 mg/dL (ref 6–20)
CO2: 28 mmol/L (ref 22–32)
Calcium: 8.6 mg/dL — ABNORMAL LOW (ref 8.9–10.3)
Chloride: 100 mmol/L (ref 98–111)
Creatinine, Ser: 1.08 mg/dL — ABNORMAL HIGH (ref 0.44–1.00)
GFR, Estimated: 60 mL/min — ABNORMAL LOW (ref >60)
Glucose, Bld: 103 mg/dL — ABNORMAL HIGH (ref 70–99)
Potassium: 3.6 mmol/L (ref 3.5–5.1)
Sodium: 138 mmol/L (ref 135–145)
Total Bilirubin: 0.6 mg/dL (ref 0.3–1.2)
Total Protein: 8.3 g/dL — ABNORMAL HIGH (ref 6.5–8.1)

## 2020-10-12 MED ORDER — PROCHLORPERAZINE MALEATE 10 MG PO TABS
10.0000 mg | ORAL_TABLET | Freq: Once | ORAL | Status: AC
  Administered 2020-10-12: 10 mg via ORAL
  Filled 2020-10-12: qty 1

## 2020-10-12 MED ORDER — SODIUM CHLORIDE 0.9 % IV SOLN
1000.0000 mg/m2 | Freq: Once | INTRAVENOUS | Status: DC
  Filled 2020-10-12: qty 49

## 2020-10-12 MED ORDER — PROCHLORPERAZINE MALEATE 10 MG PO TABS: 10.0000 mg | ORAL_TABLET | Freq: Once | ORAL | Status: DC

## 2020-10-12 MED ORDER — SODIUM CHLORIDE 0.9 % IV SOLN
Freq: Once | INTRAVENOUS | Status: AC
  Filled 2020-10-12: qty 250

## 2020-10-12 MED ORDER — SODIUM CHLORIDE 0.9 % IV SOLN
1000.0000 mg/m2 | Freq: Once | INTRAVENOUS | Status: AC
  Administered 2020-10-12: 1824 mg via INTRAVENOUS
  Filled 2020-10-12: qty 47.97

## 2020-10-12 NOTE — Progress Notes (Signed)
Patient wants to discuss her medicine with you. She stated the Vitamin B 12 makes her eat a lot and throws her sugar of wack. The iron tablet is causing her to feel dizzy so she is not currently taking the Vitamin B 12 or the Iron pills

## 2020-10-14 NOTE — Progress Notes (Signed)
Lauren Lindsey  [phone]) [phone] Fax:(336) [phone]  ID: Lauren Lindsey OB: Feb 17, 1961  MR#: [id number]  RSW#:[id number]  Patient Care Team: [name], MD as PCP - General (Family Medicine) Patient, No Pcp Per (General Practice) [name], RN as Oncology Nurse Navigator   CHIEF COMPLAINT: Stage IV intrahepatic cholangiocarcinoma.  INTERVAL HISTORY: Patient returns to clinic today for further evaluation and consideration of cycle 3, day 1 of cisplatin and gemcitabine.  She currently feels well and is asymptomatic. She does not complain of any weakness or fatigue.  Her appetite has improved.  She does not complain of pain today.  She has no neurologic complaints.  She has no chest pain, shortness of breath, cough, or hemoptysis.  She denies any nausea, vomiting, constipation, or diarrhea.  She has no melena or hematochezia.  She has no urinary complaints.  Patient offers no specific complaints today.  REVIEW OF SYSTEMS:   Review of Systems  Constitutional: Negative.  Negative for fever, malaise/fatigue and weight loss.  HENT: Negative.  Negative for congestion and sinus pain.   Respiratory: Negative.  Negative for cough, hemoptysis and shortness of breath.   Cardiovascular: Negative.  Negative for chest pain and leg swelling.  Gastrointestinal: Negative.  Negative for abdominal pain, blood in stool, constipation, diarrhea, melena, nausea and vomiting.  Genitourinary: Negative.  Negative for dysuria and flank pain.  Musculoskeletal: Negative for back pain.  Skin: Negative.  Negative for rash.  Neurological: Negative.  Negative for dizziness, focal weakness, weakness and headaches.  Psychiatric/Behavioral: Negative.  The patient is not nervous/anxious.     As per HPI. Otherwise, a complete review of systems is negative.  PAST MEDICAL HISTORY: Past Medical History:  Diagnosis Date  . Abnormal Pap smear of cervix   . Anemia    h/o  . Diabetes  mellitus without complication ([place])     PAST SURGICAL HISTORY: Past Surgical History:  Procedure Laterality Date  . COLONOSCOPY WITH PROPOFOL N/A 06/15/2020   Procedure: COLONOSCOPY WITH PROPOFOL;  Surgeon: Lin Landsman, MD;  Location: [facility];  Service: Endoscopy;  Laterality: N/A;  Diabetic - oral meds  . ENDOMETRIAL ABLATION    . HYSTEROSCOPY WITH D & C    . LEEP      FAMILY HISTORY: Family History  Problem Relation Age of Onset  . Bleeding Disorder Mother   . Biliary Cirrhosis Mother   . Liver cancer Mother   . Hypertension Mother   . Diabetes Father   . Lung cancer Father   . Breast cancer Neg Hx     ADVANCED DIRECTIVES (Y/N):  N  HEALTH MAINTENANCE: Social History   Tobacco Use  . Smoking status: Never Smoker  . Smokeless tobacco: Never Used  Vaping Use  . Vaping Use: Never used  Substance Use Topics  . Alcohol use: No  . Drug use: No     Colonoscopy:  PAP:  Bone density:  Lipid panel:  No Known Allergies  Current Outpatient Medications  Medication Sig Dispense Refill  . acetaminophen (TYLENOL) 500 MG tablet Take 500 mg by mouth every 6 (six) hours as needed.    . ferrous sulfate 325 (65 FE) MG tablet Take 1 tablet (325 mg total) by mouth 2 (two) times daily with a meal. (Patient not taking: No sig reported) 60 tablet 0  . HYDROcodone-acetaminophen (NORCO/VICODIN) 5-325 MG tablet Take 1 tablet by mouth every 4 (four) hours as needed for moderate pain. (Patient not taking: No sig  reported) 60 tablet 0  . ondansetron (ZOFRAN) 8 MG tablet Take 1 tablet (8 mg total) by mouth every 8 (eight) hours as needed for nausea or vomiting. (Patient not taking: No sig reported) 30 tablet 2  . vitamin B-12 1000 MCG tablet Take 1 tablet (1,000 mcg total) by mouth daily. (Patient not taking: No sig reported) 30 tablet 0   No current facility-administered medications for this visit.   Facility-Administered Medications Ordered in Other Visits  Medication  Dose Route Frequency Provider Last Rate Last Admin  . 0.9 %  sodium chloride infusion   Intravenous Once Lloyd Huger, MD      . CISplatin (PLATINOL) 148 mg in sodium chloride 0.9 % 500 mL chemo infusion  80 mg/m2 (Treatment Plan Recorded) Intravenous Once Lloyd Huger, MD      . dexamethasone (DECADRON) 10 mg in sodium chloride 0.9 % 50 mL IVPB  10 mg Intravenous Once Lloyd Huger, MD      . fosaprepitant (EMEND) 150 mg in sodium chloride 0.9 % 145 mL IVPB  150 mg Intravenous Once Lloyd Huger, MD      . gemcitabine (GEMZAR) 1,800 mg in sodium chloride 0.9 % 250 mL chemo infusion  1,800 mg Intravenous Once Lloyd Huger, MD      . magnesium sulfate IVPB 2 g 50 mL  2 g Intravenous Once Lloyd Huger, MD 50 mL/hr at 10/19/20 0958 2 g at 10/19/20 0958  . palonosetron (ALOXI) injection 0.25 mg  0.25 mg Intravenous Once Lloyd Huger, MD        OBJECTIVE: Vitals:   10/19/20 0905  BP: 133/75  Pulse: 75  Resp: 18  Temp: 97.6 F (36.4 C)     Body mass index is 25.61 kg/m.    ECOG FS:0 - Asymptomatic   General: Well-developed, well-nourished, no acute distress. Eyes: Pink conjunctiva, anicteric sclera. HEENT: Normocephalic, moist mucous membranes. Lungs: No audible wheezing or coughing. Heart: Regular rate and rhythm. Abdomen: Soft, nontender, no obvious distention. Musculoskeletal: No edema, cyanosis, or clubbing. Neuro: Alert, answering all questions appropriately. Cranial nerves grossly intact. Skin: No rashes or petechiae noted. Psych: Normal affect.  LAB RESULTS:  Lab Results  Component Value Date   NA 138 10/19/2020   K 4.1 10/19/2020   CL 102 10/19/2020   CO2 27 10/19/2020   GLUCOSE 127 (H) 10/19/2020   BUN 16 10/19/2020   CREATININE 1.02 (H) 10/19/2020   CALCIUM 8.9 10/19/2020   PROT 8.1 10/19/2020   ALBUMIN 3.3 (L) 10/19/2020   AST 69 (H) 10/19/2020   ALT 45 (H) 10/19/2020   ALKPHOS 70 10/19/2020   BILITOT 0.4 10/19/2020    GFRNONAA >60 10/19/2020   GFRAA >60 05/23/2020    Lab Results  Component Value Date   WBC 4.4 10/19/2020   NEUTROABS 1.7 10/19/2020   HGB 8.6 (L) 10/19/2020   HCT 27.7 (L) 10/19/2020   MCV 78.2 (L) 10/19/2020   PLT 380 10/19/2020     STUDIES: US Renal  Result Date: 09/29/2020 CLINICAL DATA:  Acute renal insufficiency EXAM: RENAL / URINARY TRACT ULTRASOUND COMPLETE COMPARISON:  09/01/2020, CT 06/16/2020 FINDINGS: Right Kidney: Renal measurements: 14.4 x 6.0 x 6.2 cm = volume: 277 mL. Renal cortical thickness and cortical echogenicity are normal. No intrarenal masses or calcifications are seen. There is mild distension of the renal pelvis without associated caliectasis. Left Kidney: Renal measurements: 13.2 x 7.4 x 6.9 cm = volume: 351 mL. Renal cortical thickness and cortical  echogenicity are normal. No intrarenal masses or calcifications are seen. There is mild distension of the renal pelvis without associated caliectasis. Bladder: The bladder is mildly distended. No intraluminal masses or calcifications are seen. Bilateral ureteral jets are identified. Other: None. IMPRESSION: Mild distention of the renal pelves bilaterally possibly related to urinary retention. No associated caliectasis. Otherwise normal renal sonogram. Electronically Signed   By: Fidela Salisbury MD   On: 09/29/2020 01:46    ASSESSMENT: Stage IV intrahepatic cholangiocarcinoma.  PLAN:    1.  Stage IV intrahepatic cholangiocarcinoma: Incidental finding on routine cholecystectomy. PET scan results from July 13, 2020 as well as MRI results from July 20, 2020 reviewed independently with right lobe liver lesion consistent with intrahepatic cholangiocarcinoma.  Biopsy confirmed the results.  Gynecologic work-up is essentially negative.  Colonoscopy on June 15, 2020 was essentially within normal limits.  Mammogram on July 19, 2020 was reported as BI-RADS 2. All patient tumor markers are negative.  Patient has  declined port placement at this time.  Plan to give cisplatin and gemcitabine on day 1 with gemcitabine only on day 8.  This will be a 21-day cycle and will reimage after four cycles.  Patient received cycle 2, day 1 on September 21, 2020, but then secondary to acute renal failure and positive COVID test treatment has been delayed.  She received cycle 2, day 8 last week.  Proceed with cycle 3, day 1 of treatment which is both cisplatin and gemcitabine.  Return to clinic in 1 week for further evaluation and consideration of cycle 3, day 8 gemcitabine only.     2.  Sinus congestion: Resolved.  Possibly related to COVID infection. 3.  Diarrhea: Patient does not complain of this today. 4.  Leukocytosis: Resolved.. 5.  Anemia: Hemoglobin has trended down slightly to 8.6, monitor. 6.  Thrombocytosis: Resolved. 7.  Flank pain: Patient does not complain of this today.  Likely secondary to malignancy.  Abdominal ultrasound was unrevealing.   8.  Elevated liver enzymes: Mild, monitor. 9.  Acute renal failure: Resolved.  Patient expressed understanding and was in agreement with this plan. She also understands that She can call clinic at any time with any questions, concerns, or complaints.    Cancer Staging Intrahepatic cholangiocarcinoma ([place]) Staging form: Intrahepatic Bile Duct, AJCC 8th Edition - Clinical stage from 08/04/2020: Stage IV (cT1b, cN0, pM1) - Signed by Lloyd Huger, MD on 08/04/2020   Lloyd Huger, MD   10/19/2020 10:38 AM

## 2020-10-16 ENCOUNTER — Telehealth: Payer: Self-pay | Admitting: *Deleted

## 2020-10-16 NOTE — Telephone Encounter (Signed)
Patient's husband called to say they missed a call from your office please call back.

## 2020-10-19 ENCOUNTER — Inpatient Hospital Stay: Payer: BC Managed Care – PPO

## 2020-10-19 ENCOUNTER — Inpatient Hospital Stay (HOSPITAL_BASED_OUTPATIENT_CLINIC_OR_DEPARTMENT_OTHER): Payer: BC Managed Care – PPO | Admitting: Oncology

## 2020-10-19 ENCOUNTER — Encounter: Payer: Self-pay | Admitting: Oncology

## 2020-10-19 VITALS — BP 133/75 | HR 75 | Temp 97.6°F | Resp 18 | Wt 153.9 lb

## 2020-10-19 DIAGNOSIS — C221 Intrahepatic bile duct carcinoma: Secondary | ICD-10-CM

## 2020-10-19 LAB — COMPREHENSIVE METABOLIC PANEL
ALT: 45 U/L — ABNORMAL HIGH (ref 0–44)
AST: 69 U/L — ABNORMAL HIGH (ref 15–41)
Albumin: 3.3 g/dL — ABNORMAL LOW (ref 3.5–5.0)
Alkaline Phosphatase: 70 U/L (ref 38–126)
Anion gap: 9 (ref 5–15)
BUN: 16 mg/dL (ref 6–20)
CO2: 27 mmol/L (ref 22–32)
Calcium: 8.9 mg/dL (ref 8.9–10.3)
Chloride: 102 mmol/L (ref 98–111)
Creatinine, Ser: 1.02 mg/dL — ABNORMAL HIGH (ref 0.44–1.00)
GFR, Estimated: >60 mL/min (ref >60)
Glucose, Bld: 127 mg/dL — ABNORMAL HIGH (ref 70–99)
Potassium: 4.1 mmol/L (ref 3.5–5.1)
Sodium: 138 mmol/L (ref 135–145)
Total Bilirubin: 0.4 mg/dL (ref 0.3–1.2)
Total Protein: 8.1 g/dL (ref 6.5–8.1)

## 2020-10-19 LAB — CBC WITH DIFFERENTIAL/PLATELET
Abs Immature Granulocytes: 0.07 10*3/uL (ref 0.00–0.07)
Basophils Absolute: 0.1 10*3/uL (ref 0.0–0.1)
Basophils Relative: 2 %
Eosinophils Absolute: 0.1 10*3/uL (ref 0.0–0.5)
Eosinophils Relative: 1 %
HCT: 27.7 % — ABNORMAL LOW (ref 36.0–46.0)
Hemoglobin: 8.6 g/dL — ABNORMAL LOW (ref 12.0–15.0)
Immature Granulocytes: 2 %
Lymphocytes Relative: 35 %
Lymphs Abs: 1.6 10*3/uL (ref 0.7–4.0)
MCH: 24.3 pg — ABNORMAL LOW (ref 26.0–34.0)
MCHC: 31 g/dL (ref 30.0–36.0)
MCV: 78.2 fL — ABNORMAL LOW (ref 80.0–100.0)
Monocytes Absolute: 1 10*3/uL (ref 0.1–1.0)
Monocytes Relative: 22 %
Neutro Abs: 1.7 10*3/uL (ref 1.7–7.7)
Neutrophils Relative %: 38 %
Platelets: 380 10*3/uL (ref 150–400)
RBC: 3.54 MIL/uL — ABNORMAL LOW (ref 3.87–5.11)
RDW: 20.5 % — ABNORMAL HIGH (ref 11.5–15.5)
WBC: 4.4 10*3/uL (ref 4.0–10.5)
nRBC: 0 % (ref 0.0–0.2)

## 2020-10-19 MED ORDER — SODIUM CHLORIDE 0.9 % IV SOLN
150.0000 mg | Freq: Once | INTRAVENOUS | Status: AC
  Administered 2020-10-19: 150 mg via INTRAVENOUS
  Filled 2020-10-19: qty 150

## 2020-10-19 MED ORDER — SODIUM CHLORIDE 0.9 % IV SOLN
10.0000 mg | Freq: Once | INTRAVENOUS | Status: AC
  Administered 2020-10-19: 10 mg via INTRAVENOUS
  Filled 2020-10-19: qty 10

## 2020-10-19 MED ORDER — SODIUM CHLORIDE 0.9 % IV SOLN
1800.0000 mg | Freq: Once | INTRAVENOUS | Status: AC
  Administered 2020-10-19: 1800 mg via INTRAVENOUS
  Filled 2020-10-19: qty 26.3

## 2020-10-19 MED ORDER — SODIUM CHLORIDE 0.9 % IV SOLN
Freq: Once | INTRAVENOUS | Status: DC
  Filled 2020-10-19: qty 250

## 2020-10-19 MED ORDER — MAGNESIUM SULFATE 2 GM/50ML IV SOLN
2.0000 g | Freq: Once | INTRAVENOUS | Status: AC
  Administered 2020-10-19: 2 g via INTRAVENOUS
  Filled 2020-10-19: qty 50

## 2020-10-19 MED ORDER — SODIUM CHLORIDE 0.9 % IV SOLN
Freq: Once | INTRAVENOUS | Status: AC
  Filled 2020-10-19: qty 250

## 2020-10-19 MED ORDER — SODIUM CHLORIDE 0.9 % IV SOLN
1000.0000 mg/m2 | Freq: Once | INTRAVENOUS | Status: DC
  Filled 2020-10-19: qty 49

## 2020-10-19 MED ORDER — PALONOSETRON HCL INJECTION 0.25 MG/5ML
0.2500 mg | Freq: Once | INTRAVENOUS | Status: AC
  Administered 2020-10-19: 0.25 mg via INTRAVENOUS
  Filled 2020-10-19: qty 5

## 2020-10-19 MED ORDER — SODIUM CHLORIDE 0.9 % IV SOLN
80.0000 mg/m2 | Freq: Once | INTRAVENOUS | Status: AC
  Administered 2020-10-19: 148 mg via INTRAVENOUS
  Filled 2020-10-19: qty 50

## 2020-10-19 MED ORDER — POTASSIUM CHLORIDE IN NACL 20-0.9 MEQ/L-% IV SOLN
Freq: Once | INTRAVENOUS | Status: AC
  Filled 2020-10-19: qty 1000

## 2020-10-19 NOTE — Progress Notes (Signed)
Pt tolerated infusion well. Pt stable at discharge. 

## 2020-10-19 NOTE — Progress Notes (Signed)
Pt and husband in for follow up, pt states weight recorded on 10/12/20 visit was incorrect.  States it was 152. Pt denies any concerns today.

## 2020-10-20 NOTE — Progress Notes (Signed)
[facility]  Telephone:(336) [phone] Fax:(336) [phone]  ID: Lauren Lindsey OB: [birth year]  MR#: [id number]  QIO#:[id number]  Patient Care Team: [name], MD as PCP - General (Family Medicine) Patient, No Pcp Per (General Practice) [name], RN as Oncology Nurse Navigator   CHIEF COMPLAINT: Stage IV intrahepatic cholangiocarcinoma.  INTERVAL HISTORY: Returns to clinic today for further evaluation and consideration of cycle 3, day 8 of cisplatin and gemcitabine.  Gemcitabine only.  She has increased weakness and fatigue, but otherwise feels well. She does not complain of pain today.  She has no neurologic complaints.  She has no chest pain, shortness of breath, cough, or hemoptysis.  She denies any nausea, vomiting, constipation, or diarrhea.  She has no melena or hematochezia.  She has no urinary complaints.  Patient offers no further specific complaints today.  REVIEW OF SYSTEMS:   Review of Systems  Constitutional: Positive for malaise/fatigue. Negative for fever and weight loss.  HENT: Negative.  Negative for congestion and sinus pain.   Respiratory: Negative.  Negative for cough, hemoptysis and shortness of breath.   Cardiovascular: Negative.  Negative for chest pain and leg swelling.  Gastrointestinal: Negative.  Negative for abdominal pain, blood in stool, constipation, diarrhea, melena, nausea and vomiting.  Genitourinary: Negative.  Negative for dysuria and flank pain.  Musculoskeletal: Negative for back pain.  Skin: Negative.  Negative for rash.  Neurological: Positive for weakness. Negative for dizziness, focal weakness and headaches.  Psychiatric/Behavioral: Negative.  The patient is not nervous/anxious.     As per HPI. Otherwise, a complete review of systems is negative.  PAST MEDICAL HISTORY: Past Medical History:  Diagnosis Date  . Abnormal Pap smear of cervix   . Anemia    h/o  . Diabetes mellitus without complication ([place])      PAST SURGICAL HISTORY: Past Surgical History:  Procedure Laterality Date  . COLONOSCOPY WITH PROPOFOL N/A 06/15/2020   Procedure: COLONOSCOPY WITH PROPOFOL;  Surgeon: Lin Landsman, MD;  Location: [facility];  Service: Endoscopy;  Laterality: N/A;  Diabetic - oral meds  . ENDOMETRIAL ABLATION    . HYSTEROSCOPY WITH D & C    . LEEP      FAMILY HISTORY: Family History  Problem Relation Age of Onset  . Bleeding Disorder Mother   . Biliary Cirrhosis Mother   . Liver cancer Mother   . Hypertension Mother   . Diabetes Father   . Lung cancer Father   . Breast cancer Neg Hx     ADVANCED DIRECTIVES (Y/N):  N  HEALTH MAINTENANCE: Social History   Tobacco Use  . Smoking status: Never Smoker  . Smokeless tobacco: Never Used  Vaping Use  . Vaping Use: Never used  Substance Use Topics  . Alcohol use: No  . Drug use: No     Colonoscopy:  PAP:  Bone density:  Lipid panel:  No Known Allergies  Current Outpatient Medications  Medication Sig Dispense Refill  . acetaminophen (TYLENOL) 500 MG tablet Take 500 mg by mouth every 6 (six) hours as needed. (Patient not taking: Reported on 10/26/2020)    . ferrous sulfate 325 (65 FE) MG tablet Take 1 tablet (325 mg total) by mouth 2 (two) times daily with a meal. (Patient not taking: No sig reported) 60 tablet 0  . HYDROcodone-acetaminophen (NORCO/VICODIN) 5-325 MG tablet Take 1 tablet by mouth every 4 (four) hours as needed for moderate pain. (Patient not taking: No sig reported) 60 tablet  0  . metFORMIN (GLUCOPHAGE) 500 MG tablet Take 500 mg by mouth 2 (two) times daily. (Patient not taking: Reported on 10/26/2020)    . ondansetron (ZOFRAN) 8 MG tablet Take 1 tablet (8 mg total) by mouth every 8 (eight) hours as needed for nausea or vomiting. (Patient not taking: No sig reported) 30 tablet 2  . vitamin B-12 1000 MCG tablet Take 1 tablet (1,000 mcg total) by mouth daily. (Patient not taking: No sig reported) 30 tablet 0   No  current facility-administered medications for this visit.    OBJECTIVE: Vitals:   10/26/20 0958  BP: 139/74  Pulse: 71  Resp: 18  Temp: 97.8 F (36.6 C)     Body mass index is 25.24 kg/m.    ECOG FS:0 - Asymptomatic   General: Well-developed, well-nourished, no acute distress. Eyes: Pink conjunctiva, anicteric sclera. HEENT: Normocephalic, moist mucous membranes. Lungs: No audible wheezing or coughing. Heart: Regular rate and rhythm. Abdomen: Soft, nontender, no obvious distention. Musculoskeletal: No edema, cyanosis, or clubbing. Neuro: Alert, answering all questions appropriately. Cranial nerves grossly intact. Skin: No rashes or petechiae noted. Psych: Normal affect.  LAB RESULTS:  Lab Results  Component Value Date   NA 137 10/26/2020   K 4.1 10/26/2020   CL 101 10/26/2020   CO2 26 10/26/2020   GLUCOSE 107 (H) 10/26/2020   BUN 11 10/26/2020   CREATININE 0.68 10/26/2020   CALCIUM 8.8 (L) 10/26/2020   PROT 7.9 10/26/2020   ALBUMIN 3.4 (L) 10/26/2020   AST 63 (H) 10/26/2020   ALT 57 (H) 10/26/2020   ALKPHOS 74 10/26/2020   BILITOT 0.4 10/26/2020   GFRNONAA >60 10/26/2020   GFRAA >60 05/23/2020    Lab Results  Component Value Date   WBC 2.5 (L) 10/26/2020   NEUTROABS 0.6 (L) 10/26/2020   HGB 8.3 (L) 10/26/2020   HCT 25.7 (L) 10/26/2020   MCV 76.7 (L) 10/26/2020   PLT 176 10/26/2020     STUDIES: US Renal  Result Date: 09/29/2020 CLINICAL DATA:  Acute renal insufficiency EXAM: RENAL / URINARY TRACT ULTRASOUND COMPLETE COMPARISON:  09/01/2020, CT 06/16/2020 FINDINGS: Right Kidney: Renal measurements: 14.4 x 6.0 x 6.2 cm = volume: 277 mL. Renal cortical thickness and cortical echogenicity are normal. No intrarenal masses or calcifications are seen. There is mild distension of the renal pelvis without associated caliectasis. Left Kidney: Renal measurements: 13.2 x 7.4 x 6.9 cm = volume: 351 mL. Renal cortical thickness and cortical echogenicity are normal. No  intrarenal masses or calcifications are seen. There is mild distension of the renal pelvis without associated caliectasis. Bladder: The bladder is mildly distended. No intraluminal masses or calcifications are seen. Bilateral ureteral jets are identified. Other: None. IMPRESSION: Mild distention of the renal pelves bilaterally possibly related to urinary retention. No associated caliectasis. Otherwise normal renal sonogram. Electronically Signed   By: Fidela Salisbury MD   On: 09/29/2020 01:46    ASSESSMENT: Stage IV intrahepatic cholangiocarcinoma.  PLAN:    1.  Stage IV intrahepatic cholangiocarcinoma: Incidental finding on routine cholecystectomy. PET scan results from July 13, 2020 as well as MRI results from July 20, 2020 reviewed independently with right lobe liver lesion consistent with intrahepatic cholangiocarcinoma.  Biopsy confirmed the results.  Gynecologic work-up is essentially negative.  Colonoscopy on June 15, 2020 was essentially within normal limits.  Mammogram on July 19, 2020 was reported as BI-RADS 2. All patient tumor markers are negative.  Patient has declined port placement at this time.  Plan  to give cisplatin and gemcitabine on day 1 with gemcitabine only on day 8.  This will be a 21-day cycle and will reimage after four cycles.  Patient received cycle 2, day 1 on September 21, 2020, but then secondary to acute renal failure and positive COVID test treatment has been delayed.  Delay cycle 3, day 8 of treatment secondary to neutropenia.  Plan to eliminate this treatment from her plan and patient will return to clinic in 2 weeks for consideration of cycle 4, day 1.  Will reimage with PET scan prior to next treatment.   2.  Sinus congestion: Resolved.  Possibly related to COVID infection. 3.  Diarrhea: Resolved. 4.  Neutropenia: Delay treatment as above. 5.  Anemia: Hemoglobin continues to slowly trend down is now 8.3, monitor. 6.  Thrombocytosis: Resolved. 7.  Flank  pain: Patient does not complain of this today.  Likely secondary to malignancy.  Abdominal ultrasound was unrevealing.   8.  Elevated liver enzymes: Chronic and unchanged. 9.  Acute renal failure: Resolved.  Patient expressed understanding and was in agreement with this plan. She also understands that She can call clinic at any time with any questions, concerns, or complaints.    Cancer Staging Intrahepatic cholangiocarcinoma ([place]) Staging form: Intrahepatic Bile Duct, AJCC 8th Edition - Clinical stage from 08/04/2020: Stage IV (cT1b, cN0, pM1) - Signed by Lloyd Huger, MD on 08/04/2020   Lloyd Huger, MD   10/26/2020 12:48 PM

## 2020-10-26 ENCOUNTER — Other Ambulatory Visit: Payer: BC Managed Care – PPO

## 2020-10-26 ENCOUNTER — Encounter: Payer: Self-pay | Admitting: Oncology

## 2020-10-26 ENCOUNTER — Inpatient Hospital Stay: Payer: BC Managed Care – PPO

## 2020-10-26 ENCOUNTER — Telehealth: Payer: Self-pay | Admitting: *Deleted

## 2020-10-26 ENCOUNTER — Telehealth: Payer: Self-pay

## 2020-10-26 ENCOUNTER — Inpatient Hospital Stay: Payer: BC Managed Care – PPO | Attending: Oncology | Admitting: Oncology

## 2020-10-26 ENCOUNTER — Other Ambulatory Visit: Payer: Self-pay

## 2020-10-26 ENCOUNTER — Ambulatory Visit: Payer: BC Managed Care – PPO

## 2020-10-26 ENCOUNTER — Ambulatory Visit: Payer: BC Managed Care – PPO | Admitting: Oncology

## 2020-10-26 VITALS — BP 139/74 | HR 71 | Temp 97.8°F | Resp 18 | Wt 151.7 lb

## 2020-10-26 DIAGNOSIS — D649 Anemia, unspecified: Secondary | ICD-10-CM | POA: Diagnosis not present

## 2020-10-26 DIAGNOSIS — Z79899 Other long term (current) drug therapy: Secondary | ICD-10-CM | POA: Insufficient documentation

## 2020-10-26 DIAGNOSIS — D709 Neutropenia, unspecified: Secondary | ICD-10-CM | POA: Insufficient documentation

## 2020-10-26 DIAGNOSIS — R948 Abnormal results of function studies of other organs and systems: Secondary | ICD-10-CM | POA: Insufficient documentation

## 2020-10-26 DIAGNOSIS — Z7984 Long term (current) use of oral hypoglycemic drugs: Secondary | ICD-10-CM | POA: Insufficient documentation

## 2020-10-26 DIAGNOSIS — Z801 Family history of malignant neoplasm of trachea, bronchus and lung: Secondary | ICD-10-CM | POA: Insufficient documentation

## 2020-10-26 DIAGNOSIS — E119 Type 2 diabetes mellitus without complications: Secondary | ICD-10-CM | POA: Diagnosis not present

## 2020-10-26 DIAGNOSIS — C221 Intrahepatic bile duct carcinoma: Secondary | ICD-10-CM

## 2020-10-26 DIAGNOSIS — Z5111 Encounter for antineoplastic chemotherapy: Secondary | ICD-10-CM | POA: Diagnosis not present

## 2020-10-26 LAB — CBC WITH DIFFERENTIAL/PLATELET
Abs Immature Granulocytes: 0 10*3/uL (ref 0.00–0.07)
Basophils Absolute: 0 10*3/uL (ref 0.0–0.1)
Basophils Relative: 1 %
Eosinophils Absolute: 0 10*3/uL (ref 0.0–0.5)
Eosinophils Relative: 1 %
HCT: 25.7 % — ABNORMAL LOW (ref 36.0–46.0)
Hemoglobin: 8.3 g/dL — ABNORMAL LOW (ref 12.0–15.0)
Lymphocytes Relative: 67 %
Lymphs Abs: 1.7 10*3/uL (ref 0.7–4.0)
MCH: 24.8 pg — ABNORMAL LOW (ref 26.0–34.0)
MCHC: 32.3 g/dL (ref 30.0–36.0)
MCV: 76.7 fL — ABNORMAL LOW (ref 80.0–100.0)
Monocytes Absolute: 0.2 10*3/uL (ref 0.1–1.0)
Monocytes Relative: 8 %
Neutro Abs: 0.6 10*3/uL — ABNORMAL LOW (ref 1.7–7.7)
Neutrophils Relative %: 23 %
Platelets: 176 10*3/uL (ref 150–400)
RBC: 3.35 MIL/uL — ABNORMAL LOW (ref 3.87–5.11)
RDW: 21.2 % — ABNORMAL HIGH (ref 11.5–15.5)
Smear Review: NORMAL
WBC: 2.5 10*3/uL — ABNORMAL LOW (ref 4.0–10.5)
nRBC: 0 % (ref 0.0–0.2)

## 2020-10-26 LAB — COMPREHENSIVE METABOLIC PANEL
ALT: 57 U/L — ABNORMAL HIGH (ref 0–44)
AST: 63 U/L — ABNORMAL HIGH (ref 15–41)
Albumin: 3.4 g/dL — ABNORMAL LOW (ref 3.5–5.0)
Alkaline Phosphatase: 74 U/L (ref 38–126)
Anion gap: 10 (ref 5–15)
BUN: 11 mg/dL (ref 6–20)
CO2: 26 mmol/L (ref 22–32)
Calcium: 8.8 mg/dL — ABNORMAL LOW (ref 8.9–10.3)
Chloride: 101 mmol/L (ref 98–111)
Creatinine, Ser: 0.68 mg/dL (ref 0.44–1.00)
GFR, Estimated: >60 mL/min (ref >60)
Glucose, Bld: 107 mg/dL — ABNORMAL HIGH (ref 70–99)
Potassium: 4.1 mmol/L (ref 3.5–5.1)
Sodium: 137 mmol/L (ref 135–145)
Total Bilirubin: 0.4 mg/dL (ref 0.3–1.2)
Total Protein: 7.9 g/dL (ref 6.5–8.1)

## 2020-10-26 NOTE — Telephone Encounter (Signed)
Nutrition Assessment   Reason for Assessment:  Referral from inpatient RD team   ASSESSMENT: 60 year old female with stage IV intrahepatic cholangiocarcinoma.  Patient receiving cisplatin and gemcitabine.  Past medical history of anemia, DM.  Noted recent hospital admission for AKI.   Husband returned RD's call.  RD was able to talk with patient and husband.  Patient appetite is good and eating less at one time but eating more frequently.  Patient reports that she eats breakfast around 5:30am of eggs, flour tortillas, coffee, fruit. Around 7am makes green smoothie using green juice, apples, celery, cucumbers, ginger.  Lunch maybe steak or spaghetti or rice with beans and vegetable and jello. Around 4pm will have fruit or peanut butter and crackers. Supper is beans and rice or meat, soup.  Likes soy milk and water.    Denies nausea.   Medications: reviewed   Labs: glucose 107   Anthropometrics:   Weight 151 lb today decreased from 153 lb on 1/27 158 lb on 1/20 167 lb on 07/14/20  10% weight loss in the last 3 months   Estimated Energy Needs  Kcals: 2000-2380 Protein: 100-119 g Fluid: > 2 L   NUTRITION DIAGNOSIS: Inadequate oral intake related to cancer and cancer related treatment side effects as well as hospital admission as evidenced by 10% weight loss in the last 3 months and decreased intake    INTERVENTION:  Patient and husband interested in lower sugar options for nutritional shakes  Encouraged small frequent meals Discussed good sources of protein. Encouraged weight maintenance   MONITORING, EVALUATION, GOAL: weight trends, intake   Next Visit: Feb 17 during infusion  Rosaleen Mazer B. Zenia Resides, [place], [place] Registered Dietitian [phone] (mobile)

## 2020-10-26 NOTE — Telephone Encounter (Signed)
Patient's husband called to say that he does not receive text messages and thought someone from your office was trying to contact him.

## 2020-10-26 NOTE — Progress Notes (Signed)
Nutrition  Referral received from inpatient RD.    RD planning to see patient during infusion but infusion cancelled for today.    RD called patient but unable to reach her via phone and no option to leave voicemail.  RD to see patient during next infusion.   Arlis Everly B. Zenia Resides, [place], [place] Registered Dietitian [phone] (mobile)

## 2020-10-26 NOTE — Progress Notes (Signed)
Pt for follow up and treatment. Reports taking longer after treatments to feel well, states feeling fatigued longer period of time.

## 2020-10-26 NOTE — Progress Notes (Unsigned)
Pt evaluated by MD. No tx today as anc is 0.6 . Pt aware. Reviewed neutropenic precautions with pt. Return as scheduled per MD. Pt discharged stable.

## 2020-10-31 ENCOUNTER — Telehealth: Payer: Self-pay | Admitting: Oncology

## 2020-10-31 NOTE — Telephone Encounter (Signed)
Pt's husband left vm stating they received a call today but was unable to get to the phone.  I do not see any telephone notes or appointments scheduled today, routing to team for follow up.

## 2020-11-04 NOTE — Progress Notes (Signed)
[facility]  [phone] Fax:(336) [phone]  ID: Lauren Lindsey OB: [birth year]  MR#: [id number]  PJA#:[id number]  Patient Care Team: [name], MD as PCP - General (Family Medicine) Patient, No Pcp Per (General Practice) [name], RN as Oncology Nurse Navigator   CHIEF COMPLAINT: Stage IV intrahepatic cholangiocarcinoma.  INTERVAL HISTORY: Patient returns to clinic today for further evaluation and consideration of cycle 4, day 1 of cisplatin and gemcitabine.  Cycle 3, day 8 of treatment was skipped secondary to neutropenia.  Patient feels improved and back to her baseline.  PET scan was not completed secondary to insurance purposes. She does not complain of pain today.  She has no neurologic complaints.  She has no chest pain, shortness of breath, cough, or hemoptysis.  She denies any nausea, vomiting, constipation, or diarrhea.  She has no melena or hematochezia.  She has no urinary complaints.  Patient offers no specific complaints today.  REVIEW OF SYSTEMS:   Review of Systems  Constitutional: Negative.  Negative for fever, malaise/fatigue and weight loss.  HENT: Negative.  Negative for congestion and sinus pain.   Respiratory: Negative.  Negative for cough, hemoptysis and shortness of breath.   Cardiovascular: Negative.  Negative for chest pain and leg swelling.  Gastrointestinal: Negative.  Negative for abdominal pain, blood in stool, constipation, diarrhea, melena, nausea and vomiting.  Genitourinary: Negative.  Negative for dysuria and flank pain.  Musculoskeletal: Negative.  Negative for back pain.  Skin: Negative.  Negative for rash.  Neurological: Negative.  Negative for dizziness, focal weakness, weakness and headaches.  Psychiatric/Behavioral: Negative.  The patient is not nervous/anxious.     As per HPI. Otherwise, a complete review of systems is negative.  PAST MEDICAL HISTORY: Past Medical History:  Diagnosis Date  .  Abnormal Pap smear of cervix   . Anemia    h/o  . Diabetes mellitus without complication ([place])     PAST SURGICAL HISTORY: Past Surgical History:  Procedure Laterality Date  . COLONOSCOPY WITH PROPOFOL N/A 06/15/2020   Procedure: COLONOSCOPY WITH PROPOFOL;  Surgeon: Lin Landsman, MD;  Location: [facility];  Service: Endoscopy;  Laterality: N/A;  Diabetic - oral meds  . ENDOMETRIAL ABLATION    . HYSTEROSCOPY WITH D & C    . LEEP      FAMILY HISTORY: Family History  Problem Relation Age of Onset  . Bleeding Disorder Mother   . Biliary Cirrhosis Mother   . Liver cancer Mother   . Hypertension Mother   . Diabetes Father   . Lung cancer Father   . Breast cancer Neg Hx     ADVANCED DIRECTIVES (Y/N):  N  HEALTH MAINTENANCE: Social History   Tobacco Use  . Smoking status: Never Smoker  . Smokeless tobacco: Never Used  Vaping Use  . Vaping Use: Never used  Substance Use Topics  . Alcohol use: No  . Drug use: No     Colonoscopy:  PAP:  Bone density:  Lipid panel:  No Known Allergies  Current Outpatient Medications  Medication Sig Dispense Refill  . acetaminophen (TYLENOL) 500 MG tablet Take 500 mg by mouth every 6 (six) hours as needed. (Patient not taking: No sig reported)    . ferrous sulfate 325 (65 FE) MG tablet Take 1 tablet (325 mg total) by mouth 2 (two) times daily with a meal. (Patient not taking: No sig reported) 60 tablet 0  . HYDROcodone-acetaminophen (NORCO/VICODIN) 5-325 MG tablet Take 1 tablet  by mouth every 4 (four) hours as needed for moderate pain. (Patient not taking: No sig reported) 60 tablet 0  . metFORMIN (GLUCOPHAGE) 500 MG tablet Take 500 mg by mouth 2 (two) times daily. (Patient not taking: No sig reported)    . ondansetron (ZOFRAN) 8 MG tablet Take 1 tablet (8 mg total) by mouth every 8 (eight) hours as needed for nausea or vomiting. (Patient not taking: No sig reported) 30 tablet 2   No current facility-administered medications  for this visit.    OBJECTIVE: Vitals:   11/09/20 0905  BP: (!) 128/92  Pulse: 67  Resp: 18  Temp: 97.6 F (36.4 C)     Body mass index is 26.46 kg/m.    ECOG FS:0 - Asymptomatic   General: Well-developed, well-nourished, no acute distress. Eyes: Pink conjunctiva, anicteric sclera. HEENT: Normocephalic, moist mucous membranes. Lungs: No audible wheezing or coughing. Heart: Regular rate and rhythm. Abdomen: Soft, nontender, no obvious distention. Musculoskeletal: No edema, cyanosis, or clubbing. Neuro: Alert, answering all questions appropriately. Cranial nerves grossly intact. Skin: No rashes or petechiae noted. Psych: Normal affect.   LAB RESULTS:  Lab Results  Component Value Date   NA 135 11/09/2020   K 3.6 11/09/2020   CL 100 11/09/2020   CO2 25 11/09/2020   GLUCOSE 94 11/09/2020   BUN 10 11/09/2020   CREATININE 0.71 11/09/2020   CALCIUM 8.6 (L) 11/09/2020   PROT 7.7 11/09/2020   ALBUMIN 3.5 11/09/2020   AST 71 (H) 11/09/2020   ALT 50 (H) 11/09/2020   ALKPHOS 66 11/09/2020   BILITOT 0.6 11/09/2020   GFRNONAA >60 11/09/2020   GFRAA >60 05/23/2020    Lab Results  Component Value Date   WBC 4.5 11/09/2020   NEUTROABS 1.9 11/09/2020   HGB 9.7 (L) 11/09/2020   HCT 30.6 (L) 11/09/2020   MCV 83.8 11/09/2020   PLT 616 (H) 11/09/2020     STUDIES: No results found.  ASSESSMENT: Stage IV intrahepatic cholangiocarcinoma.  PLAN:    1.  Stage IV intrahepatic cholangiocarcinoma: Incidental finding on routine cholecystectomy. PET scan results from July 13, 2020 as well as MRI results from July 20, 2020 reviewed independently with right lobe liver lesion consistent with intrahepatic cholangiocarcinoma.  Biopsy confirmed the results.  Gynecologic work-up is essentially negative.  Colonoscopy on June 15, 2020 was essentially within normal limits.  Mammogram on July 19, 2020 was reported as BI-RADS 2. All patient tumor markers are negative.  Patient has  declined port placement at this time.  Plan to give cisplatin and gemcitabine on day 1 with gemcitabine only on day 8.  This will be a 21-day cycle and will reimage after four cycles.  Patient received cycle 2, day 1 on September 21, 2020, but then secondary to acute renal failure and positive COVID test treatment has been delayed.  Cycle 3, day 8 was skipped secondary to neutropenia.  Proceed with cycle 4, day 1 today.  Return to clinic in 1 week for further evaluation and consideration of cycle 4, day 8.  PET scan was denied by insurance, therefore will reimage with MRI prior to cycle 5.  2.  Sinus congestion: Resolved.  Possibly related to COVID infection. 3.  Diarrhea: Resolved. 4.  Neutropenia: Resolved.  Proceed with treatment as above. 5.  Anemia: Hemoglobin improved to 9.7, monitor. 6.  Thrombocytosis: Reactive, monitor. 7.  Flank pain: Patient does not complain of this today.  Likely secondary to malignancy.  Abdominal ultrasound was unrevealing.  8.  Elevated liver enzymes: Chronic and unchanged. 9.  Acute renal failure: Resolved.  Patient expressed understanding and was in agreement with this plan. She also understands that She can call clinic at any time with any questions, concerns, or complaints.    Cancer Staging Intrahepatic cholangiocarcinoma ([place]) Staging form: Intrahepatic Bile Duct, AJCC 8th Edition - Clinical stage from 08/04/2020: Stage IV (cT1b, cN0, pM1) - Signed by Lloyd Huger, MD on 08/04/2020   Lloyd Huger, MD   11/10/2020 6:23 AM

## 2020-11-08 ENCOUNTER — Ambulatory Visit: Payer: BC Managed Care – PPO

## 2020-11-09 ENCOUNTER — Inpatient Hospital Stay: Payer: BC Managed Care – PPO | Admitting: Oncology

## 2020-11-09 ENCOUNTER — Encounter: Payer: Self-pay | Admitting: Oncology

## 2020-11-09 ENCOUNTER — Inpatient Hospital Stay: Payer: BC Managed Care – PPO

## 2020-11-09 VITALS — BP 144/87 | HR 59

## 2020-11-09 VITALS — BP 128/92 | HR 67 | Temp 97.6°F | Resp 18 | Wt 159.0 lb

## 2020-11-09 DIAGNOSIS — C221 Intrahepatic bile duct carcinoma: Secondary | ICD-10-CM

## 2020-11-09 LAB — CBC WITH DIFFERENTIAL/PLATELET
Abs Immature Granulocytes: 0.02 10*3/uL (ref 0.00–0.07)
Basophils Absolute: 0.1 10*3/uL (ref 0.0–0.1)
Basophils Relative: 1 %
Eosinophils Absolute: 0.2 10*3/uL (ref 0.0–0.5)
Eosinophils Relative: 4 %
HCT: 30.6 % — ABNORMAL LOW (ref 36.0–46.0)
Hemoglobin: 9.7 g/dL — ABNORMAL LOW (ref 12.0–15.0)
Immature Granulocytes: 0 %
Lymphocytes Relative: 33 %
Lymphs Abs: 1.5 10*3/uL (ref 0.7–4.0)
MCH: 26.6 pg (ref 26.0–34.0)
MCHC: 31.7 g/dL (ref 30.0–36.0)
MCV: 83.8 fL (ref 80.0–100.0)
Monocytes Absolute: 0.8 10*3/uL (ref 0.1–1.0)
Monocytes Relative: 18 %
Neutro Abs: 1.9 10*3/uL (ref 1.7–7.7)
Neutrophils Relative %: 44 %
Platelets: 616 10*3/uL — ABNORMAL HIGH (ref 150–400)
RBC: 3.65 MIL/uL — ABNORMAL LOW (ref 3.87–5.11)
RDW: 27 % — ABNORMAL HIGH (ref 11.5–15.5)
WBC: 4.5 10*3/uL (ref 4.0–10.5)
nRBC: 0 % (ref 0.0–0.2)

## 2020-11-09 LAB — COMPREHENSIVE METABOLIC PANEL
ALT: 50 U/L — ABNORMAL HIGH (ref 0–44)
AST: 71 U/L — ABNORMAL HIGH (ref 15–41)
Albumin: 3.5 g/dL (ref 3.5–5.0)
Alkaline Phosphatase: 66 U/L (ref 38–126)
Anion gap: 10 (ref 5–15)
BUN: 10 mg/dL (ref 6–20)
CO2: 25 mmol/L (ref 22–32)
Calcium: 8.6 mg/dL — ABNORMAL LOW (ref 8.9–10.3)
Chloride: 100 mmol/L (ref 98–111)
Creatinine, Ser: 0.71 mg/dL (ref 0.44–1.00)
GFR, Estimated: >60 mL/min (ref >60)
Glucose, Bld: 94 mg/dL (ref 70–99)
Potassium: 3.6 mmol/L (ref 3.5–5.1)
Sodium: 135 mmol/L (ref 135–145)
Total Bilirubin: 0.6 mg/dL (ref 0.3–1.2)
Total Protein: 7.7 g/dL (ref 6.5–8.1)

## 2020-11-09 MED ORDER — SODIUM CHLORIDE 0.9 % IV SOLN
Freq: Once | INTRAVENOUS | Status: DC
  Filled 2020-11-09: qty 250

## 2020-11-09 MED ORDER — SODIUM CHLORIDE 0.9 % IV SOLN
Freq: Once | INTRAVENOUS | Status: AC
  Filled 2020-11-09: qty 250

## 2020-11-09 MED ORDER — SODIUM CHLORIDE 0.9 % IV SOLN
1800.0000 mg | Freq: Once | INTRAVENOUS | Status: AC
  Administered 2020-11-09: 1800 mg via INTRAVENOUS
  Filled 2020-11-09: qty 26.3

## 2020-11-09 MED ORDER — MAGNESIUM SULFATE 2 GM/50ML IV SOLN
2.0000 g | Freq: Once | INTRAVENOUS | Status: AC
  Administered 2020-11-09: 2 g via INTRAVENOUS
  Filled 2020-11-09: qty 50

## 2020-11-09 MED ORDER — CISPLATIN CHEMO INJECTION 100MG/100ML
80.0000 mg/m2 | Freq: Once | INTRAVENOUS | Status: AC
  Administered 2020-11-09: 148 mg via INTRAVENOUS
  Filled 2020-11-09: qty 100

## 2020-11-09 MED ORDER — SODIUM CHLORIDE 0.9 % IV SOLN
150.0000 mg | Freq: Once | INTRAVENOUS | Status: AC
  Administered 2020-11-09: 150 mg via INTRAVENOUS
  Filled 2020-11-09: qty 150

## 2020-11-09 MED ORDER — SODIUM CHLORIDE 0.9 % IV SOLN: 1000.0000 mg/m2 | Freq: Once | INTRAVENOUS | Status: DC

## 2020-11-09 MED ORDER — PALONOSETRON HCL INJECTION 0.25 MG/5ML
0.2500 mg | Freq: Once | INTRAVENOUS | Status: AC
  Administered 2020-11-09: 0.25 mg via INTRAVENOUS
  Filled 2020-11-09: qty 5

## 2020-11-09 MED ORDER — POTASSIUM CHLORIDE IN NACL 20-0.9 MEQ/L-% IV SOLN
Freq: Once | INTRAVENOUS | Status: AC
  Filled 2020-11-09: qty 1000

## 2020-11-09 MED ORDER — HEPARIN SOD (PORK) LOCK FLUSH 100 UNIT/ML IV SOLN
500.0000 [IU] | Freq: Once | INTRAVENOUS | Status: DC | PRN
  Filled 2020-11-09: qty 5

## 2020-11-09 MED ORDER — SODIUM CHLORIDE 0.9 % IV SOLN
10.0000 mg | Freq: Once | INTRAVENOUS | Status: AC
  Administered 2020-11-09: 10 mg via INTRAVENOUS
  Filled 2020-11-09: qty 10

## 2020-11-09 MED ORDER — PROCHLORPERAZINE MALEATE 10 MG PO TABS: 10.0000 mg | ORAL_TABLET | Freq: Once | ORAL | Status: DC

## 2020-11-09 NOTE — Progress Notes (Unsigned)
Pt is in for follow up and treatment today.  Denies any concerns today.  Husband accompanying pt.

## 2020-11-09 NOTE — Progress Notes (Signed)
2.17.22 should be Day 1 cycle 4 per MD.  Added orders of cisplatin/premeds to go with gemzar today per MD

## 2020-11-09 NOTE — Progress Notes (Signed)
Nutrition Follow-up:  Patient with stage IV intrahepatic cholangiocarcinoma.  Patient receiving cisplatin and gemcitabine.    Met with patient in infusion.  Patient reports that appetite is good.  Eats breakfast about 5:30am usually corn tortilla with eggs, spinach and coffee.  9am has green juice (smoothie). Lunch is rice with vegetables, liver or other meat.  Afternoon snack of fruit usually. Dinner around 6pm spaghetti or rice vegetables, beans/meat.  Sometimes bedtime snack of peanut butter crackers. Patient has not been drinking oral nutrition supplements.   Denies any nutrition impact symptoms.      Medications: reviewed  Labs: reviewed  Anthropometrics:   Weight 159 lb increased today from 151 lb on 2/3  153 lb on 1/27   NUTRITION DIAGNOSIS: Inadequate oral intake improved   INTERVENTION:  Encouraged well-balanced diet including good sources of protein.  Encouraged monitoring blood glucose closely  Contact information provided    MONITORING, EVALUATION, GOAL: weight trends, intake   NEXT VISIT: Mar 10 during infusion  Tonny Isensee B. Zenia Resides, [place], [place] Registered Dietitian [phone] (mobile)

## 2020-11-12 NOTE — Progress Notes (Unsigned)
[facility]  Telephone:(336) [phone] Fax:(336) [phone]  ID: Lauren Lindsey OB: 1960/12/25  MR#: [id number]  ZRA#:[id number]  Patient Care Team: [name], MD as PCP - General (Family Medicine) Patient, No Pcp Per (General Practice) [name], RN as Oncology Nurse Navigator   CHIEF COMPLAINT: Stage IV intrahepatic cholangiocarcinoma.  INTERVAL HISTORY: Patient returns to clinic today for further evaluation and consideration of cycle 4-day 8 of cisplatin and gemcitabine.  Gemcitabine only.  She had increased weakness and fatigue for 4 to 5 days after her last treatment, but now feels improved and nearly back to her baseline. PET scan was not completed secondary to insurance purposes. She does not complain of pain today.  She has no neurologic complaints.  She has no chest pain, shortness of breath, cough, or hemoptysis.  She denies any nausea, vomiting, constipation, or diarrhea.  She has no melena or hematochezia.  She has no urinary complaints.  Patient offers no further specific complaints today.  REVIEW OF SYSTEMS:   Review of Systems  Constitutional: Positive for malaise/fatigue. Negative for fever and weight loss.  HENT: Negative.  Negative for congestion and sinus pain.   Respiratory: Negative.  Negative for cough, hemoptysis and shortness of breath.   Cardiovascular: Negative.  Negative for chest pain and leg swelling.  Gastrointestinal: Negative.  Negative for abdominal pain, blood in stool, constipation, diarrhea, melena, nausea and vomiting.  Genitourinary: Negative.  Negative for dysuria and flank pain.  Musculoskeletal: Negative.  Negative for back pain.  Skin: Negative.  Negative for rash.  Neurological: Positive for weakness. Negative for dizziness, focal weakness and headaches.  Psychiatric/Behavioral: Negative.  The patient is not nervous/anxious.     As per HPI. Otherwise, a complete review of systems is negative.  PAST MEDICAL  HISTORY: Past Medical History:  Diagnosis Date  . Abnormal Pap smear of cervix   . Anemia    h/o  . Diabetes mellitus without complication ([place])     PAST SURGICAL HISTORY: Past Surgical History:  Procedure Laterality Date  . COLONOSCOPY WITH PROPOFOL N/A 06/15/2020   Procedure: COLONOSCOPY WITH PROPOFOL;  Surgeon: Lin Landsman, MD;  Location: [facility];  Service: Endoscopy;  Laterality: N/A;  Diabetic - oral meds  . ENDOMETRIAL ABLATION    . HYSTEROSCOPY WITH D & C    . LEEP      FAMILY HISTORY: Family History  Problem Relation Age of Onset  . Bleeding Disorder Mother   . Biliary Cirrhosis Mother   . Liver cancer Mother   . Hypertension Mother   . Diabetes Father   . Lung cancer Father   . Breast cancer Neg Hx     ADVANCED DIRECTIVES (Y/N):  N  HEALTH MAINTENANCE: Social History   Tobacco Use  . Smoking status: Never Smoker  . Smokeless tobacco: Never Used  Vaping Use  . Vaping Use: Never used  Substance Use Topics  . Alcohol use: No  . Drug use: No     Colonoscopy:  PAP:  Bone density:  Lipid panel:  No Known Allergies  Current Outpatient Medications  Medication Sig Dispense Refill  . metFORMIN (GLUCOPHAGE) 500 MG tablet Take 500 mg by mouth 2 (two) times daily.    Marland Kitchen acetaminophen (TYLENOL) 500 MG tablet Take 500 mg by mouth every 6 (six) hours as needed. (Patient not taking: No sig reported)    . ferrous sulfate 325 (65 FE) MG tablet Take 1 tablet (325 mg total) by mouth 2 (  two) times daily with a meal. (Patient not taking: No sig reported) 60 tablet 0  . HYDROcodone-acetaminophen (NORCO/VICODIN) 5-325 MG tablet Take 1 tablet by mouth every 4 (four) hours as needed for moderate pain. (Patient not taking: No sig reported) 60 tablet 0  . ondansetron (ZOFRAN) 8 MG tablet Take 1 tablet (8 mg total) by mouth every 8 (eight) hours as needed for nausea or vomiting. (Patient not taking: No sig reported) 30 tablet 2   No current  facility-administered medications for this visit.    OBJECTIVE: Vitals:   11/16/20 1321  BP: 140/71  Pulse: 72  Resp: 16  Temp: 99.5 F (37.5 C)  SpO2: 100%     Body mass index is 26.13 kg/m.    ECOG FS:0 - Asymptomatic   General: Well-developed, well-nourished, no acute distress. Eyes: Pink conjunctiva, anicteric sclera. HEENT: Normocephalic, moist mucous membranes. Lungs: No audible wheezing or coughing. Heart: Regular rate and rhythm. Abdomen: Soft, nontender, no obvious distention. Musculoskeletal: No edema, cyanosis, or clubbing. Neuro: Alert, answering all questions appropriately. Cranial nerves grossly intact. Skin: No rashes or petechiae noted. Psych: Normal affect.  LAB RESULTS:  Lab Results  Component Value Date   NA 137 11/16/2020   K 3.5 11/16/2020   CL 100 11/16/2020   CO2 25 11/16/2020   GLUCOSE 153 (H) 11/16/2020   BUN 10 11/16/2020   CREATININE 1.01 (H) 11/16/2020   CALCIUM 8.6 (L) 11/16/2020   PROT 7.7 11/16/2020   ALBUMIN 3.8 11/16/2020   AST 60 (H) 11/16/2020   ALT 74 (H) 11/16/2020   ALKPHOS 71 11/16/2020   BILITOT 0.5 11/16/2020   GFRNONAA >60 11/16/2020   GFRAA >60 05/23/2020    Lab Results  Component Value Date   WBC 2.1 (L) 11/16/2020   NEUTROABS 0.7 (L) 11/16/2020   HGB 9.5 (L) 11/16/2020   HCT 30.2 (L) 11/16/2020   MCV 85.1 11/16/2020   PLT 292 11/16/2020     STUDIES: No results found.  ASSESSMENT: Stage IV intrahepatic cholangiocarcinoma.  PLAN:    1.  Stage IV intrahepatic cholangiocarcinoma: Incidental finding on routine cholecystectomy. PET scan results from July 13, 2020 as well as MRI results from July 20, 2020 reviewed independently with right lobe liver lesion consistent with intrahepatic cholangiocarcinoma.  Biopsy confirmed the results.  Gynecologic work-up is essentially negative.  Colonoscopy on June 15, 2020 was essentially within normal limits.  Mammogram on July 19, 2020 was reported as BI-RADS 2.  All patient tumor markers are negative.  Patient has declined port placement at this time.  Plan to give cisplatin and gemcitabine on day 1 with gemcitabine only on day 8.  This will be a 21-day cycle and will reimage after four cycles.  Patient received cycle 2, day 1 on September 21, 2020, but then secondary to acute renal failure and positive COVID test treatment has been delayed.  Cycle 3, day 8 was skipped secondary to neutropenia.  Despite persistent neutropenia we will proceed with cycle 4, day 8 of gemcitabine only today.  Pending insurance approval patient will return to clinic tomorrow to receive Ziextenzo.  Will also include Ziextenzo on day 8 of treatment for the remainder of her cycles.  Patient has an MRI scheduled for November 27, 2020 and then will return to clinic on November 30, 2020 for further evaluation and discussion of her imaging results and consideration of cycle 5, day 1.   2.  Sinus congestion: Resolved.  Possibly related to COVID infection. 3.  Diarrhea: Resolved.  4.  Neutropenia: Recurrent.  Proceed cautiously with treatment above return to clinic tomorrow for Ziextenzo pending insurance approval. 5.  Anemia: Chronic and unchanged.  Patient's hemoglobin is 9.5 today. 6.  Thrombocytosis: Resolved. 7.  Flank pain: Patient does not complain of this today.  Likely secondary to malignancy.  Abdominal ultrasound was unrevealing.   8.  Elevated liver enzymes: Chronic and unchanged. 9.  Acute renal failure: Resolved.  Patient expressed understanding and was in agreement with this plan. She also understands that She can call clinic at any time with any questions, concerns, or complaints.    Cancer Staging Intrahepatic cholangiocarcinoma ([place]) Staging form: Intrahepatic Bile Duct, AJCC 8th Edition - Clinical stage from 08/04/2020: Stage IV (cT1b, cN0, pM1) - Signed by Lloyd Huger, MD on 08/04/2020   Lloyd Huger, MD   11/16/2020 2:20 PM

## 2020-11-16 ENCOUNTER — Inpatient Hospital Stay: Payer: BC Managed Care – PPO

## 2020-11-16 ENCOUNTER — Inpatient Hospital Stay (HOSPITAL_BASED_OUTPATIENT_CLINIC_OR_DEPARTMENT_OTHER): Payer: BC Managed Care – PPO | Admitting: Oncology

## 2020-11-16 ENCOUNTER — Other Ambulatory Visit: Payer: Self-pay | Admitting: Oncology

## 2020-11-16 ENCOUNTER — Encounter: Payer: Self-pay | Admitting: Oncology

## 2020-11-16 VITALS — BP 140/71 | HR 72 | Temp 99.5°F | Resp 16 | Ht 65.0 in | Wt 157.0 lb

## 2020-11-16 DIAGNOSIS — C221 Intrahepatic bile duct carcinoma: Secondary | ICD-10-CM

## 2020-11-16 LAB — COMPREHENSIVE METABOLIC PANEL
ALT: 74 U/L — ABNORMAL HIGH (ref 0–44)
AST: 60 U/L — ABNORMAL HIGH (ref 15–41)
Albumin: 3.8 g/dL (ref 3.5–5.0)
Alkaline Phosphatase: 71 U/L (ref 38–126)
Anion gap: 12 (ref 5–15)
BUN: 10 mg/dL (ref 6–20)
CO2: 25 mmol/L (ref 22–32)
Calcium: 8.6 mg/dL — ABNORMAL LOW (ref 8.9–10.3)
Chloride: 100 mmol/L (ref 98–111)
Creatinine, Ser: 1.01 mg/dL — ABNORMAL HIGH (ref 0.44–1.00)
GFR, Estimated: >60 mL/min (ref >60)
Glucose, Bld: 153 mg/dL — ABNORMAL HIGH (ref 70–99)
Potassium: 3.5 mmol/L (ref 3.5–5.1)
Sodium: 137 mmol/L (ref 135–145)
Total Bilirubin: 0.5 mg/dL (ref 0.3–1.2)
Total Protein: 7.7 g/dL (ref 6.5–8.1)

## 2020-11-16 LAB — CBC WITH DIFFERENTIAL/PLATELET
Abs Immature Granulocytes: 0.01 10*3/uL (ref 0.00–0.07)
Basophils Absolute: 0 10*3/uL (ref 0.0–0.1)
Basophils Relative: 2 %
Eosinophils Absolute: 0 10*3/uL (ref 0.0–0.5)
Eosinophils Relative: 1 %
HCT: 30.2 % — ABNORMAL LOW (ref 36.0–46.0)
Hemoglobin: 9.5 g/dL — ABNORMAL LOW (ref 12.0–15.0)
Immature Granulocytes: 1 %
Lymphocytes Relative: 54 %
Lymphs Abs: 1.1 10*3/uL (ref 0.7–4.0)
MCH: 26.8 pg (ref 26.0–34.0)
MCHC: 31.5 g/dL (ref 30.0–36.0)
MCV: 85.1 fL (ref 80.0–100.0)
Monocytes Absolute: 0.1 10*3/uL (ref 0.1–1.0)
Monocytes Relative: 6 %
Neutro Abs: 0.7 10*3/uL — ABNORMAL LOW (ref 1.7–7.7)
Neutrophils Relative %: 36 %
Platelets: 292 10*3/uL (ref 150–400)
RBC: 3.55 MIL/uL — ABNORMAL LOW (ref 3.87–5.11)
RDW: 24.5 % — ABNORMAL HIGH (ref 11.5–15.5)
Smear Review: NORMAL
WBC: 2.1 10*3/uL — ABNORMAL LOW (ref 4.0–10.5)
nRBC: 0 % (ref 0.0–0.2)

## 2020-11-16 MED ORDER — SODIUM CHLORIDE 0.9 % IV SOLN
Freq: Once | INTRAVENOUS | Status: AC
  Filled 2020-11-16: qty 250

## 2020-11-16 MED ORDER — PROCHLORPERAZINE MALEATE 10 MG PO TABS
10.0000 mg | ORAL_TABLET | Freq: Once | ORAL | Status: AC
  Administered 2020-11-16: 10 mg via ORAL
  Filled 2020-11-16: qty 1

## 2020-11-16 MED ORDER — GEMCITABINE HCL CHEMO INJECTION 1 GM/26.3ML: 1000.0000 mg/m2 | Freq: Once | INTRAVENOUS | Status: DC

## 2020-11-16 MED ORDER — SODIUM CHLORIDE 0.9 % IV SOLN
1800.0000 mg | Freq: Once | INTRAVENOUS | Status: AC
  Administered 2020-11-16: 1800 mg via INTRAVENOUS
  Filled 2020-11-16: qty 26.3

## 2020-11-16 NOTE — Progress Notes (Signed)
ANC 0.7, Dr. [name] aware, Dr. [name] has seen pt in [facility] clinic. Per Dr. [name] okay to proceed with Gemzar treatment as scheduled despite ANC of 0.7.

## 2020-11-17 ENCOUNTER — Other Ambulatory Visit: Payer: Self-pay

## 2020-11-17 ENCOUNTER — Inpatient Hospital Stay: Payer: BC Managed Care – PPO

## 2020-11-17 DIAGNOSIS — C221 Intrahepatic bile duct carcinoma: Secondary | ICD-10-CM | POA: Diagnosis not present

## 2020-11-17 MED ORDER — PEGFILGRASTIM-BMEZ 6 MG/0.6ML ~~LOC~~ SOSY
6.0000 mg | PREFILLED_SYRINGE | Freq: Once | SUBCUTANEOUS | Status: AC
  Administered 2020-11-17: 6 mg via SUBCUTANEOUS
  Filled 2020-11-17: qty 0.6

## 2020-11-22 ENCOUNTER — Other Ambulatory Visit: Payer: Self-pay | Admitting: *Deleted

## 2020-11-22 DIAGNOSIS — C221 Intrahepatic bile duct carcinoma: Secondary | ICD-10-CM

## 2020-11-22 NOTE — Progress Notes (Signed)
Mri

## 2020-11-25 NOTE — Progress Notes (Signed)
[facility]  Telephone:(336) [phone] Fax:(336) [phone]  ID: Lauren Lindsey OB: 06-15-1961  MR#: [id number]  ZDG#:[id number]  Patient Care Team: [name], MD as PCP - General (Family Medicine) Patient, No Pcp Per (General Practice) [name], RN as Oncology Nurse Navigator   CHIEF COMPLAINT: Stage IV intrahepatic cholangiocarcinoma.  INTERVAL HISTORY: Patient returns to clinic today for further evaluation and consideration of cycle 5, day 1 of cisplatin and gemcitabine.  She currently feels well and is asymptomatic.  Imaging was finally approved by insurance and she has an MRI of her abdomen tomorrow. She does not complain of pain today.  She has no neurologic complaints.  She has no chest pain, shortness of breath, cough, or hemoptysis.  She denies any nausea, vomiting, constipation, or diarrhea.  She has no melena or hematochezia.  She has no urinary complaints.  Patient offers no specific complaints today.  REVIEW OF SYSTEMS:   Review of Systems  Constitutional: Negative.  Negative for fever, malaise/fatigue and weight loss.  HENT: Negative.  Negative for congestion and sinus pain.   Respiratory: Negative.  Negative for cough, hemoptysis and shortness of breath.   Cardiovascular: Negative.  Negative for chest pain and leg swelling.  Gastrointestinal: Negative.  Negative for abdominal pain, blood in stool, constipation, diarrhea, melena, nausea and vomiting.  Genitourinary: Negative.  Negative for dysuria and flank pain.  Musculoskeletal: Negative.  Negative for back pain.  Skin: Negative.  Negative for rash.  Neurological: Negative.  Negative for dizziness, focal weakness, weakness and headaches.  Psychiatric/Behavioral: Negative.  The patient is not nervous/anxious.     As per HPI. Otherwise, a complete review of systems is negative.  PAST MEDICAL HISTORY: Past Medical History:  Diagnosis Date  . Abnormal Pap smear of cervix   . Anemia     h/o  . Diabetes mellitus without complication ([place])     PAST SURGICAL HISTORY: Past Surgical History:  Procedure Laterality Date  . COLONOSCOPY WITH PROPOFOL N/A 06/15/2020   Procedure: COLONOSCOPY WITH PROPOFOL;  Surgeon: Lin Landsman, MD;  Location: [facility];  Service: Endoscopy;  Laterality: N/A;  Diabetic - oral meds  . ENDOMETRIAL ABLATION    . HYSTEROSCOPY WITH D & C    . LEEP      FAMILY HISTORY: Family History  Problem Relation Age of Onset  . Bleeding Disorder Mother   . Biliary Cirrhosis Mother   . Liver cancer Mother   . Hypertension Mother   . Diabetes Father   . Lung cancer Father   . Breast cancer Neg Hx     ADVANCED DIRECTIVES (Y/N):  N  HEALTH MAINTENANCE: Social History   Tobacco Use  . Smoking status: Never Smoker  . Smokeless tobacco: Never Used  Vaping Use  . Vaping Use: Never used  Substance Use Topics  . Alcohol use: No  . Drug use: No     Colonoscopy:  PAP:  Bone density:  Lipid panel:  No Known Allergies  Current Outpatient Medications  Medication Sig Dispense Refill  . metFORMIN (GLUCOPHAGE) 500 MG tablet Take 500 mg by mouth 2 (two) times daily.    Marland Kitchen acetaminophen (TYLENOL) 500 MG tablet Take 500 mg by mouth every 6 (six) hours as needed. (Patient not taking: No sig reported)    . ferrous sulfate 325 (65 FE) MG tablet Take 1 tablet (325 mg total) by mouth 2 (two) times daily with a meal. (Patient not taking: No sig reported) 60 tablet  0  . HYDROcodone-acetaminophen (NORCO/VICODIN) 5-325 MG tablet Take 1 tablet by mouth every 4 (four) hours as needed for moderate pain. (Patient not taking: No sig reported) 60 tablet 0  . ondansetron (ZOFRAN) 8 MG tablet Take 1 tablet (8 mg total) by mouth every 8 (eight) hours as needed for nausea or vomiting. (Patient not taking: No sig reported) 30 tablet 2   No current facility-administered medications for this visit.    OBJECTIVE: Vitals:   11/30/20 0854  BP: 131/68  Pulse:  78  Resp: 18  Temp: 98.3 F (36.8 C)  SpO2: 100%     Body mass index is 26.61 kg/m.    ECOG FS:0 - Asymptomatic   General: Well-developed, well-nourished, no acute distress. Eyes: Pink conjunctiva, anicteric sclera. HEENT: Normocephalic, moist mucous membranes. Lungs: No audible wheezing or coughing. Heart: Regular rate and rhythm. Abdomen: Soft, nontender, no obvious distention. Musculoskeletal: No edema, cyanosis, or clubbing. Neuro: Alert, answering all questions appropriately. Cranial nerves grossly intact. Skin: No rashes or petechiae noted. Psych: Normal affect.  LAB RESULTS:  Lab Results  Component Value Date   NA 136 11/30/2020   K 3.6 11/30/2020   CL 103 11/30/2020   CO2 24 11/30/2020   GLUCOSE 109 (H) 11/30/2020   BUN 12 11/30/2020   CREATININE 0.75 11/30/2020   CALCIUM 8.9 11/30/2020   PROT 7.3 11/30/2020   ALBUMIN 3.6 11/30/2020   AST 130 (H) 11/30/2020   ALT 83 (H) 11/30/2020   ALKPHOS 116 11/30/2020   BILITOT 0.7 11/30/2020   GFRNONAA >60 11/30/2020   GFRAA >60 05/23/2020    Lab Results  Component Value Date   WBC 7.6 11/30/2020   NEUTROABS 4.5 11/30/2020   HGB 9.6 (L) 11/30/2020   HCT 30.1 (L) 11/30/2020   MCV 87.5 11/30/2020   PLT 401 (H) 11/30/2020     STUDIES: No results found.  ASSESSMENT: Stage IV intrahepatic cholangiocarcinoma.  PLAN:    1.  Stage IV intrahepatic cholangiocarcinoma: Incidental finding on routine cholecystectomy. PET scan results from July 13, 2020 as well as MRI results from July 20, 2020 reviewed independently with right lobe liver lesion consistent with intrahepatic cholangiocarcinoma.  Biopsy confirmed the results.  Gynecologic work-up is essentially negative.  Colonoscopy on June 15, 2020 was essentially within normal limits.  Mammogram on July 19, 2020 was reported as BI-RADS 2. All patient tumor markers are negative.  Patient has declined port placement at this time.  Plan to give cisplatin and  gemcitabine on day 1 with gemcitabine only on day 8.  This will be a 21-day cycle.  Because of a history of neutropenia, patient will require Ziextenzo after day 8 of treatment.  Delay cycle 5, day 1 in order to get imaging tomorrow.  Return to clinic in 1 week for further evaluation, discussion of her MRI results, and consideration of cycle 5, day 1. 2.  Sinus congestion: Resolved.  Possibly related to COVID infection. 3.  Diarrhea: Resolved. 4.  Neutropenia: Recurrent.  Continue Ziextenzo after day 8 of each cycle. 5.  Anemia: Chronic and unchanged.  Patient's hemoglobin is 9.4 today. 6.  Thrombocytosis: Mild. 7.  Flank pain: Patient does not complain of this today.  Likely secondary to malignancy.  Abdominal ultrasound was unrevealing.   8.  Elevated liver enzymes: Chronic and unchanged. 9.  Acute renal failure: Resolved.  Patient expressed understanding and was in agreement with this plan. She also understands that She can call clinic at any time with any questions, concerns,  or complaints.    Cancer Staging Intrahepatic cholangiocarcinoma ([place]) Staging form: Intrahepatic Bile Duct, AJCC 8th Edition - Clinical stage from 08/04/2020: Stage IV (cT1b, cN0, pM1) - Signed by Lloyd Huger, MD on 08/04/2020   Lloyd Huger, MD   12/01/2020 6:32 AM

## 2020-11-27 ENCOUNTER — Ambulatory Visit: Admission: RE | Admit: 2020-11-27 | Payer: BC Managed Care – PPO | Source: Ambulatory Visit

## 2020-11-27 ENCOUNTER — Ambulatory Visit: Payer: BC Managed Care – PPO

## 2020-11-27 ENCOUNTER — Other Ambulatory Visit: Payer: BC Managed Care – PPO

## 2020-11-29 ENCOUNTER — Other Ambulatory Visit: Payer: Self-pay | Admitting: Oncology

## 2020-11-29 DIAGNOSIS — C221 Intrahepatic bile duct carcinoma: Secondary | ICD-10-CM

## 2020-11-30 ENCOUNTER — Inpatient Hospital Stay (HOSPITAL_BASED_OUTPATIENT_CLINIC_OR_DEPARTMENT_OTHER): Payer: BC Managed Care – PPO | Admitting: Oncology

## 2020-11-30 ENCOUNTER — Inpatient Hospital Stay: Payer: BC Managed Care – PPO | Attending: Oncology

## 2020-11-30 ENCOUNTER — Encounter: Payer: Self-pay | Admitting: Oncology

## 2020-11-30 ENCOUNTER — Inpatient Hospital Stay: Payer: BC Managed Care – PPO

## 2020-11-30 ENCOUNTER — Other Ambulatory Visit: Payer: Self-pay | Admitting: *Deleted

## 2020-11-30 VITALS — BP 131/68 | HR 78 | Temp 98.3°F | Resp 18 | Wt 159.9 lb

## 2020-11-30 DIAGNOSIS — C221 Intrahepatic bile duct carcinoma: Secondary | ICD-10-CM | POA: Diagnosis present

## 2020-11-30 DIAGNOSIS — D709 Neutropenia, unspecified: Secondary | ICD-10-CM | POA: Insufficient documentation

## 2020-11-30 DIAGNOSIS — Z79899 Other long term (current) drug therapy: Secondary | ICD-10-CM | POA: Diagnosis not present

## 2020-11-30 DIAGNOSIS — Z5111 Encounter for antineoplastic chemotherapy: Secondary | ICD-10-CM | POA: Insufficient documentation

## 2020-11-30 DIAGNOSIS — R948 Abnormal results of function studies of other organs and systems: Secondary | ICD-10-CM | POA: Insufficient documentation

## 2020-11-30 DIAGNOSIS — E119 Type 2 diabetes mellitus without complications: Secondary | ICD-10-CM | POA: Insufficient documentation

## 2020-11-30 DIAGNOSIS — Z7984 Long term (current) use of oral hypoglycemic drugs: Secondary | ICD-10-CM | POA: Diagnosis not present

## 2020-11-30 DIAGNOSIS — Z801 Family history of malignant neoplasm of trachea, bronchus and lung: Secondary | ICD-10-CM | POA: Diagnosis not present

## 2020-11-30 DIAGNOSIS — D75839 Thrombocytosis, unspecified: Secondary | ICD-10-CM | POA: Insufficient documentation

## 2020-11-30 DIAGNOSIS — D649 Anemia, unspecified: Secondary | ICD-10-CM | POA: Diagnosis not present

## 2020-11-30 LAB — CBC WITH DIFFERENTIAL/PLATELET
Abs Immature Granulocytes: 0.14 10*3/uL — ABNORMAL HIGH (ref 0.00–0.07)
Basophils Absolute: 0.1 10*3/uL (ref 0.0–0.1)
Basophils Relative: 1 %
Eosinophils Absolute: 0.1 10*3/uL (ref 0.0–0.5)
Eosinophils Relative: 2 %
HCT: 30.1 % — ABNORMAL LOW (ref 36.0–46.0)
Hemoglobin: 9.6 g/dL — ABNORMAL LOW (ref 12.0–15.0)
Immature Granulocytes: 2 %
Lymphocytes Relative: 22 %
Lymphs Abs: 1.7 10*3/uL (ref 0.7–4.0)
MCH: 27.9 pg (ref 26.0–34.0)
MCHC: 31.9 g/dL (ref 30.0–36.0)
MCV: 87.5 fL (ref 80.0–100.0)
Monocytes Absolute: 1.2 10*3/uL — ABNORMAL HIGH (ref 0.1–1.0)
Monocytes Relative: 15 %
Neutro Abs: 4.5 10*3/uL (ref 1.7–7.7)
Neutrophils Relative %: 58 %
Platelets: 401 10*3/uL — ABNORMAL HIGH (ref 150–400)
RBC: 3.44 MIL/uL — ABNORMAL LOW (ref 3.87–5.11)
RDW: 26.3 % — ABNORMAL HIGH (ref 11.5–15.5)
WBC: 7.6 10*3/uL (ref 4.0–10.5)
nRBC: 0 % (ref 0.0–0.2)

## 2020-11-30 LAB — COMPREHENSIVE METABOLIC PANEL
ALT: 83 U/L — ABNORMAL HIGH (ref 0–44)
AST: 130 U/L — ABNORMAL HIGH (ref 15–41)
Albumin: 3.6 g/dL (ref 3.5–5.0)
Alkaline Phosphatase: 116 U/L (ref 38–126)
Anion gap: 9 (ref 5–15)
BUN: 12 mg/dL (ref 6–20)
CO2: 24 mmol/L (ref 22–32)
Calcium: 8.9 mg/dL (ref 8.9–10.3)
Chloride: 103 mmol/L (ref 98–111)
Creatinine, Ser: 0.75 mg/dL (ref 0.44–1.00)
GFR, Estimated: >60 mL/min (ref >60)
Glucose, Bld: 109 mg/dL — ABNORMAL HIGH (ref 70–99)
Potassium: 3.6 mmol/L (ref 3.5–5.1)
Sodium: 136 mmol/L (ref 135–145)
Total Bilirubin: 0.7 mg/dL (ref 0.3–1.2)
Total Protein: 7.3 g/dL (ref 6.5–8.1)

## 2020-11-30 NOTE — Progress Notes (Signed)
Nutrition Follow-up:  Patient with stage IV intrahepatic cholangiocarcinoma.  Patient receiving chemotherapy.    Infusion cancelled for today so RD unable to see patient during treatment. Called patient to follow-up by phone.  Patient reports that her appetite is good and denies any nutrition impact symptoms.  Continues to eat 3 meals per day and snacks.      Medications: reviewed  Labs: reviewed  Anthropometrics:   Weight 159 lb 14.4 oz today increased from 157 lb on 11/17/19  159 lb 2/17 151 lb on 2/3   NUTRITION DIAGNOSIS: Inadequate oral intake improved   INTERVENTION:  Patient to continue eating well-balanced diet with good sources of protein    MONITORING, EVALUATION, GOAL: weight trends, intake   NEXT VISIT: to be determined with treatment  Joli B. Zenia Resides, [place], [place] Registered Dietitian [phone] (mobile)

## 2020-11-30 NOTE — Progress Notes (Signed)
Patient here for oncology follow-up appointment, expresses no complaints or concerns at this time.    

## 2020-12-01 ENCOUNTER — Ambulatory Visit
Admission: RE | Admit: 2020-12-01 | Discharge: 2020-12-01 | Disposition: A | Discharge status: Home / Self Care | Payer: BC Managed Care – PPO | Source: Ambulatory Visit | Attending: Oncology | Admitting: Oncology

## 2020-12-01 ENCOUNTER — Other Ambulatory Visit: Payer: Self-pay

## 2020-12-01 DIAGNOSIS — C221 Intrahepatic bile duct carcinoma: Secondary | ICD-10-CM | POA: Diagnosis present

## 2020-12-01 MED ORDER — GADOBUTROL 1 MMOL/ML IV SOLN
7.0000 mL | Freq: Once | INTRAVENOUS | Status: AC | PRN
  Administered 2020-12-01: 7 mL via INTRAVENOUS

## 2020-12-02 NOTE — Progress Notes (Unsigned)
[place]  Telephone:(336) [phone] Fax:(336) [phone]  ID: Lauren Lindsey OB: [birth year]  MR#: [id number]  CSN#:[id number]  Patient Care Team: [name], MD as PCP - General (Family Medicine) Patient, No Pcp Per (General Practice) [name], RN as Oncology Nurse Navigator [name], [name], MD as Consulting Physician (Oncology)   CHIEF COMPLAINT: Stage IV intrahepatic cholangiocarcinoma.  INTERVAL HISTORY: Patient returns to clinic today for further evaluation, discussion of her imaging results, and reconsideration of cycle 5, day 1 of cisplatin and gemcitabine.  She continues to feel well and remains asymptomatic. She does not complain of pain today.  She has no neurologic complaints.  She has no chest pain, shortness of breath, cough, or hemoptysis.  She denies any nausea, vomiting, constipation, or diarrhea.  She has no melena or hematochezia.  She has no urinary complaints.  Patient offers no specific complaints today.  REVIEW OF SYSTEMS:   Review of Systems  Constitutional: Negative.  Negative for fever, malaise/fatigue and weight loss.  HENT: Negative.  Negative for congestion and sinus pain.   Respiratory: Negative.  Negative for cough, hemoptysis and shortness of breath.   Cardiovascular: Negative.  Negative for chest pain and leg swelling.  Gastrointestinal: Negative.  Negative for abdominal pain, blood in stool, constipation, diarrhea, melena, nausea and vomiting.  Genitourinary: Negative.  Negative for dysuria and flank pain.  Musculoskeletal: Negative.  Negative for back pain.  Skin: Negative.  Negative for rash.  Neurological: Negative.  Negative for dizziness, focal weakness, weakness and headaches.  Psychiatric/Behavioral: Negative.  The patient is not nervous/anxious.     As per HPI. Otherwise, a complete review of systems is negative.  PAST MEDICAL HISTORY: Past Medical History:  Diagnosis Date  . Abnormal Pap smear of cervix    . Anemia    h/o  . Diabetes mellitus without complication ([place])     PAST SURGICAL HISTORY: Past Surgical History:  Procedure Laterality Date  . COLONOSCOPY WITH PROPOFOL N/A 06/15/2020   Procedure: COLONOSCOPY WITH PROPOFOL;  Surgeon: Lin Landsman, MD;  Location: [facility];  Service: Endoscopy;  Laterality: N/A;  Diabetic - oral meds  . ENDOMETRIAL ABLATION    . HYSTEROSCOPY WITH D & C    . LEEP      FAMILY HISTORY: Family History  Problem Relation Age of Onset  . Bleeding Disorder Mother   . Biliary Cirrhosis Mother   . Liver cancer Mother   . Hypertension Mother   . Diabetes Father   . Lung cancer Father   . Breast cancer Neg Hx     ADVANCED DIRECTIVES (Y/N):  N  HEALTH MAINTENANCE: Social History   Tobacco Use  . Smoking status: Never Smoker  . Smokeless tobacco: Never Used  Vaping Use  . Vaping Use: Never used  Substance Use Topics  . Alcohol use: No  . Drug use: No     Colonoscopy:  PAP:  Bone density:  Lipid panel:  No Known Allergies  Current Outpatient Medications  Medication Sig Dispense Refill  . acetaminophen (TYLENOL) 500 MG tablet Take 500 mg by mouth every 6 (six) hours as needed.    . metFORMIN (GLUCOPHAGE) 500 MG tablet Take 500 mg by mouth 2 (two) times daily.    Marland Kitchen HYDROcodone-acetaminophen (NORCO/VICODIN) 5-325 MG tablet Take 1 tablet by mouth every 4 (four) hours as needed for moderate pain. (Patient not taking: No sig reported) 60 tablet 0  . ondansetron (ZOFRAN) 8 MG tablet Take 1 tablet (  8 mg total) by mouth every 8 (eight) hours as needed for nausea or vomiting. (Patient not taking: No sig reported) 30 tablet 2   No current facility-administered medications for this visit.   Facility-Administered Medications Ordered in Other Visits  Medication Dose Route Frequency Provider Last Rate Last Admin  . 0.9 %  sodium chloride infusion   Intravenous Once Lloyd Huger, MD      . gemcitabine (GEMZAR) 1,800 mg in  sodium chloride 0.9 % 250 mL chemo infusion  1,800 mg Intravenous Once Lloyd Huger, MD        OBJECTIVE: Vitals:   12/06/20 0842  BP: 134/70  Pulse: 70  Resp: 18  Temp: (!) 97.2 F (36.2 C)  SpO2: 100%     Body mass index is 26.58 kg/m.    ECOG FS:0 - Asymptomatic   General: Well-developed, well-nourished, no acute distress. Eyes: Pink conjunctiva, anicteric sclera. HEENT: Normocephalic, moist mucous membranes. Lungs: No audible wheezing or coughing. Heart: Regular rate and rhythm. Abdomen: Soft, nontender, no obvious distention. Musculoskeletal: No edema, cyanosis, or clubbing. Neuro: Alert, answering all questions appropriately. Cranial nerves grossly intact. Skin: No rashes or petechiae noted. Psych: Normal affect.  LAB RESULTS:  Lab Results  Component Value Date   NA 139 12/06/2020   K 3.9 12/06/2020   CL 105 12/06/2020   CO2 23 12/06/2020   GLUCOSE 148 (H) 12/06/2020   BUN 13 12/06/2020   CREATININE 0.91 12/06/2020   CALCIUM 8.9 12/06/2020   PROT 7.5 12/06/2020   ALBUMIN 3.8 12/06/2020   AST 75 (H) 12/06/2020   ALT 67 (H) 12/06/2020   ALKPHOS 91 12/06/2020   BILITOT 0.8 12/06/2020   GFRNONAA >60 12/06/2020   GFRAA >60 05/23/2020    Lab Results  Component Value Date   WBC 4.5 12/06/2020   NEUTROABS 2.3 12/06/2020   HGB 10.3 (L) 12/06/2020   HCT 32.5 (L) 12/06/2020   MCV 89.5 12/06/2020   PLT 694 (H) 12/06/2020     STUDIES: MR LIVER W WO CONTRAST  Result Date: 12/01/2020 CLINICAL DATA:  Assess treatment response of cholangiocarcinoma. EXAM: MRI ABDOMEN WITHOUT AND WITH CONTRAST TECHNIQUE: Multiplanar multisequence MR imaging of the abdomen was performed both before and after the administration of intravenous contrast. CONTRAST:  19mL GADAVIST GADOBUTROL 1 MMOL/ML IV SOLN COMPARISON:  July 20, 2020 MRI evaluation. FINDINGS: Lower chest: Incidental imaging of the lung bases on MRI is unremarkable on limited assessment. Hepatobiliary: Diffuse  abnormal enhancement in the RIGHT hepatic lobe in hepatic subsegment V shows fatty sparing and hyperenhancement. Within the area of regional hyperenhancement is a more focal area of enhancement displaying some washout, this is along the gallbladder fossa best measured on image 54 of series 14 1.2 x 1.9 cm. This area was previously approximately 1.4 x 1.0 cm. Gallbladder fossa is partially obscured by susceptibility artifact from adjacent bowel gas. The lesion in this area may be smaller measuring approximately 1.3 cm as compared to 1.8 cm previously. Discrete hepatic lesion more central and cephalad seen only as a subtle signal abnormality on delayed imaging, showing increased and persistent enhancement rather than hypoenhancement centrally and is not visible distinctly as a hypointense area on the arterial phase as it was on the prior study. No new lesions are demonstrated. Pancreas: Normal intrinsic T1 signal. No ductal dilation or sign of inflammation. No focal lesion. Spleen:  Normal spleen. Adrenals/Urinary Tract:  Adrenal glands are normal. Symmetric renal enhancement.  No hydronephrosis. Stomach/Bowel: Gastrointestinal tract without acute  process on limited assessment. Vascular/Lymphatic: Patent portal vein. Normal caliber abdominal aorta. Persistent LEFT-sided IVC. Other:  No ascites. Musculoskeletal: No suspicious bone lesions identified. IMPRESSION: 1. Area of transient hepatic intensity difference surrounding lesions in the gallbladder fossa and hepatic subsegment V which appear to be improving though areas in the gallbladder fossa show limited assessment due to adjacent bowel gas and susceptibility related artifact. 2. The discrete more central lesion seen on the previous imaging study has changed in character, potentially related to healing of lesion resolving in the liver. Attention on follow-up. 3. Periportal lymph nodes are diminished in size measuring less than a cm on the current examination.  Attention on follow-up. 4. Persistent LEFT-sided IVC. Electronically Signed   By: Zetta Bills M.D.   On: 12/01/2020 14:08    ASSESSMENT: Stage IV intrahepatic cholangiocarcinoma.  PLAN:    1.  Stage IV intrahepatic cholangiocarcinoma: Incidental finding on routine cholecystectomy. PET scan results from July 13, 2020 as well as MRI results from July 20, 2020 reviewed independently with right lobe liver lesion consistent with intrahepatic cholangiocarcinoma.  Biopsy confirmed the results.  Gynecologic work-up is essentially negative.  Colonoscopy on June 15, 2020 was essentially within normal limits.  Mammogram on July 19, 2020 was reported as BI-RADS 2. All patient tumor markers are negative.  Patient has declined port placement at this time.  Plan to give cisplatin and gemcitabine on day 1 with gemcitabine only on day 8.  This will be a 21-day cycle.  Because of a history of neutropenia, patient will require Ziextenzo after day 8 of treatment.  Repeat MRI on December 01, 2020 reviewed independently and report as above with overall improvement of disease burden, but likely residual malignancy.  Proceed with 4 additional cycles and then reimage in June 2022.  Proceed with cycle 5, day 1 of treatment today.  Return to clinic in 1 week for further evaluation and consideration of cycle 5, day 8.   2.  Sinus congestion: Resolved.  Possibly related to COVID infection. 3.  Diarrhea: Resolved. 4.  Neutropenia: Recurrent.  Continue Ziextenzo after day 8 of each cycle. 5.  Anemia: Mildly improved.  Hemoglobin is trended up to 10.3. 6.  Thrombocytosis: Chronic and unchanged. 7.  Flank pain: Patient does not complain of this today.  Likely secondary to malignancy.  Abdominal ultrasound was unrevealing.   8.  Elevated liver enzymes: Chronic and change, but slightly improved. 9.  Acute renal failure: Resolved.  Patient expressed understanding and was in agreement with this plan. She also understands  that She can call clinic at any time with any questions, concerns, or complaints.    Cancer Staging Intrahepatic cholangiocarcinoma ([place]) Staging form: Intrahepatic Bile Duct, AJCC 8th Edition - Clinical stage from 08/04/2020: Stage IV (cT1b, cN0, pM1) - Signed by Lloyd Huger, MD on 08/04/2020   Lloyd Huger, MD   12/06/2020 2:07 PM

## 2020-12-06 ENCOUNTER — Inpatient Hospital Stay: Payer: BC Managed Care – PPO

## 2020-12-06 ENCOUNTER — Inpatient Hospital Stay (HOSPITAL_BASED_OUTPATIENT_CLINIC_OR_DEPARTMENT_OTHER): Payer: BC Managed Care – PPO | Admitting: Oncology

## 2020-12-06 VITALS — BP 134/70 | HR 70 | Temp 97.2°F | Resp 18 | Wt 159.7 lb

## 2020-12-06 DIAGNOSIS — C221 Intrahepatic bile duct carcinoma: Secondary | ICD-10-CM

## 2020-12-06 LAB — CBC WITH DIFFERENTIAL/PLATELET
Abs Immature Granulocytes: 0.02 10*3/uL (ref 0.00–0.07)
Basophils Absolute: 0.1 10*3/uL (ref 0.0–0.1)
Basophils Relative: 2 %
Eosinophils Absolute: 0.1 10*3/uL (ref 0.0–0.5)
Eosinophils Relative: 2 %
HCT: 32.5 % — ABNORMAL LOW (ref 36.0–46.0)
Hemoglobin: 10.3 g/dL — ABNORMAL LOW (ref 12.0–15.0)
Immature Granulocytes: 0 %
Lymphocytes Relative: 27 %
Lymphs Abs: 1.2 10*3/uL (ref 0.7–4.0)
MCH: 28.4 pg (ref 26.0–34.0)
MCHC: 31.7 g/dL (ref 30.0–36.0)
MCV: 89.5 fL (ref 80.0–100.0)
Monocytes Absolute: 0.8 10*3/uL (ref 0.1–1.0)
Monocytes Relative: 19 %
Neutro Abs: 2.3 10*3/uL (ref 1.7–7.7)
Neutrophils Relative %: 50 %
Platelets: 694 10*3/uL — ABNORMAL HIGH (ref 150–400)
RBC: 3.63 MIL/uL — ABNORMAL LOW (ref 3.87–5.11)
RDW: 25.7 % — ABNORMAL HIGH (ref 11.5–15.5)
WBC: 4.5 10*3/uL (ref 4.0–10.5)
nRBC: 0 % (ref 0.0–0.2)

## 2020-12-06 LAB — COMPREHENSIVE METABOLIC PANEL
ALT: 67 U/L — ABNORMAL HIGH (ref 0–44)
AST: 75 U/L — ABNORMAL HIGH (ref 15–41)
Albumin: 3.8 g/dL (ref 3.5–5.0)
Alkaline Phosphatase: 91 U/L (ref 38–126)
Anion gap: 11 (ref 5–15)
BUN: 13 mg/dL (ref 6–20)
CO2: 23 mmol/L (ref 22–32)
Calcium: 8.9 mg/dL (ref 8.9–10.3)
Chloride: 105 mmol/L (ref 98–111)
Creatinine, Ser: 0.91 mg/dL (ref 0.44–1.00)
GFR, Estimated: >60 mL/min (ref >60)
Glucose, Bld: 148 mg/dL — ABNORMAL HIGH (ref 70–99)
Potassium: 3.9 mmol/L (ref 3.5–5.1)
Sodium: 139 mmol/L (ref 135–145)
Total Bilirubin: 0.8 mg/dL (ref 0.3–1.2)
Total Protein: 7.5 g/dL (ref 6.5–8.1)

## 2020-12-06 MED ORDER — SODIUM CHLORIDE 0.9 % IV SOLN
Freq: Once | INTRAVENOUS | Status: AC
  Filled 2020-12-06: qty 250

## 2020-12-06 MED ORDER — SODIUM CHLORIDE 0.9 % IV SOLN
80.0000 mg/m2 | Freq: Once | INTRAVENOUS | Status: AC
  Administered 2020-12-06: 148 mg via INTRAVENOUS
  Filled 2020-12-06: qty 148

## 2020-12-06 MED ORDER — POTASSIUM CHLORIDE IN NACL 20-0.9 MEQ/L-% IV SOLN
Freq: Once | INTRAVENOUS | Status: AC
  Filled 2020-12-06: qty 1000

## 2020-12-06 MED ORDER — SODIUM CHLORIDE 0.9 % IV SOLN
150.0000 mg | Freq: Once | INTRAVENOUS | Status: AC
  Administered 2020-12-06: 150 mg via INTRAVENOUS
  Filled 2020-12-06: qty 150

## 2020-12-06 MED ORDER — SODIUM CHLORIDE 0.9 % IV SOLN
10.0000 mg | Freq: Once | INTRAVENOUS | Status: AC
  Administered 2020-12-06: 10 mg via INTRAVENOUS
  Filled 2020-12-06: qty 10

## 2020-12-06 MED ORDER — GEMCITABINE HCL CHEMO INJECTION 1 GM/26.3ML
1800.0000 mg | Freq: Once | INTRAVENOUS | Status: AC
Start: 2020-12-06 — End: 2020-12-06
  Administered 2020-12-06: 1800 mg via INTRAVENOUS
  Filled 2020-12-06: qty 26.3

## 2020-12-06 MED ORDER — PALONOSETRON HCL INJECTION 0.25 MG/5ML
0.2500 mg | Freq: Once | INTRAVENOUS | Status: AC
  Administered 2020-12-06: 0.25 mg via INTRAVENOUS
  Filled 2020-12-06: qty 5

## 2020-12-06 MED ORDER — MAGNESIUM SULFATE 2 GM/50ML IV SOLN
2.0000 g | Freq: Once | INTRAVENOUS | Status: AC
  Administered 2020-12-06: 2 g via INTRAVENOUS
  Filled 2020-12-06: qty 50

## 2020-12-06 NOTE — Progress Notes (Signed)
Gemzar started at 1455. Patient complaining of burning at IV site. Checked IV site, good blood return noted. Increased NS rate per protocol and decreased Gemzar rate. Patient stated it was "better" but not gone. Offered to restart IV, patient declined. Increased IV fluids to help buffer Gemzar. Patient able to tolerate remainder of infusion. Stable at discharge

## 2020-12-06 NOTE — Progress Notes (Signed)
Here for possible chemotherapy today. No complaints except intermittent sensitivity to skin in bilateral calves probably from varicose vein procedures.

## 2020-12-08 NOTE — H&P (View-Only) (Signed)
[place]  Telephone:(336) [phone] Fax:(336) [phone]  ID: Lauren Lindsey OB: [birth year]  MR#: [id number]  DTO#:[id number]  Patient Care Team: [name], MD as PCP - General (Family Medicine) Patient, No Pcp Per (General Practice) Lauren Jacks, RN as Oncology Nurse Navigator [name], [name], MD as Consulting Physician (Oncology)   CHIEF COMPLAINT: Stage IV intrahepatic cholangiocarcinoma.  INTERVAL HISTORY: Patient returns to clinic today for further evaluation and consideration of cycle 5, day 8 of cisplatin and gemcitabine.  Gemcitabine only today.  She had increased fatigue for several days after treatment, but this is now resolved and she feels back to her baseline. She does not complain of pain today. She has no neurologic complaints.  She has no chest pain, shortness of breath, cough, or hemoptysis.  She denies any nausea, vomiting, constipation, or diarrhea.  She has no melena or hematochezia.  She has no urinary complaints.  Patient offers no specific complaints today.  REVIEW OF SYSTEMS:   Review of Systems  Constitutional: Negative.  Negative for fever, malaise/fatigue and weight loss.  HENT: Negative.  Negative for congestion and sinus pain.   Respiratory: Negative.  Negative for cough, hemoptysis and shortness of breath.   Cardiovascular: Negative.  Negative for chest pain and leg swelling.  Gastrointestinal: Negative.  Negative for abdominal pain, blood in stool, constipation, diarrhea, melena, nausea and vomiting.  Genitourinary: Negative.  Negative for dysuria and flank pain.  Musculoskeletal: Negative.  Negative for back pain.  Skin: Negative.  Negative for rash.  Neurological: Negative.  Negative for dizziness, focal weakness, weakness and headaches.  Psychiatric/Behavioral: Negative.  The patient is not nervous/anxious.     As per HPI. Otherwise, a complete review of systems is negative.  PAST MEDICAL HISTORY: Past Medical  History:  Diagnosis Date  . Abnormal Pap smear of cervix   . Anemia    h/o  . Diabetes mellitus without complication ([facility])     PAST SURGICAL HISTORY: Past Surgical History:  Procedure Laterality Date  . COLONOSCOPY WITH PROPOFOL N/A 06/15/2020   Procedure: COLONOSCOPY WITH PROPOFOL;  Surgeon: Lin Landsman, MD;  Location: [facility];  Service: Endoscopy;  Laterality: N/A;  Diabetic - oral meds  . ENDOMETRIAL ABLATION    . HYSTEROSCOPY WITH D & C    . LEEP      FAMILY HISTORY: Family History  Problem Relation Age of Onset  . Bleeding Disorder Mother   . Biliary Cirrhosis Mother   . Liver cancer Mother   . Hypertension Mother   . Diabetes Father   . Lung cancer Father   . Breast cancer Neg Hx     ADVANCED DIRECTIVES (Y/N):  N  HEALTH MAINTENANCE: Social History   Tobacco Use  . Smoking status: Never Smoker  . Smokeless tobacco: Never Used  Vaping Use  . Vaping Use: Never used  Substance Use Topics  . Alcohol use: No  . Drug use: No     Colonoscopy:  PAP:  Bone density:  Lipid panel:  No Known Allergies  Current Outpatient Medications  Medication Sig Dispense Refill  . acetaminophen (TYLENOL) 500 MG tablet Take 500 mg by mouth every 6 (six) hours as needed.    . metFORMIN (GLUCOPHAGE) 500 MG tablet Take 500 mg by mouth 2 (two) times daily.    Marland Kitchen HYDROcodone-acetaminophen (NORCO/VICODIN) 5-325 MG tablet Take 1 tablet by mouth every 4 (four) hours as needed for moderate pain. (Patient not taking: No sig reported) 60 tablet  0  . ondansetron (ZOFRAN) 8 MG tablet Take 1 tablet (8 mg total) by mouth every 8 (eight) hours as needed for nausea or vomiting. (Patient not taking: No sig reported) 30 tablet 2   No current facility-administered medications for this visit.   Facility-Administered Medications Ordered in Other Visits  Medication Dose Route Frequency Provider Last Rate Last Admin  . 0.9 %  sodium chloride infusion   Intravenous Once Lloyd Huger, MD      . gemcitabine (GEMZAR) 1,800 mg in sodium chloride 0.9 % 250 mL chemo infusion  1,800 mg Intravenous Once Lloyd Huger, MD      . heparin lock flush 100 unit/mL  500 Units Intracatheter Once PRN Lloyd Huger, MD      . prochlorperazine (COMPAZINE) tablet 10 mg  10 mg Oral Once Lloyd Huger, MD        OBJECTIVE: Vitals:   12/13/20 0839  BP: (!) 144/72  Pulse: 68  Resp: 20  Temp: (!) 97.4 F (36.3 C)     Body mass index is 26.28 kg/m.    ECOG FS:0 - Asymptomatic   General: Well-developed, well-nourished, no acute distress. Eyes: Pink conjunctiva, anicteric sclera. HEENT: Normocephalic, moist mucous membranes. Lungs: No audible wheezing or coughing. Heart: Regular rate and rhythm. Abdomen: Soft, nontender, no obvious distention. Musculoskeletal: No edema, cyanosis, or clubbing. Neuro: Alert, answering all questions appropriately. Cranial nerves grossly intact. Skin: No rashes or petechiae noted. Psych: Normal affect.  LAB RESULTS:  Lab Results  Component Value Date   NA 133 (L) 12/13/2020   K 3.7 12/13/2020   CL 100 12/13/2020   CO2 24 12/13/2020   GLUCOSE 241 (H) 12/13/2020   BUN 14 12/13/2020   CREATININE 0.79 12/13/2020   CALCIUM 8.5 (L) 12/13/2020   PROT 7.2 12/13/2020   ALBUMIN 3.7 12/13/2020   AST 96 (H) 12/13/2020   ALT 101 (H) 12/13/2020   ALKPHOS 93 12/13/2020   BILITOT 0.7 12/13/2020   GFRNONAA >60 12/13/2020   GFRAA >60 05/23/2020    Lab Results  Component Value Date   WBC 1.9 (L) 12/13/2020   NEUTROABS 0.9 (L) 12/13/2020   HGB 9.8 (L) 12/13/2020   HCT 30.1 (L) 12/13/2020   MCV 90.4 12/13/2020   PLT 324 12/13/2020     STUDIES: MR LIVER W WO CONTRAST  Result Date: 12/01/2020 CLINICAL DATA:  Assess treatment response of cholangiocarcinoma. EXAM: MRI ABDOMEN WITHOUT AND WITH CONTRAST TECHNIQUE: Multiplanar multisequence MR imaging of the abdomen was performed both before and after the administration of  intravenous contrast. CONTRAST:  53mL GADAVIST GADOBUTROL 1 MMOL/ML IV SOLN COMPARISON:  July 20, 2020 MRI evaluation. FINDINGS: Lower chest: Incidental imaging of the lung bases on MRI is unremarkable on limited assessment. Hepatobiliary: Diffuse abnormal enhancement in the RIGHT hepatic lobe in hepatic subsegment V shows fatty sparing and hyperenhancement. Within the area of regional hyperenhancement is a more focal area of enhancement displaying some washout, this is along the gallbladder fossa best measured on image 54 of series 14 1.2 x 1.9 cm. This area was previously approximately 1.4 x 1.0 cm. Gallbladder fossa is partially obscured by susceptibility artifact from adjacent bowel gas. The lesion in this area may be smaller measuring approximately 1.3 cm as compared to 1.8 cm previously. Discrete hepatic lesion more central and cephalad seen only as a subtle signal abnormality on delayed imaging, showing increased and persistent enhancement rather than hypoenhancement centrally and is not visible distinctly as a hypointense area  on the arterial phase as it was on the prior study. No new lesions are demonstrated. Pancreas: Normal intrinsic T1 signal. No ductal dilation or sign of inflammation. No focal lesion. Spleen:  Normal spleen. Adrenals/Urinary Tract:  Adrenal glands are normal. Symmetric renal enhancement.  No hydronephrosis. Stomach/Bowel: Gastrointestinal tract without acute process on limited assessment. Vascular/Lymphatic: Patent portal vein. Normal caliber abdominal aorta. Persistent LEFT-sided IVC. Other:  No ascites. Musculoskeletal: No suspicious bone lesions identified. IMPRESSION: 1. Area of transient hepatic intensity difference surrounding lesions in the gallbladder fossa and hepatic subsegment V which appear to be improving though areas in the gallbladder fossa show limited assessment due to adjacent bowel gas and susceptibility related artifact. 2. The discrete more central lesion seen on  the previous imaging study has changed in character, potentially related to healing of lesion resolving in the liver. Attention on follow-up. 3. Periportal lymph nodes are diminished in size measuring less than a cm on the current examination. Attention on follow-up. 4. Persistent LEFT-sided IVC. Electronically Signed   By: Zetta Bills M.D.   On: 12/01/2020 14:08    ASSESSMENT: Stage IV intrahepatic cholangiocarcinoma.  PLAN:    1.  Stage IV intrahepatic cholangiocarcinoma: Incidental finding on routine cholecystectomy. PET scan results from July 13, 2020 as well as MRI results from July 20, 2020 reviewed independently with right lobe liver lesion consistent with intrahepatic cholangiocarcinoma.  Biopsy confirmed the results.  Gynecologic work-up is essentially negative.  Colonoscopy on June 15, 2020 was essentially within normal limits.  Mammogram on July 19, 2020 was reported as BI-RADS 2. All patient tumor markers are negative.  Patient has declined port placement at this time.  Plan to give cisplatin and gemcitabine on day 1 with gemcitabine only on day 8.  This will be a 21-day cycle.  Because of a history of neutropenia, patient will require Ziextenzo after day 8 of treatment.  Repeat MRI on December 01, 2020 reviewed independently and report as above with overall improvement of disease burden, but likely residual malignancy.  Proceed with 4 additional cycles and then reimage in June 2022.  Despite mild neutropenia, will proceed with cycle 5, day 8 of treatment today.  Return to clinic in 2 days for Ziextenzo then in 2 weeks for further evaluation and consideration of cycle 6, day 1.    2.  Sinus congestion: Resolved.  Possibly related to COVID infection. 3.  Diarrhea: Resolved. 4.  Neutropenia: Recurrent.  Continue Ziextenzo in 2 days and then after day 8 of each cycle. 5.  Anemia: Chronic and unchanged.  Patient's hemoglobin is 9.8 today. 6.  Thrombocytosis: Solved. 7.  Flank pain:  Patient does not complain of this today.  Likely secondary to malignancy.  Abdominal ultrasound was unrevealing.   8.  Elevated liver enzymes: Slightly worse today.  Proceed with treatment as planned. 9.  Acute renal failure: Resolved. 10.  Hyperglycemia: Monitor.  Patient expressed understanding and was in agreement with this plan. She also understands that She can call clinic at any time with any questions, concerns, or complaints.    Cancer Staging Intrahepatic cholangiocarcinoma ([place]) Staging form: Intrahepatic Bile Duct, AJCC 8th Edition - Clinical stage from 08/04/2020: Stage IV (cT1b, cN0, pM1) - Signed by Lloyd Huger, MD on 08/04/2020   Lloyd Huger, MD   12/13/2020 9:42 AM

## 2020-12-08 NOTE — Progress Notes (Signed)
[place]  [phone] [phone]  ID: Lauren Lindsey OB: [birth year]  MR#: [id number]  WLN#:[id number]  Patient Care Team: [name], MD as PCP - General (Family Medicine) Patient, No Pcp Per (General Practice) [name], RN as Oncology Nurse Navigator [name], [name], MD as Consulting Physician (Oncology)   CHIEF COMPLAINT: Stage IV intrahepatic cholangiocarcinoma.  INTERVAL HISTORY: Patient returns to clinic today for further evaluation and consideration of cycle 5, day 8 of cisplatin and gemcitabine.  Gemcitabine only today.  She had increased fatigue for several days after treatment, but this is now resolved and she feels back to her baseline. She does not complain of pain today. She has no neurologic complaints.  She has no chest pain, shortness of breath, cough, or hemoptysis.  She denies any nausea, vomiting, constipation, or diarrhea.  She has no melena or hematochezia.  She has no urinary complaints.  Patient offers no specific complaints today.  REVIEW OF SYSTEMS:   Review of Systems  Constitutional: Negative.  Negative for fever, malaise/fatigue and weight loss.  HENT: Negative.  Negative for congestion and sinus pain.   Respiratory: Negative.  Negative for cough, hemoptysis and shortness of breath.   Cardiovascular: Negative.  Negative for chest pain and leg swelling.  Gastrointestinal: Negative.  Negative for abdominal pain, blood in stool, constipation, diarrhea, melena, nausea and vomiting.  Genitourinary: Negative.  Negative for dysuria and flank pain.  Musculoskeletal: Negative.  Negative for back pain.  Skin: Negative.  Negative for rash.  Neurological: Negative.  Negative for dizziness, focal weakness, weakness and headaches.  Psychiatric/Behavioral: Negative.  The patient is not nervous/anxious.     As per HPI. Otherwise, a complete review of systems is negative.  PAST MEDICAL HISTORY: Past Medical  History:  Diagnosis Date  . Abnormal Pap smear of cervix   . Anemia    h/o  . Diabetes mellitus without complication ([place])     PAST SURGICAL HISTORY: Past Surgical History:  Procedure Laterality Date  . COLONOSCOPY WITH PROPOFOL N/A 06/15/2020   Procedure: COLONOSCOPY WITH PROPOFOL;  Surgeon: Lin Landsman, MD;  Location: ;  Service: Endoscopy;  Laterality: N/A;  Diabetic - oral meds  . ENDOMETRIAL ABLATION    . HYSTEROSCOPY WITH D & C    . LEEP      FAMILY HISTORY: Family History  Problem Relation Age of Onset  . Bleeding Disorder Mother   . Biliary Cirrhosis Mother   . Liver cancer Mother   . Hypertension Mother   . Diabetes Father   . Lung cancer Father   . Breast cancer Neg Hx     ADVANCED DIRECTIVES (Y/N):  N  HEALTH MAINTENANCE: Social History   Tobacco Use  . Smoking status: Never Smoker  . Smokeless tobacco: Never Used  Vaping Use  . Vaping Use: Never used  Substance Use Topics  . Alcohol use: No  . Drug use: No     Colonoscopy:  PAP:  Bone density:  Lipid panel:  No Known Allergies  Current Outpatient Medications  Medication Sig Dispense Refill  . acetaminophen (TYLENOL) 500 MG tablet Take 500 mg by mouth every 6 (six) hours as needed.    . metFORMIN (GLUCOPHAGE) 500 MG tablet Take 500 mg by mouth 2 (two) times daily.    Marland Kitchen HYDROcodone-acetaminophen (NORCO/VICODIN) 5-325 MG tablet Take 1 tablet by mouth every 4 (four) hours as needed for moderate pain. (Patient not taking: No sig reported) 60 tablet  0  . ondansetron (ZOFRAN) 8 MG tablet Take 1 tablet (8 mg total) by mouth every 8 (eight) hours as needed for nausea or vomiting. (Patient not taking: No sig reported) 30 tablet 2   No current facility-administered medications for this visit.   Facility-Administered Medications Ordered in Other Visits  Medication Dose Route Frequency Provider Last Rate Last Admin  . 0.9 %  sodium chloride infusion   Intravenous Once Lloyd Huger, MD      . gemcitabine (GEMZAR) 1,800 mg in sodium chloride 0.9 % 250 mL chemo infusion  1,800 mg Intravenous Once Lloyd Huger, MD      . heparin lock flush 100 unit/mL  500 Units Intracatheter Once PRN Lloyd Huger, MD      . prochlorperazine (COMPAZINE) tablet 10 mg  10 mg Oral Once Lloyd Huger, MD        OBJECTIVE: Vitals:   12/13/20 0839  BP: (!) 144/72  Pulse: 68  Resp: 20  Temp: (!) 97.4 F (36.3 C)     Body mass index is 26.28 kg/m.    ECOG FS:0 - Asymptomatic   General: Well-developed, well-nourished, no acute distress. Eyes: Pink conjunctiva, anicteric sclera. HEENT: Normocephalic, moist mucous membranes. Lungs: No audible wheezing or coughing. Heart: Regular rate and rhythm. Abdomen: Soft, nontender, no obvious distention. Musculoskeletal: No edema, cyanosis, or clubbing. Neuro: Alert, answering all questions appropriately. Cranial nerves grossly intact. Skin: No rashes or petechiae noted. Psych: Normal affect.  LAB RESULTS:  Lab Results  Component Value Date   NA 133 (L) 12/13/2020   K 3.7 12/13/2020   CL 100 12/13/2020   CO2 24 12/13/2020   GLUCOSE 241 (H) 12/13/2020   BUN 14 12/13/2020   CREATININE 0.79 12/13/2020   CALCIUM 8.5 (L) 12/13/2020   PROT 7.2 12/13/2020   ALBUMIN 3.7 12/13/2020   AST 96 (H) 12/13/2020   ALT 101 (H) 12/13/2020   ALKPHOS 93 12/13/2020   BILITOT 0.7 12/13/2020   GFRNONAA >60 12/13/2020   GFRAA >60 05/23/2020    Lab Results  Component Value Date   WBC 1.9 (L) 12/13/2020   NEUTROABS 0.9 (L) 12/13/2020   HGB 9.8 (L) 12/13/2020   HCT 30.1 (L) 12/13/2020   MCV 90.4 12/13/2020   PLT 324 12/13/2020     STUDIES: MR LIVER W WO CONTRAST  Result Date: 12/01/2020 CLINICAL DATA:  Assess treatment response of cholangiocarcinoma. EXAM: MRI ABDOMEN WITHOUT AND WITH CONTRAST TECHNIQUE: Multiplanar multisequence MR imaging of the abdomen was performed both before and after the administration of  intravenous contrast. CONTRAST:  69mL GADAVIST GADOBUTROL 1 MMOL/ML IV SOLN COMPARISON:  July 20, 2020 MRI evaluation. FINDINGS: Lower chest: Incidental imaging of the lung bases on MRI is unremarkable on limited assessment. Hepatobiliary: Diffuse abnormal enhancement in the RIGHT hepatic lobe in hepatic subsegment V shows fatty sparing and hyperenhancement. Within the area of regional hyperenhancement is a more focal area of enhancement displaying some washout, this is along the gallbladder fossa best measured on image 54 of series 14 1.2 x 1.9 cm. This area was previously approximately 1.4 x 1.0 cm. Gallbladder fossa is partially obscured by susceptibility artifact from adjacent bowel gas. The lesion in this area may be smaller measuring approximately 1.3 cm as compared to 1.8 cm previously. Discrete hepatic lesion more central and cephalad seen only as a subtle signal abnormality on delayed imaging, showing increased and persistent enhancement rather than hypoenhancement centrally and is not visible distinctly as a hypointense area  on the arterial phase as it was on the prior study. No new lesions are demonstrated. Pancreas: Normal intrinsic T1 signal. No ductal dilation or sign of inflammation. No focal lesion. Spleen:  Normal spleen. Adrenals/Urinary Tract:  Adrenal glands are normal. Symmetric renal enhancement.  No hydronephrosis. Stomach/Bowel: Gastrointestinal tract without acute process on limited assessment. Vascular/Lymphatic: Patent portal vein. Normal caliber abdominal aorta. Persistent LEFT-sided IVC. Other:  No ascites. Musculoskeletal: No suspicious bone lesions identified. IMPRESSION: 1. Area of transient hepatic intensity difference surrounding lesions in the gallbladder fossa and hepatic subsegment V which appear to be improving though areas in the gallbladder fossa show limited assessment due to adjacent bowel gas and susceptibility related artifact. 2. The discrete more central lesion seen on  the previous imaging study has changed in character, potentially related to healing of lesion resolving in the liver. Attention on follow-up. 3. Periportal lymph nodes are diminished in size measuring less than a cm on the current examination. Attention on follow-up. 4. Persistent LEFT-sided IVC. Electronically Signed   By: Zetta Bills M.D.   On: 12/01/2020 14:08    ASSESSMENT: Stage IV intrahepatic cholangiocarcinoma.  PLAN:    1.  Stage IV intrahepatic cholangiocarcinoma: Incidental finding on routine cholecystectomy. PET scan results from July 13, 2020 as well as MRI results from July 20, 2020 reviewed independently with right lobe liver lesion consistent with intrahepatic cholangiocarcinoma.  Biopsy confirmed the results.  Gynecologic work-up is essentially negative.  Colonoscopy on June 15, 2020 was essentially within normal limits.  Mammogram on July 19, 2020 was reported as BI-RADS 2. All patient tumor markers are negative.  Patient has declined port placement at this time.  Plan to give cisplatin and gemcitabine on day 1 with gemcitabine only on day 8.  This will be a 21-day cycle.  Because of a history of neutropenia, patient will require Ziextenzo after day 8 of treatment.  Repeat MRI on December 01, 2020 reviewed independently and report as above with overall improvement of disease burden, but likely residual malignancy.  Proceed with 4 additional cycles and then reimage in June 2022.  Despite mild neutropenia, will proceed with cycle 5, day 8 of treatment today.  Return to clinic in 2 days for Ziextenzo then in 2 weeks for further evaluation and consideration of cycle 6, day 1.    2.  Sinus congestion: Resolved.  Possibly related to COVID infection. 3.  Diarrhea: Resolved. 4.  Neutropenia: Recurrent.  Continue Ziextenzo in 2 days and then after day 8 of each cycle. 5.  Anemia: Chronic and unchanged.  Patient's hemoglobin is 9.8 today. 6.  Thrombocytosis: Solved. 7.  Flank pain:  Patient does not complain of this today.  Likely secondary to malignancy.  Abdominal ultrasound was unrevealing.   8.  Elevated liver enzymes: Slightly worse today.  Proceed with treatment as planned. 9.  Acute renal failure: Resolved. 10.  Hyperglycemia: Monitor.  Patient expressed understanding and was in agreement with this plan. She also understands that She can call clinic at any time with any questions, concerns, or complaints.    Cancer Staging Intrahepatic cholangiocarcinoma ([place]) Staging form: Intrahepatic Bile Duct, AJCC 8th Edition - Clinical stage from 08/04/2020: Stage IV (cT1b, cN0, pM1) - Signed by Lloyd Huger, MD on 08/04/2020   Lloyd Huger, MD   12/13/2020 9:42 AM

## 2020-12-13 ENCOUNTER — Inpatient Hospital Stay: Payer: BC Managed Care – PPO

## 2020-12-13 ENCOUNTER — Inpatient Hospital Stay (HOSPITAL_BASED_OUTPATIENT_CLINIC_OR_DEPARTMENT_OTHER): Payer: BC Managed Care – PPO | Admitting: Oncology

## 2020-12-13 ENCOUNTER — Encounter: Payer: Self-pay | Admitting: Oncology

## 2020-12-13 VITALS — BP 144/72 | HR 68 | Temp 97.4°F | Resp 20 | Wt 157.9 lb

## 2020-12-13 DIAGNOSIS — C221 Intrahepatic bile duct carcinoma: Secondary | ICD-10-CM

## 2020-12-13 LAB — COMPREHENSIVE METABOLIC PANEL
ALT: 101 U/L — ABNORMAL HIGH (ref 0–44)
AST: 96 U/L — ABNORMAL HIGH (ref 15–41)
Albumin: 3.7 g/dL (ref 3.5–5.0)
Alkaline Phosphatase: 93 U/L (ref 38–126)
Anion gap: 9 (ref 5–15)
BUN: 14 mg/dL (ref 6–20)
CO2: 24 mmol/L (ref 22–32)
Calcium: 8.5 mg/dL — ABNORMAL LOW (ref 8.9–10.3)
Chloride: 100 mmol/L (ref 98–111)
Creatinine, Ser: 0.79 mg/dL (ref 0.44–1.00)
GFR, Estimated: >60 mL/min (ref >60)
Glucose, Bld: 241 mg/dL — ABNORMAL HIGH (ref 70–99)
Potassium: 3.7 mmol/L (ref 3.5–5.1)
Sodium: 133 mmol/L — ABNORMAL LOW (ref 135–145)
Total Bilirubin: 0.7 mg/dL (ref 0.3–1.2)
Total Protein: 7.2 g/dL (ref 6.5–8.1)

## 2020-12-13 LAB — CBC WITH DIFFERENTIAL/PLATELET
Abs Immature Granulocytes: 0.01 K/uL (ref 0.00–0.07)
Basophils Absolute: 0 K/uL (ref 0.0–0.1)
Basophils Relative: 2 %
Eosinophils Absolute: 0 K/uL (ref 0.0–0.5)
Eosinophils Relative: 1 %
HCT: 30.1 % — ABNORMAL LOW (ref 36.0–46.0)
Hemoglobin: 9.8 g/dL — ABNORMAL LOW (ref 12.0–15.0)
Immature Granulocytes: 1 %
Lymphocytes Relative: 45 %
Lymphs Abs: 0.9 K/uL (ref 0.7–4.0)
MCH: 29.4 pg (ref 26.0–34.0)
MCHC: 32.6 g/dL (ref 30.0–36.0)
MCV: 90.4 fL (ref 80.0–100.0)
Monocytes Absolute: 0.1 K/uL (ref 0.1–1.0)
Monocytes Relative: 5 %
Neutro Abs: 0.9 K/uL — ABNORMAL LOW (ref 1.7–7.7)
Neutrophils Relative %: 46 %
Platelets: 324 K/uL (ref 150–400)
RBC: 3.33 MIL/uL — ABNORMAL LOW (ref 3.87–5.11)
RDW: 20.3 % — ABNORMAL HIGH (ref 11.5–15.5)
WBC: 1.9 K/uL — ABNORMAL LOW (ref 4.0–10.5)
nRBC: 0 % (ref 0.0–0.2)

## 2020-12-13 MED ORDER — HEPARIN SOD (PORK) LOCK FLUSH 100 UNIT/ML IV SOLN
500.0000 [IU] | Freq: Once | INTRAVENOUS | Status: DC | PRN
  Filled 2020-12-13: qty 5

## 2020-12-13 MED ORDER — SODIUM CHLORIDE 0.9 % IV SOLN
Freq: Once | INTRAVENOUS | Status: AC
  Filled 2020-12-13: qty 250

## 2020-12-13 MED ORDER — PROCHLORPERAZINE MALEATE 10 MG PO TABS
10.0000 mg | ORAL_TABLET | Freq: Once | ORAL | Status: AC
  Administered 2020-12-13: 10 mg via ORAL
  Filled 2020-12-13: qty 1

## 2020-12-13 MED ORDER — SODIUM CHLORIDE 0.9 % IV SOLN
1800.0000 mg | Freq: Once | INTRAVENOUS | Status: AC
  Administered 2020-12-13: 1800 mg via INTRAVENOUS
  Filled 2020-12-13: qty 26.3

## 2020-12-13 NOTE — Progress Notes (Signed)
Per Dr. [name] patient can receive Gemzar today with a ALT of 101 and AST of 96.

## 2020-12-13 NOTE — Progress Notes (Signed)
Patient denies any concerns today.  

## 2020-12-15 ENCOUNTER — Inpatient Hospital Stay: Payer: BC Managed Care – PPO

## 2020-12-15 DIAGNOSIS — C221 Intrahepatic bile duct carcinoma: Secondary | ICD-10-CM | POA: Diagnosis not present

## 2020-12-15 MED ORDER — PEGFILGRASTIM-BMEZ 6 MG/0.6ML ~~LOC~~ SOSY
6.0000 mg | PREFILLED_SYRINGE | Freq: Once | SUBCUTANEOUS | Status: AC
Start: 2020-12-15 — End: 2020-12-15
  Administered 2020-12-15: 6 mg via SUBCUTANEOUS
  Filled 2020-12-15: qty 0.6

## 2020-12-21 NOTE — Progress Notes (Signed)
[place]  Telephone:(336) [phone] Fax:(336) [phone]  ID: [name] OB: 1961/01/16  MR#: [id number]  MEQ#:[id number]  Patient Care Team: [name], MD as PCP - General (Family Medicine) Patient, No Pcp Per (Inactive) (General Practice) [name], RN as Oncology Nurse Navigator [name], [name], MD as Consulting Physician (Oncology)   CHIEF COMPLAINT: Stage IV intrahepatic cholangiocarcinoma.  INTERVAL HISTORY: Patient returns to clinic today for further evaluation and consideration of cycle 6, day 1 of cisplatin and gemcitabine.  She has noticed lower abdominal discomfort and feels like she may have a UTI, but no urinary symptoms.  She continues to have mild weakness and fatigue.  She does not complain of pain today. She has no neurologic complaints.  She has no chest pain, shortness of breath, cough, or hemoptysis.  She denies any nausea, vomiting, constipation, or diarrhea.  She has no melena or hematochezia.  She has no urinary complaints.  Patient offers no further specific complaints today.  REVIEW OF SYSTEMS:   Review of Systems  Constitutional: Negative.  Negative for fever, malaise/fatigue and weight loss.  HENT: Negative.  Negative for congestion and sinus pain.   Respiratory: Negative.  Negative for cough, hemoptysis and shortness of breath.   Cardiovascular: Negative.  Negative for chest pain and leg swelling.  Gastrointestinal: Negative.  Negative for abdominal pain, blood in stool, constipation, diarrhea, melena, nausea and vomiting.  Genitourinary: Negative.  Negative for dysuria and flank pain.  Musculoskeletal: Negative.  Negative for back pain.  Skin: Negative.  Negative for rash.  Neurological: Negative.  Negative for dizziness, focal weakness, weakness and headaches.  Psychiatric/Behavioral: Negative.  The patient is not nervous/anxious.     As per HPI. Otherwise, a complete review of systems is negative.  PAST MEDICAL  HISTORY: Past Medical History:  Diagnosis Date  . Abnormal Pap smear of cervix   . Anemia    h/o  . Diabetes mellitus without complication ([place])     PAST SURGICAL HISTORY: Past Surgical History:  Procedure Laterality Date  . COLONOSCOPY WITH PROPOFOL N/A 06/15/2020   Procedure: COLONOSCOPY WITH PROPOFOL;  Surgeon: Lin Landsman, MD;  Location: [place];  Service: Endoscopy;  Laterality: N/A;  Diabetic - oral meds  . ENDOMETRIAL ABLATION    . HYSTEROSCOPY WITH D & C    . LEEP      FAMILY HISTORY: Family History  Problem Relation Age of Onset  . Bleeding Disorder Mother   . Biliary Cirrhosis Mother   . Liver cancer Mother   . Hypertension Mother   . Diabetes Father   . Lung cancer Father   . Breast cancer Neg Hx     ADVANCED DIRECTIVES (Y/N):  N  HEALTH MAINTENANCE: Social History   Tobacco Use  . Smoking status: Never Smoker  . Smokeless tobacco: Never Used  Vaping Use  . Vaping Use: Never used  Substance Use Topics  . Alcohol use: No  . Drug use: No     Colonoscopy:  PAP:  Bone density:  Lipid panel:  No Known Allergies  Current Outpatient Medications  Medication Sig Dispense Refill  . acetaminophen (TYLENOL) 500 MG tablet Take 500 mg by mouth every 6 (six) hours as needed.    Marland Kitchen HYDROcodone-acetaminophen (NORCO/VICODIN) 5-325 MG tablet Take 1 tablet by mouth every 4 (four) hours as needed for moderate pain. 60 tablet 0  . metFORMIN (GLUCOPHAGE) 500 MG tablet Take 500 mg by mouth 2 (two) times daily.    Marland Kitchen  ondansetron (ZOFRAN) 8 MG tablet Take 1 tablet (8 mg total) by mouth every 8 (eight) hours as needed for nausea or vomiting. 30 tablet 2   No current facility-administered medications for this visit.    OBJECTIVE: Vitals:   12/27/20 0906  BP: 122/75  Pulse: 75  Temp: 98.4 F (36.9 C)     Body mass index is 26.86 kg/m.    ECOG FS:0 - Asymptomatic   General: Well-developed, well-nourished, no acute distress. Eyes: Pink  conjunctiva, anicteric sclera. HEENT: Normocephalic, moist mucous membranes. Lungs: No audible wheezing or coughing. Heart: Regular rate and rhythm. Abdomen: Soft, nontender, no obvious distention. Musculoskeletal: No edema, cyanosis, or clubbing. Neuro: Alert, answering all questions appropriately. Cranial nerves grossly intact. Skin: No rashes or petechiae noted. Psych: Normal affect.   LAB RESULTS:  Lab Results  Component Value Date   NA 138 12/27/2020   K 4.2 12/27/2020   CL 104 12/27/2020   CO2 26 12/27/2020   GLUCOSE 160 (H) 12/27/2020   BUN 15 12/27/2020   CREATININE 1.08 (H) 12/27/2020   CALCIUM 8.7 (L) 12/27/2020   PROT 7.3 12/27/2020   ALBUMIN 3.4 (L) 12/27/2020   AST 119 (H) 12/27/2020   ALT 54 (H) 12/27/2020   ALKPHOS 128 (H) 12/27/2020   BILITOT 0.6 12/27/2020   GFRNONAA 60 (L) 12/27/2020   GFRAA >60 05/23/2020    Lab Results  Component Value Date   WBC 10.4 12/27/2020   NEUTROABS 7.2 12/27/2020   HGB 9.5 (L) 12/27/2020   HCT 30.4 (L) 12/27/2020   MCV 94.1 12/27/2020   PLT 360 12/27/2020     STUDIES: MR LIVER W WO CONTRAST  Result Date: 12/01/2020 CLINICAL DATA:  Assess treatment response of cholangiocarcinoma. EXAM: MRI ABDOMEN WITHOUT AND WITH CONTRAST TECHNIQUE: Multiplanar multisequence MR imaging of the abdomen was performed both before and after the administration of intravenous contrast. CONTRAST:  25mL GADAVIST GADOBUTROL 1 MMOL/ML IV SOLN COMPARISON:  July 20, 2020 MRI evaluation. FINDINGS: Lower chest: Incidental imaging of the lung bases on MRI is unremarkable on limited assessment. Hepatobiliary: Diffuse abnormal enhancement in the RIGHT hepatic lobe in hepatic subsegment V shows fatty sparing and hyperenhancement. Within the area of regional hyperenhancement is a more focal area of enhancement displaying some washout, this is along the gallbladder fossa best measured on image 54 of series 14 1.2 x 1.9 cm. This area was previously  approximately 1.4 x 1.0 cm. Gallbladder fossa is partially obscured by susceptibility artifact from adjacent bowel gas. The lesion in this area may be smaller measuring approximately 1.3 cm as compared to 1.8 cm previously. Discrete hepatic lesion more central and cephalad seen only as a subtle signal abnormality on delayed imaging, showing increased and persistent enhancement rather than hypoenhancement centrally and is not visible distinctly as a hypointense area on the arterial phase as it was on the prior study. No new lesions are demonstrated. Pancreas: Normal intrinsic T1 signal. No ductal dilation or sign of inflammation. No focal lesion. Spleen:  Normal spleen. Adrenals/Urinary Tract:  Adrenal glands are normal. Symmetric renal enhancement.  No hydronephrosis. Stomach/Bowel: Gastrointestinal tract without acute process on limited assessment. Vascular/Lymphatic: Patent portal vein. Normal caliber abdominal aorta. Persistent LEFT-sided IVC. Other:  No ascites. Musculoskeletal: No suspicious bone lesions identified. IMPRESSION: 1. Area of transient hepatic intensity difference surrounding lesions in the gallbladder fossa and hepatic subsegment V which appear to be improving though areas in the gallbladder fossa show limited assessment due to adjacent bowel gas and susceptibility related artifact.  2. The discrete more central lesion seen on the previous imaging study has changed in character, potentially related to healing of lesion resolving in the liver. Attention on follow-up. 3. Periportal lymph nodes are diminished in size measuring less than a cm on the current examination. Attention on follow-up. 4. Persistent LEFT-sided IVC. Electronically Signed   By: Zetta Bills M.D.   On: 12/01/2020 14:08    ASSESSMENT: Stage IV intrahepatic cholangiocarcinoma.  PLAN:    1.  Stage IV intrahepatic cholangiocarcinoma: Incidental finding on routine cholecystectomy. PET scan results from July 13, 2020 as well  as MRI results from July 20, 2020 reviewed independently with right lobe liver lesion consistent with intrahepatic cholangiocarcinoma.  Biopsy confirmed the results.  Gynecologic work-up is essentially negative.  Colonoscopy on June 15, 2020 was essentially within normal limits.  Mammogram on July 19, 2020 was reported as BI-RADS 2. All patient tumor markers are negative.  Patient has declined port placement at this time.  Plan to give cisplatin and gemcitabine on day 1 with gemcitabine only on day 8.  This will be a 21-day cycle.  Because of a history of neutropenia, patient will require Ziextenzo after day 8 of treatment.  Repeat MRI on December 01, 2020 reviewed independently and report as above with overall improvement of disease burden, but likely residual malignancy.  Proceed with 4 additional cycles and then reimage in June 2022.  Delay cycle 6, day 1 of treatment secondary to IV access and patient will have a port placed later this week.  Return to clinic in 1 week for further evaluation and reconsideration of cycle 6, day 1.  2.  Sinus congestion: Resolved.  Possibly related to COVID infection. 3.  Diarrhea: Resolved. 4.  Neutropenia: Recurrent.  Continue Ziextenzo in 2 days and then after day 8 of each cycle. 5.  Anemia: Chronic and unchanged.  Patient's hemoglobin is 9.5 today. 6.  Thrombocytosis: Solved. 7.  Flank pain: Patient does not complain of this today.  Likely secondary to malignancy.  Abdominal ultrasound was unrevealing.   8.  Elevated liver enzymes: Chronic and unchanged.  Delay treatment as above secondary to IV access. 9.  Acute renal failure: Resolved. 10.  Hyperglycemia: Improved, monitor. 11.  UTI: Patient was given a prescription for Levaquin.  Patient expressed understanding and was in agreement with this plan. She also understands that She can call clinic at any time with any questions, concerns, or complaints.    Cancer Staging Intrahepatic cholangiocarcinoma  ([place]) Staging form: Intrahepatic Bile Duct, AJCC 8th Edition - Clinical stage from 08/04/2020: Stage IV (cT1b, cN0, pM1) - Signed by Lloyd Huger, MD on 08/04/2020   Lloyd Huger, MD   12/28/2020 6:49 AM

## 2020-12-27 ENCOUNTER — Ambulatory Visit: Payer: BC Managed Care – PPO

## 2020-12-27 ENCOUNTER — Other Ambulatory Visit: Payer: Self-pay

## 2020-12-27 ENCOUNTER — Inpatient Hospital Stay (HOSPITAL_BASED_OUTPATIENT_CLINIC_OR_DEPARTMENT_OTHER): Payer: BC Managed Care – PPO | Admitting: Oncology

## 2020-12-27 ENCOUNTER — Encounter: Payer: Self-pay | Admitting: Oncology

## 2020-12-27 ENCOUNTER — Inpatient Hospital Stay: Payer: BC Managed Care – PPO | Attending: Oncology

## 2020-12-27 ENCOUNTER — Inpatient Hospital Stay: Payer: BC Managed Care – PPO

## 2020-12-27 VITALS — Resp 18

## 2020-12-27 VITALS — BP 122/75 | HR 75 | Temp 98.4°F | Wt 161.4 lb

## 2020-12-27 DIAGNOSIS — R399 Unspecified symptoms and signs involving the genitourinary system: Secondary | ICD-10-CM | POA: Diagnosis not present

## 2020-12-27 DIAGNOSIS — N390 Urinary tract infection, site not specified: Secondary | ICD-10-CM | POA: Diagnosis not present

## 2020-12-27 DIAGNOSIS — Z801 Family history of malignant neoplasm of trachea, bronchus and lung: Secondary | ICD-10-CM | POA: Insufficient documentation

## 2020-12-27 DIAGNOSIS — C221 Intrahepatic bile duct carcinoma: Secondary | ICD-10-CM | POA: Diagnosis not present

## 2020-12-27 DIAGNOSIS — C220 Liver cell carcinoma: Secondary | ICD-10-CM | POA: Diagnosis not present

## 2020-12-27 DIAGNOSIS — Z5111 Encounter for antineoplastic chemotherapy: Secondary | ICD-10-CM | POA: Diagnosis not present

## 2020-12-27 DIAGNOSIS — E1165 Type 2 diabetes mellitus with hyperglycemia: Secondary | ICD-10-CM | POA: Diagnosis not present

## 2020-12-27 DIAGNOSIS — D649 Anemia, unspecified: Secondary | ICD-10-CM | POA: Insufficient documentation

## 2020-12-27 DIAGNOSIS — D709 Neutropenia, unspecified: Secondary | ICD-10-CM | POA: Insufficient documentation

## 2020-12-27 DIAGNOSIS — Z800 Family history of malignant neoplasm of digestive organs: Secondary | ICD-10-CM | POA: Diagnosis not present

## 2020-12-27 DIAGNOSIS — Z5189 Encounter for other specified aftercare: Secondary | ICD-10-CM | POA: Insufficient documentation

## 2020-12-27 LAB — URINALYSIS, COMPLETE (UACMP) WITH MICROSCOPIC
Bilirubin Urine: NEGATIVE
Glucose, UA: 50 mg/dL — AB
Hgb urine dipstick: NEGATIVE
Ketones, ur: NEGATIVE mg/dL
Nitrite: POSITIVE — AB
Protein, ur: NEGATIVE mg/dL
Specific Gravity, Urine: 1.011 (ref 1.005–1.030)
WBC, UA: >50 WBC/hpf — ABNORMAL HIGH (ref 0–5)
pH: 5 (ref 5.0–8.0)

## 2020-12-27 LAB — COMPREHENSIVE METABOLIC PANEL
ALT: 54 U/L — ABNORMAL HIGH (ref 0–44)
AST: 119 U/L — ABNORMAL HIGH (ref 15–41)
Albumin: 3.4 g/dL — ABNORMAL LOW (ref 3.5–5.0)
Alkaline Phosphatase: 128 U/L — ABNORMAL HIGH (ref 38–126)
Anion gap: 8 (ref 5–15)
BUN: 15 mg/dL (ref 6–20)
CO2: 26 mmol/L (ref 22–32)
Calcium: 8.7 mg/dL — ABNORMAL LOW (ref 8.9–10.3)
Chloride: 104 mmol/L (ref 98–111)
Creatinine, Ser: 1.08 mg/dL — ABNORMAL HIGH (ref 0.44–1.00)
GFR, Estimated: 60 mL/min — ABNORMAL LOW (ref >60)
Glucose, Bld: 160 mg/dL — ABNORMAL HIGH (ref 70–99)
Potassium: 4.2 mmol/L (ref 3.5–5.1)
Sodium: 138 mmol/L (ref 135–145)
Total Bilirubin: 0.6 mg/dL (ref 0.3–1.2)
Total Protein: 7.3 g/dL (ref 6.5–8.1)

## 2020-12-27 LAB — CBC WITH DIFFERENTIAL/PLATELET
Abs Immature Granulocytes: 0.15 10*3/uL — ABNORMAL HIGH (ref 0.00–0.07)
Basophils Absolute: 0.1 10*3/uL (ref 0.0–0.1)
Basophils Relative: 1 %
Eosinophils Absolute: 0.2 10*3/uL (ref 0.0–0.5)
Eosinophils Relative: 2 %
HCT: 30.4 % — ABNORMAL LOW (ref 36.0–46.0)
Hemoglobin: 9.5 g/dL — ABNORMAL LOW (ref 12.0–15.0)
Immature Granulocytes: 1 %
Lymphocytes Relative: 15 %
Lymphs Abs: 1.5 10*3/uL (ref 0.7–4.0)
MCH: 29.4 pg (ref 26.0–34.0)
MCHC: 31.3 g/dL (ref 30.0–36.0)
MCV: 94.1 fL (ref 80.0–100.0)
Monocytes Absolute: 1.3 10*3/uL — ABNORMAL HIGH (ref 0.1–1.0)
Monocytes Relative: 12 %
Neutro Abs: 7.2 10*3/uL (ref 1.7–7.7)
Neutrophils Relative %: 69 %
Platelets: 360 10*3/uL (ref 150–400)
RBC: 3.23 MIL/uL — ABNORMAL LOW (ref 3.87–5.11)
RDW: 19.5 % — ABNORMAL HIGH (ref 11.5–15.5)
WBC: 10.4 10*3/uL (ref 4.0–10.5)
nRBC: 0 % (ref 0.0–0.2)

## 2020-12-27 MED ORDER — SODIUM CHLORIDE 0.9 % IV SOLN
1800.0000 mg | Freq: Once | INTRAVENOUS | Status: DC
  Filled 2020-12-27: qty 47

## 2020-12-27 MED ORDER — FOSAPREPITANT DIMEGLUMINE INJECTION 150 MG
150.0000 mg | Freq: Once | INTRAVENOUS | Status: AC
  Administered 2020-12-27: 150 mg via INTRAVENOUS
  Filled 2020-12-27: qty 150

## 2020-12-27 MED ORDER — POTASSIUM CHLORIDE IN NACL 20-0.9 MEQ/L-% IV SOLN
Freq: Once | INTRAVENOUS | Status: AC
Start: 2020-12-27 — End: 2020-12-27
  Filled 2020-12-27: qty 1000

## 2020-12-27 MED ORDER — SODIUM CHLORIDE 0.9 % IV SOLN
Freq: Once | INTRAVENOUS | Status: AC
  Filled 2020-12-27: qty 250

## 2020-12-27 MED ORDER — PALONOSETRON HCL INJECTION 0.25 MG/5ML
0.2500 mg | Freq: Once | INTRAVENOUS | Status: AC
  Administered 2020-12-27: 0.25 mg via INTRAVENOUS
  Filled 2020-12-27: qty 5

## 2020-12-27 MED ORDER — DEXAMETHASONE SODIUM PHOSPHATE 100 MG/10ML IJ SOLN
10.0000 mg | Freq: Once | INTRAMUSCULAR | Status: AC
Start: 2020-12-27 — End: 2020-12-27
  Administered 2020-12-27: 10 mg via INTRAVENOUS
  Filled 2020-12-27: qty 10

## 2020-12-27 MED ORDER — SODIUM CHLORIDE 0.9 % IV SOLN
80.0000 mg/m2 | Freq: Once | INTRAVENOUS | Status: DC
  Filled 2020-12-27: qty 148

## 2020-12-27 MED ORDER — MAGNESIUM SULFATE 2 GM/50ML IV SOLN
2.0000 g | Freq: Once | INTRAVENOUS | Status: AC
  Administered 2020-12-27: 2 g via INTRAVENOUS
  Filled 2020-12-27: qty 50

## 2020-12-27 MED ORDER — SODIUM CHLORIDE 0.9 % IV SOLN
Freq: Once | INTRAVENOUS | Status: DC
  Filled 2020-12-27: qty 250

## 2020-12-27 NOTE — Progress Notes (Signed)
AST: 119. Also, Magnesium lab was not drawn today. MD, Dr. [name], notified and aware. Per MD order: Magnesium lab does not need to be done today, MD is adding order for next week; okay to proceed with scheduled Gemzar and Cisplatin treatment at this time.  1426- Patient reported pain to left hand peripheral IV site and left arm. IV flushes without difficulty and yields a positive blood return. No swelling or redness noted at IV site or left arm. Left hand and left arm are cold to touch. 0.9% Sodium Chloride infusion stopped and peripheral IV site removed. Haywood Pao, RN, attempted 2 more peripheral IV sticks, but unable to initiate IV access. Patient has not received Cisplatin or Gemzar treatment yet today. MD, Dr. [name], notified and aware. Per MD order: hold remainder of treatment today and discuss port-a-cath placement with patient. Staff educated patient on port-a-cath placement and patient is agreeable to getting port placement set up. MD team is working to get patient scheduled for port placement and will contact patient with upcoming appointments once scheduled. Patient educated and verbalized understanding.    1509- Per MD, Dr. [name], order: patient may be discharged to home at this time. Patient reports her left hand and left arm are feeling better and are no longer painful or cold. Left hand and left arm remain without redness or swelling. Patient encouraged to call clinic if any swelling, redness, or pain develops again to left hand or left arm. Patient verbalized understanding and discharged to home.

## 2020-12-27 NOTE — Progress Notes (Signed)
Patient here for pre treatment check she reports burning with urination. She has an appointment tomorrow with her PCP

## 2020-12-28 ENCOUNTER — Other Ambulatory Visit: Payer: Self-pay | Admitting: *Deleted

## 2020-12-28 ENCOUNTER — Telehealth: Payer: Self-pay | Admitting: *Deleted

## 2020-12-28 MED ORDER — LEVOFLOXACIN 500 MG PO TABS: 500.0000 mg | ORAL_TABLET | Freq: Every day | ORAL | 0 refills | Status: DC

## 2020-12-28 NOTE — Telephone Encounter (Signed)
Call placed to patients husband to review results of UA from yesterday, patient has UTI. Prescription for Levaquin 500 mg daily for 5 days sent to patients pharmacy per order from Dr. [name]. Patients husband verbalized understanding of plan and states he will pick up prescription today.

## 2020-12-29 ENCOUNTER — Telehealth (INDEPENDENT_AMBULATORY_CARE_PROVIDER_SITE_OTHER): Payer: Self-pay

## 2020-12-29 NOTE — Telephone Encounter (Signed)
Spoke with the patient and she is scheduled for a port placement on 01/04/21 with Dr. Lucky Cowboy and a 9:45 am arrival time to the [facility]. Covid testing on 01/02/21 between 8-2 pm at the [facility]. Pre-procedure instructions were discussed and will be mailed.

## 2020-12-30 LAB — URINE CULTURE: Culture: >=100000 — AB

## 2020-12-30 NOTE — Progress Notes (Deleted)
[place]  [phone] Fax:(336) [phone]  ID: [name] OB: 07-05-1961  MR#: [id number]  CSN#:[id number]  Patient Care Team: [name], MD as PCP - General (Family Medicine) Patient, No Pcp Per (Inactive) (General Practice) [name], RN as Oncology Nurse Navigator [name], [name], MD as Consulting Physician (Oncology)   CHIEF COMPLAINT: Stage IV intrahepatic cholangiocarcinoma.  INTERVAL HISTORY: Patient returns to clinic today for further evaluation and consideration of cycle 6, day 1 of cisplatin and gemcitabine.  She has noticed lower abdominal discomfort and feels like she may have a UTI, but no urinary symptoms.  She continues to have mild weakness and fatigue.  She does not complain of pain today. She has no neurologic complaints.  She has no chest pain, shortness of breath, cough, or hemoptysis.  She denies any nausea, vomiting, constipation, or diarrhea.  She has no melena or hematochezia.  She has no urinary complaints.  Patient offers no further specific complaints today.  REVIEW OF SYSTEMS:   Review of Systems  Constitutional: Negative.  Negative for fever, malaise/fatigue and weight loss.  HENT: Negative.  Negative for congestion and sinus pain.   Respiratory: Negative.  Negative for cough, hemoptysis and shortness of breath.   Cardiovascular: Negative.  Negative for chest pain and leg swelling.  Gastrointestinal: Negative.  Negative for abdominal pain, blood in stool, constipation, diarrhea, melena, nausea and vomiting.  Genitourinary: Negative.  Negative for dysuria and flank pain.  Musculoskeletal: Negative.  Negative for back pain.  Skin: Negative.  Negative for rash.  Neurological: Negative.  Negative for dizziness, focal weakness, weakness and headaches.  Psychiatric/Behavioral: Negative.  The patient is not nervous/anxious.     As per HPI. Otherwise, a complete review of systems is negative.  PAST MEDICAL  HISTORY: Past Medical History:  Diagnosis Date  . Abnormal Pap smear of cervix   . Anemia    h/o  . Diabetes mellitus without complication ([place])     PAST SURGICAL HISTORY: Past Surgical History:  Procedure Laterality Date  . COLONOSCOPY WITH PROPOFOL N/A 06/15/2020   Procedure: COLONOSCOPY WITH PROPOFOL;  Surgeon: Lin Landsman, MD;  Location: [facility];  Service: Endoscopy;  Laterality: N/A;  Diabetic - oral meds  . ENDOMETRIAL ABLATION    . HYSTEROSCOPY WITH D & C    . LEEP      FAMILY HISTORY: Family History  Problem Relation Age of Onset  . Bleeding Disorder Mother   . Biliary Cirrhosis Mother   . Liver cancer Mother   . Hypertension Mother   . Diabetes Father   . Lung cancer Father   . Breast cancer Neg Hx     ADVANCED DIRECTIVES (Y/N):  N  HEALTH MAINTENANCE: Social History   Tobacco Use  . Smoking status: Never Smoker  . Smokeless tobacco: Never Used  Vaping Use  . Vaping Use: Never used  Substance Use Topics  . Alcohol use: No  . Drug use: No     Colonoscopy:  PAP:  Bone density:  Lipid panel:  No Known Allergies  Current Outpatient Medications  Medication Sig Dispense Refill  . acetaminophen (TYLENOL) 500 MG tablet Take 500 mg by mouth every 6 (six) hours as needed.    Marland Kitchen HYDROcodone-acetaminophen (NORCO/VICODIN) 5-325 MG tablet Take 1 tablet by mouth every 4 (four) hours as needed for moderate pain. 60 tablet 0  . levofloxacin (LEVAQUIN) 500 MG tablet Take 1 tablet (500 mg total) by mouth daily. 5 tablet  0  . metFORMIN (GLUCOPHAGE) 500 MG tablet Take 500 mg by mouth 2 (two) times daily.    . ondansetron (ZOFRAN) 8 MG tablet Take 1 tablet (8 mg total) by mouth every 8 (eight) hours as needed for nausea or vomiting. 30 tablet 2   No current facility-administered medications for this visit.    OBJECTIVE: There were no vitals filed for this visit.   There is no height or weight on file to calculate BMI.    ECOG FS:0 - Asymptomatic    General: Well-developed, well-nourished, no acute distress. Eyes: Pink conjunctiva, anicteric sclera. HEENT: Normocephalic, moist mucous membranes. Lungs: No audible wheezing or coughing. Heart: Regular rate and rhythm. Abdomen: Soft, nontender, no obvious distention. Musculoskeletal: No edema, cyanosis, or clubbing. Neuro: Alert, answering all questions appropriately. Cranial nerves grossly intact. Skin: No rashes or petechiae noted. Psych: Normal affect.   LAB RESULTS:  Lab Results  Component Value Date   NA 138 12/27/2020   K 4.2 12/27/2020   CL 104 12/27/2020   CO2 26 12/27/2020   GLUCOSE 160 (H) 12/27/2020   BUN 15 12/27/2020   CREATININE 1.08 (H) 12/27/2020   CALCIUM 8.7 (L) 12/27/2020   PROT 7.3 12/27/2020   ALBUMIN 3.4 (L) 12/27/2020   AST 119 (H) 12/27/2020   ALT 54 (H) 12/27/2020   ALKPHOS 128 (H) 12/27/2020   BILITOT 0.6 12/27/2020   GFRNONAA 60 (L) 12/27/2020   GFRAA >60 05/23/2020    Lab Results  Component Value Date   WBC 10.4 12/27/2020   NEUTROABS 7.2 12/27/2020   HGB 9.5 (L) 12/27/2020   HCT 30.4 (L) 12/27/2020   MCV 94.1 12/27/2020   PLT 360 12/27/2020     STUDIES: MR LIVER W WO CONTRAST  Result Date: 12/01/2020 CLINICAL DATA:  Assess treatment response of cholangiocarcinoma. EXAM: MRI ABDOMEN WITHOUT AND WITH CONTRAST TECHNIQUE: Multiplanar multisequence MR imaging of the abdomen was performed both before and after the administration of intravenous contrast. CONTRAST:  58mL GADAVIST GADOBUTROL 1 MMOL/ML IV SOLN COMPARISON:  July 20, 2020 MRI evaluation. FINDINGS: Lower chest: Incidental imaging of the lung bases on MRI is unremarkable on limited assessment. Hepatobiliary: Diffuse abnormal enhancement in the RIGHT hepatic lobe in hepatic subsegment V shows fatty sparing and hyperenhancement. Within the area of regional hyperenhancement is a more focal area of enhancement displaying some washout, this is along the gallbladder fossa best measured  on image 54 of series 14 1.2 x 1.9 cm. This area was previously approximately 1.4 x 1.0 cm. Gallbladder fossa is partially obscured by susceptibility artifact from adjacent bowel gas. The lesion in this area may be smaller measuring approximately 1.3 cm as compared to 1.8 cm previously. Discrete hepatic lesion more central and cephalad seen only as a subtle signal abnormality on delayed imaging, showing increased and persistent enhancement rather than hypoenhancement centrally and is not visible distinctly as a hypointense area on the arterial phase as it was on the prior study. No new lesions are demonstrated. Pancreas: Normal intrinsic T1 signal. No ductal dilation or sign of inflammation. No focal lesion. Spleen:  Normal spleen. Adrenals/Urinary Tract:  Adrenal glands are normal. Symmetric renal enhancement.  No hydronephrosis. Stomach/Bowel: Gastrointestinal tract without acute process on limited assessment. Vascular/Lymphatic: Patent portal vein. Normal caliber abdominal aorta. Persistent LEFT-sided IVC. Other:  No ascites. Musculoskeletal: No suspicious bone lesions identified. IMPRESSION: 1. Area of transient hepatic intensity difference surrounding lesions in the gallbladder fossa and hepatic subsegment V which appear to be improving though areas in  the gallbladder fossa show limited assessment due to adjacent bowel gas and susceptibility related artifact. 2. The discrete more central lesion seen on the previous imaging study has changed in character, potentially related to healing of lesion resolving in the liver. Attention on follow-up. 3. Periportal lymph nodes are diminished in size measuring less than a cm on the current examination. Attention on follow-up. 4. Persistent LEFT-sided IVC. Electronically Signed   By: Zetta Bills M.D.   On: 12/01/2020 14:08    ASSESSMENT: Stage IV intrahepatic cholangiocarcinoma.  PLAN:    1.  Stage IV intrahepatic cholangiocarcinoma: Incidental finding on routine  cholecystectomy. PET scan results from July 13, 2020 as well as MRI results from July 20, 2020 reviewed independently with right lobe liver lesion consistent with intrahepatic cholangiocarcinoma.  Biopsy confirmed the results.  Gynecologic work-up is essentially negative.  Colonoscopy on June 15, 2020 was essentially within normal limits.  Mammogram on July 19, 2020 was reported as BI-RADS 2. All patient tumor markers are negative.  Patient has declined port placement at this time.  Plan to give cisplatin and gemcitabine on day 1 with gemcitabine only on day 8.  This will be a 21-day cycle.  Because of a history of neutropenia, patient will require Ziextenzo after day 8 of treatment.  Repeat MRI on December 01, 2020 reviewed independently and report as above with overall improvement of disease burden, but likely residual malignancy.  Proceed with 4 additional cycles and then reimage in June 2022.  Delay cycle 6, day 1 of treatment secondary to IV access and patient will have a port placed later this week.  Return to clinic in 1 week for further evaluation and reconsideration of cycle 6, day 1.  2.  Sinus congestion: Resolved.  Possibly related to COVID infection. 3.  Diarrhea: Resolved. 4.  Neutropenia: Recurrent.  Continue Ziextenzo in 2 days and then after day 8 of each cycle. 5.  Anemia: Chronic and unchanged.  Patient's hemoglobin is 9.5 today. 6.  Thrombocytosis: Solved. 7.  Flank pain: Patient does not complain of this today.  Likely secondary to malignancy.  Abdominal ultrasound was unrevealing.   8.  Elevated liver enzymes: Chronic and unchanged.  Delay treatment as above secondary to IV access. 9.  Acute renal failure: Resolved. 10.  Hyperglycemia: Improved, monitor. 11.  UTI: Patient was given a prescription for Levaquin.  Patient expressed understanding and was in agreement with this plan. She also understands that She can call clinic at any time with any questions, concerns, or  complaints.    Cancer Staging Intrahepatic cholangiocarcinoma ([place]) Staging form: Intrahepatic Bile Duct, AJCC 8th Edition - Clinical stage from 08/04/2020: Stage IV (cT1b, cN0, pM1) - Signed by Lloyd Huger, MD on 08/04/2020   Lloyd Huger, MD   12/30/2020 12:17 PM

## 2021-01-02 ENCOUNTER — Other Ambulatory Visit (INDEPENDENT_AMBULATORY_CARE_PROVIDER_SITE_OTHER): Payer: Self-pay | Admitting: Nurse Practitioner

## 2021-01-02 ENCOUNTER — Telehealth: Payer: Self-pay | Admitting: *Deleted

## 2021-01-02 ENCOUNTER — Other Ambulatory Visit: Payer: Self-pay

## 2021-01-02 ENCOUNTER — Other Ambulatory Visit
Admission: RE | Admit: 2021-01-02 | Discharge: 2021-01-02 | Disposition: A | Discharge status: Home / Self Care | Payer: BC Managed Care – PPO | Source: Ambulatory Visit | Attending: Vascular Surgery | Admitting: Vascular Surgery

## 2021-01-02 DIAGNOSIS — Z01812 Encounter for preprocedural laboratory examination: Secondary | ICD-10-CM | POA: Diagnosis not present

## 2021-01-02 DIAGNOSIS — Z20822 Contact with and (suspected) exposure to covid-19: Secondary | ICD-10-CM | POA: Diagnosis not present

## 2021-01-02 LAB — SARS CORONAVIRUS 2 (TAT 6-24 HRS): SARS Coronavirus 2: NEGATIVE

## 2021-01-02 NOTE — Telephone Encounter (Signed)
Spoke with the patients husband and he is aware of her updated appts for 4/20.

## 2021-01-02 NOTE — Telephone Encounter (Signed)
Called patient to advise her that her treatment will be rescheduled after her port is placed. Scheduler with call with new appointment.

## 2021-01-03 ENCOUNTER — Ambulatory Visit: Payer: BC Managed Care – PPO

## 2021-01-03 ENCOUNTER — Inpatient Hospital Stay: Payer: BC Managed Care – PPO

## 2021-01-03 ENCOUNTER — Other Ambulatory Visit: Payer: BC Managed Care – PPO

## 2021-01-03 ENCOUNTER — Inpatient Hospital Stay: Payer: BC Managed Care – PPO | Admitting: Oncology

## 2021-01-03 ENCOUNTER — Ambulatory Visit: Payer: BC Managed Care – PPO | Admitting: Oncology

## 2021-01-03 DIAGNOSIS — C221 Intrahepatic bile duct carcinoma: Secondary | ICD-10-CM

## 2021-01-04 ENCOUNTER — Other Ambulatory Visit: Payer: Self-pay

## 2021-01-04 ENCOUNTER — Encounter
Admission: RE | Disposition: A | Discharge status: Home / Self Care | Payer: Self-pay | Source: Home / Self Care | Attending: Vascular Surgery

## 2021-01-04 ENCOUNTER — Ambulatory Visit
Admission: RE | Admit: 2021-01-04 | Discharge: 2021-01-04 | Disposition: A | Discharge status: Home / Self Care | Payer: BC Managed Care – PPO | Attending: Vascular Surgery | Admitting: Vascular Surgery

## 2021-01-04 ENCOUNTER — Encounter: Payer: Self-pay | Admitting: Vascular Surgery

## 2021-01-04 DIAGNOSIS — C221 Intrahepatic bile duct carcinoma: Secondary | ICD-10-CM

## 2021-01-04 DIAGNOSIS — D649 Anemia, unspecified: Secondary | ICD-10-CM | POA: Insufficient documentation

## 2021-01-04 DIAGNOSIS — Z7984 Long term (current) use of oral hypoglycemic drugs: Secondary | ICD-10-CM | POA: Diagnosis not present

## 2021-01-04 HISTORY — DX: Intrahepatic bile duct carcinoma: C22.1

## 2021-01-04 HISTORY — PX: PORTA CATH INSERTION: CATH118285

## 2021-01-04 SURGERY — PORTA CATH INSERTION
Anesthesia: Moderate Sedation

## 2021-01-04 MED ORDER — METHYLPREDNISOLONE SODIUM SUCC 125 MG IJ SOLR: 125.0000 mg | Freq: Once | INTRAMUSCULAR | Status: DC | PRN

## 2021-01-04 MED ORDER — MIDAZOLAM HCL 2 MG/2ML IJ SOLN
INTRAMUSCULAR | Status: DC | PRN
  Administered 2021-01-04: 2 mg via INTRAVENOUS

## 2021-01-04 MED ORDER — ONDANSETRON HCL 4 MG/2ML IJ SOLN: 4.0000 mg | Freq: Four times a day (QID) | INTRAMUSCULAR | Status: DC | PRN

## 2021-01-04 MED ORDER — SODIUM CHLORIDE 0.9 % IV SOLN: INTRAVENOUS | Status: DC

## 2021-01-04 MED ORDER — CHLORHEXIDINE GLUCONATE CLOTH 2 % EX PADS
6.0000 | MEDICATED_PAD | Freq: Every day | CUTANEOUS | Status: DC
  Administered 2021-01-04: 6 via TOPICAL

## 2021-01-04 MED ORDER — HEPARIN SODIUM (PORCINE) 1000 UNIT/ML IJ SOLN
INTRAMUSCULAR | Status: AC
  Filled 2021-01-04: qty 1

## 2021-01-04 MED ORDER — MIDAZOLAM HCL 5 MG/5ML IJ SOLN
INTRAMUSCULAR | Status: AC
  Filled 2021-01-04: qty 5

## 2021-01-04 MED ORDER — FAMOTIDINE 20 MG PO TABS: 40.0000 mg | ORAL_TABLET | Freq: Once | ORAL | Status: DC | PRN

## 2021-01-04 MED ORDER — DIPHENHYDRAMINE HCL 50 MG/ML IJ SOLN: 50.0000 mg | Freq: Once | INTRAMUSCULAR | Status: DC | PRN

## 2021-01-04 MED ORDER — FENTANYL CITRATE (PF) 100 MCG/2ML IJ SOLN
INTRAMUSCULAR | Status: AC
  Filled 2021-01-04: qty 2

## 2021-01-04 MED ORDER — CEFAZOLIN SODIUM-DEXTROSE 2-4 GM/100ML-% IV SOLN
INTRAVENOUS | Status: AC
  Filled 2021-01-04: qty 100

## 2021-01-04 MED ORDER — MIDAZOLAM HCL 2 MG/ML PO SYRP: 8.0000 mg | ORAL_SOLUTION | Freq: Once | ORAL | Status: DC | PRN

## 2021-01-04 MED ORDER — HYDROMORPHONE HCL 1 MG/ML IJ SOLN: 1.0000 mg | Freq: Once | INTRAMUSCULAR | Status: DC | PRN

## 2021-01-04 MED ORDER — CEFAZOLIN SODIUM-DEXTROSE 2-4 GM/100ML-% IV SOLN: 2.0000 g | INTRAVENOUS | Status: DC

## 2021-01-04 MED ORDER — SODIUM CHLORIDE 0.9 % IV SOLN
Freq: Once | INTRAVENOUS | Status: DC
  Filled 2021-01-04: qty 2

## 2021-01-04 MED ORDER — FENTANYL CITRATE (PF) 100 MCG/2ML IJ SOLN
INTRAMUSCULAR | Status: DC | PRN
  Administered 2021-01-04: 50 ug via INTRAVENOUS

## 2021-01-04 SURGICAL SUPPLY — 10 items
CATH PORTA XCELA P 8FR SL FA (Port) ×2 IMPLANT
COVER PROBE U/S 5X48 (MISCELLANEOUS) ×2 IMPLANT
DERMABOND ADVANCED (GAUZE/BANDAGES/DRESSINGS) ×1
DERMABOND ADVANCED .7 DNX12 (GAUZE/BANDAGES/DRESSINGS) ×1 IMPLANT
PACK ANGIOGRAPHY (CUSTOM PROCEDURE TRAY) ×2 IMPLANT
PENCIL ELECTRO HAND CTR (MISCELLANEOUS) ×2 IMPLANT
SPONGE XRAY 4X4 16PLY STRL (MISCELLANEOUS) ×2 IMPLANT
SUT MNCRL AB 4-0 PS2 18 (SUTURE) ×2 IMPLANT
SUT VIC AB 3-0 SH 27 (SUTURE) ×1
SUT VIC AB 3-0 SH 27X BRD (SUTURE) ×1 IMPLANT

## 2021-01-04 NOTE — Discharge Instructions (Signed)
Implanted Port Insertion, Care After This sheet gives you information about how to care for yourself after your procedure. Your health care provider may also give you more specific instructions. If you have problems or questions, contact your health care provider. What can I expect after the procedure? After the procedure, it is common to have:  Discomfort at the port insertion site.  Bruising on the skin over the port. This should improve over 3-4 days. Follow these instructions at home: Port care  After your port is placed, you will get a manufacturer's information card. The card has information about your port. Keep this card with you at all times.  Take care of the port as told by your health care provider. Ask your health care provider if you or a family member can get training for taking care of the port at home. A home health care nurse may also take care of the port.  Make sure to remember what type of port you have. Incision care  Follow instructions from your health care provider about how to take care of your port insertion site. Make sure you: ? Wash your hands with soap and water before and after you change your bandage (dressing). If soap and water are not available, use hand sanitizer. ? Change your dressing as told by your health care provider. ? Leave stitches (sutures), skin glue, or adhesive strips in place. These skin closures may need to stay in place for 2 weeks or longer. If adhesive strip edges start to loosen and curl up, you may trim the loose edges. Do not remove adhesive strips completely unless your health care provider tells you to do that.  Check your port insertion site every day for signs of infection. Check for: ? Redness, swelling, or pain. ? Fluid or blood. ? Warmth. ? Pus or a bad smell.      Activity  Return to your normal activities as told by your health care provider. Ask your health care provider what activities are safe for you.  Do not  lift anything that is heavier than 10 lb (4.5 kg), or the limit that you are told, until your health care provider says that it is safe. General instructions  Take over-the-counter and prescription medicines only as told by your health care provider.  Do not take baths, swim, or use a hot tub until your health care provider approves. Ask your health care provider if you may take showers. You may only be allowed to take sponge baths.  Do not drive for 24 hours if you were given a sedative during your procedure.  Wear a medical alert bracelet in case of an emergency. This will tell any health care providers that you have a port.  Keep all follow-up visits as told by your health care provider. This is important. Contact a health care provider if:  You cannot flush your port with saline as directed, or you cannot draw blood from the port.  You have a fever or chills.  You have redness, swelling, or pain around your port insertion site.  You have fluid or blood coming from your port insertion site.  Your port insertion site feels warm to the touch.  You have pus or a bad smell coming from the port insertion site. Get help right away if:  You have chest pain or shortness of breath.  You have bleeding from your port that you cannot control. Summary  Take care of the port as told by your   health care provider. Keep the manufacturer's information card with you at all times.  Change your dressing as told by your health care provider.  Contact a health care provider if you have a fever or chills or if you have redness, swelling, or pain around your port insertion site.  Keep all follow-up visits as told by your health care provider. This information is not intended to replace advice given to you by your health care provider. Make sure you discuss any questions you have with your health care provider. Document Revised: 04/07/2018 Document Reviewed: 04/07/2018 Elsevier Patient Education   2021 Elsevier Inc.  Moderate Conscious Sedation, Adult, Care After This sheet gives you information about how to care for yourself after your procedure. Your health care provider may also give you more specific instructions. If you have problems or questions, contact your health care provider. What can I expect after the procedure? After the procedure, it is common to have:  Sleepiness for several hours.  Impaired judgment for several hours.  Difficulty with balance.  Vomiting if you eat too soon. Follow these instructions at home: For the time period you were told by your health care provider:  Rest.  Do not participate in activities where you could fall or become injured.  Do not drive or use machinery.  Do not drink alcohol.  Do not take sleeping pills or medicines that cause drowsiness.  Do not make important decisions or sign legal documents.  Do not take care of children on your own.      Eating and drinking  Follow the diet recommended by your health care provider.  Drink enough fluid to keep your urine pale yellow.  If you vomit: ? Drink water, juice, or soup when you can drink without vomiting. ? Make sure you have little or no nausea before eating solid foods.   General instructions  Take over-the-counter and prescription medicines only as told by your health care provider.  Have a responsible adult stay with you for the time you are told. It is important to have someone help care for you until you are awake and alert.  Do not smoke.  Keep all follow-up visits as told by your health care provider. This is important. Contact a health care provider if:  You are still sleepy or having trouble with balance after 24 hours.  You feel light-headed.  You keep feeling nauseous or you keep vomiting.  You develop a rash.  You have a fever.  You have redness or swelling around the IV site. Get help right away if:  You have trouble breathing.  You have  new-onset confusion at home. Summary  After the procedure, it is common to feel sleepy, have impaired judgment, or feel nauseous if you eat too soon.  Rest after you get home. Know the things you should not do after the procedure.  Follow the diet recommended by your health care provider and drink enough fluid to keep your urine pale yellow.  Get help right away if you have trouble breathing or new-onset confusion at home. This information is not intended to replace advice given to you by your health care provider. Make sure you discuss any questions you have with your health care provider. Document Revised: 01/07/2020 Document Reviewed: 08/05/2019 Elsevier Patient Education  2021 Elsevier Inc.  

## 2021-01-04 NOTE — Interval H&P Note (Signed)
History and Physical Interval Note:  01/04/2021 10:05 AM  Lauren Lindsey  has presented today for surgery, with the diagnosis of Porta Cath Placement   Intrahepatic carcinoma.  The various methods of treatment have been discussed with the patient and family. After consideration of risks, benefits and other options for treatment, the patient has consented to  Procedure(s): PORTA CATH INSERTION (N/A) as a surgical intervention.  The patient's history has been reviewed, patient examined, no change in status, stable for surgery.  I have reviewed the patient's chart and labs.  Questions were answered to the patient's satisfaction.     Leotis Pain

## 2021-01-04 NOTE — Op Note (Signed)
      [facility]       Operative Note  Date: 01/04/2021  Preoperative diagnosis:  1. cholangiocarcinoma  Postoperative diagnosis:  Same as above  Procedures: #1. Ultrasound guidance for vascular access to the right internal jugular vein. #2. Fluoroscopic guidance for placement of catheter. #3. Placement of CT compatible Port-A-Cath, right internal jugular vein.  Surgeon: Leotis Pain, MD.   Anesthesia: Local with moderate conscious sedation for approximately 21  minutes using 2 mg of Versed and 50 mcg of Fentanyl  Fluoroscopy time: less than 1 minute  Contrast used: 0  Estimated blood loss: 5 cc  Indication for the procedure:  The patient is a 60 y.o.female with cholangiocarcinoma.  The patient needs a Port-A-Cath for durable venous access, chemotherapy, lab draws, and CT scans. We are asked to place this. Risks and benefits were discussed and informed consent was obtained.  Description of procedure: The patient was brought to the vascular and interventional radiology suite.  Moderate conscious sedation was administered throughout the procedure during a face to face encounter with the patient with my supervision of the RN administering medicines and monitoring the patient's vital signs, pulse oximetry, telemetry and mental status throughout from the start of the procedure until the patient was taken to the recovery room. The right neck chest and shoulder were sterilely prepped and draped, and a sterile surgical field was created. Ultrasound was used to help visualize a patent right internal jugular vein. This was then accessed under direct ultrasound guidance without difficulty with the Seldinger needle and a permanent image was recorded. A J-wire was placed. After skin nick and dilatation, the peel-away sheath was then placed over the wire. I then anesthetized an area under the clavicle approximately 1-2 fingerbreadths. A transverse incision was created and an inferior  pocket was created with electrocautery and blunt dissection. The port was then brought onto the field, placed into the pocket and secured to the chest wall with 2 Prolene sutures. The catheter was connected to the port and tunneled from the subclavicular incision to the access site. Fluoroscopic guidance was then used to cut the catheter to an appropriate length. The catheter was then placed through the peel-away sheath and the peel-away sheath was removed. The catheter tip was parked in excellent location under fluorocoscopic guidance in the SVC just above the right atrium. The pocket was then irrigated with antibiotic impregnated saline and the wound was closed with a running 3-0 Vicryl and a 4-0 Monocryl. The access incision was closed with a single 4-0 Monocryl. The Huber needle was used to withdraw blood and flush the port with heparinized saline. Dermabond was then placed as a dressing. The patient tolerated the procedure well and was taken to the recovery room in stable condition.   Leotis Pain 01/04/2021 12:28 PM   This note was created with Dragon Medical transcription system. Any errors in dictation are purely unintentional.

## 2021-01-07 NOTE — Progress Notes (Signed)
[place]  [phone] [phone]) [phone]  ID: Lauren Lindsey OB: 1961-08-08  MR#: [id number]  OFB#:[id number]  Patient Care Team: [name], MD as PCP - General (Family Medicine) Patient, No Pcp Per (Inactive) (General Practice) [name], RN as Oncology Nurse Navigator [name], [name], MD as Consulting Physician (Oncology)   CHIEF COMPLAINT: Stage IV intrahepatic cholangiocarcinoma.  INTERVAL HISTORY: Patient returns to clinic today for further evaluation and reconsideration of cycle 6, day 1 of cisplatin and gemcitabine.  She now has had port placement and tolerated the procedure well.  She currently feels well and is asymptomatic.  She does not complain of any weakness or fatigue today. She does not complain of pain today. She has no neurologic complaints.  She has no chest pain, shortness of breath, cough, or hemoptysis.  She denies any nausea, vomiting, constipation, or diarrhea.  She has no melena or hematochezia.  She has no urinary complaints.  Patient offers no specific complaints today.  REVIEW OF SYSTEMS:   Review of Systems  Constitutional: Negative.  Negative for fever, malaise/fatigue and weight loss.  HENT: Negative.  Negative for congestion and sinus pain.   Respiratory: Negative.  Negative for cough, hemoptysis and shortness of breath.   Cardiovascular: Negative.  Negative for chest pain and leg swelling.  Gastrointestinal: Negative.  Negative for abdominal pain, blood in stool, constipation, diarrhea, melena, nausea and vomiting.  Genitourinary: Negative.  Negative for dysuria and flank pain.  Musculoskeletal: Negative.  Negative for back pain.  Skin: Negative.  Negative for rash.  Neurological: Negative.  Negative for dizziness, focal weakness, weakness and headaches.  Psychiatric/Behavioral: Negative.  The patient is not nervous/anxious.     As per HPI. Otherwise, a complete review of systems is negative.  PAST  MEDICAL HISTORY: Past Medical History:  Diagnosis Date  . Abnormal Pap smear of cervix   . Anemia    h/o  . Diabetes mellitus without complication ([place])   . Intrahepatic cholangiocarcinoma ([place])     PAST SURGICAL HISTORY: Past Surgical History:  Procedure Laterality Date  . CHOLECYSTECTOMY    . COLONOSCOPY WITH PROPOFOL N/A 06/15/2020   Procedure: COLONOSCOPY WITH PROPOFOL;  Surgeon: Lin Landsman, MD;  Location: [facility];  Service: Endoscopy;  Laterality: N/A;  Diabetic - oral meds  . ENDOMETRIAL ABLATION    . HYSTEROSCOPY WITH D & C    . LEEP    . PORTA CATH INSERTION N/A 01/04/2021   Procedure: PORTA CATH INSERTION;  Surgeon: Algernon Huxley, MD;  Location: [facility];  Service: Cardiovascular;  Laterality: N/A;    FAMILY HISTORY: Family History  Problem Relation Age of Onset  . Bleeding Disorder Mother   . Biliary Cirrhosis Mother   . Liver cancer Mother   . Hypertension Mother   . Diabetes Father   . Lung cancer Father   . Breast cancer Neg Hx     ADVANCED DIRECTIVES (Y/N):  N  HEALTH MAINTENANCE: Social History   Tobacco Use  . Smoking status: Never Smoker  . Smokeless tobacco: Never Used  Vaping Use  . Vaping Use: Never used  Substance Use Topics  . Alcohol use: No  . Drug use: No     Colonoscopy:  PAP:  Bone density:  Lipid panel:  No Known Allergies  Current Outpatient Medications  Medication Sig Dispense Refill  . acetaminophen (TYLENOL) 500 MG tablet Take 500 mg by mouth every 6 (six) hours as needed.    Marland Kitchen  levofloxacin (LEVAQUIN) 500 MG tablet Take 1 tablet (500 mg total) by mouth daily. 5 tablet 0  . lidocaine-prilocaine (EMLA) cream Apply 1 application topically as needed. 30 g 2  . metFORMIN (GLUCOPHAGE) 500 MG tablet Take 500 mg by mouth 2 (two) times daily.    . ondansetron (ZOFRAN) 8 MG tablet Take 1 tablet (8 mg total) by mouth every 8 (eight) hours as needed for nausea or vomiting. 30 tablet 2   No current  facility-administered medications for this visit.   Facility-Administered Medications Ordered in Other Visits  Medication Dose Route Frequency Provider Last Rate Last Admin  . 0.9 %  sodium chloride infusion   Intravenous Once Lloyd Huger, MD      . heparin lock flush 100 unit/mL  500 Units Intracatheter Once PRN Lloyd Huger, MD      . sodium chloride flush (NS) 0.9 % injection 10 mL  10 mL Intracatheter PRN Lloyd Huger, MD   10 mL at 01/10/21 1524    OBJECTIVE: Vitals:   01/10/21 0840  BP: 139/62  Pulse: 77  Resp: 18  Temp: (!) 79.8 F (26.6 C)  SpO2: 100%     Body mass index is 26.97 kg/m.    ECOG FS:0 - Asymptomatic   General: Well-developed, well-nourished, no acute distress. Eyes: Pink conjunctiva, anicteric sclera. HEENT: Normocephalic, moist mucous membranes. Lungs: No audible wheezing or coughing. Heart: Regular rate and rhythm. Abdomen: Soft, nontender, no obvious distention. Musculoskeletal: No edema, cyanosis, or clubbing. Neuro: Alert, answering all questions appropriately. Cranial nerves grossly intact. Skin: No rashes or petechiae noted. Psych: Normal affect.  LAB RESULTS:  Lab Results  Component Value Date   NA 136 01/10/2021   K 3.9 01/10/2021   CL 103 01/10/2021   CO2 24 01/10/2021   GLUCOSE 174 (H) 01/10/2021   BUN 17 01/10/2021   CREATININE 0.75 01/10/2021   CALCIUM 9.0 01/10/2021   PROT 7.6 01/10/2021   ALBUMIN 3.4 (L) 01/10/2021   AST 67 (H) 01/10/2021   ALT 53 (H) 01/10/2021   ALKPHOS 88 01/10/2021   BILITOT 0.6 01/10/2021   GFRNONAA >60 01/10/2021   GFRAA >60 05/23/2020    Lab Results  Component Value Date   WBC 9.4 01/10/2021   NEUTROABS 6.6 01/10/2021   HGB 10.4 (L) 01/10/2021   HCT 32.7 (L) 01/10/2021   MCV 92.4 01/10/2021   PLT 376 01/10/2021     STUDIES: PERIPHERAL VASCULAR CATHETERIZATION  Result Date: 01/04/2021 See op note   ASSESSMENT: Stage IV intrahepatic cholangiocarcinoma.  PLAN:     1.  Stage IV intrahepatic cholangiocarcinoma: Incidental finding on routine cholecystectomy. PET scan results from July 13, 2020 as well as MRI results from July 20, 2020 reviewed independently with right lobe liver lesion consistent with intrahepatic cholangiocarcinoma.  Biopsy confirmed the results.  Gynecologic work-up is essentially negative.  Colonoscopy on June 15, 2020 was essentially within normal limits.  Mammogram on July 19, 2020 was reported as BI-RADS 2. All patient tumor markers are negative.  Patient has now had port placement secondary to poor IV access.  Plan to give cisplatin and gemcitabine on day 1 with gemcitabine only on day 8.  This will be a 21-day cycle.  Because of a history of neutropenia, patient will require Ziextenzo after day 8 of treatment.  Repeat MRI on December 01, 2020 reviewed independently with overall improvement of disease burden, but likely residual malignancy.  Proceed with 4 additional cycles and then reimage in June 2022.  Proceed with cycle 6, day 1 of treatment today.  Return to clinic in 1 week for further evaluation and consideration of cycle 6, day 8 which is gemcitabine only. 2.  Neutropenia: Recurrent.  Continue Ziextenzo after day 8 of each cycle. 3.  Anemia: Hemoglobin mildly improved to 10.4. 4.   Flank pain: Patient does not complain of this today.  Likely secondary to malignancy.  Abdominal ultrasound was unrevealing.   5.  Elevated liver enzymes: Chronic and unchanged. 6.  Hyperglycemia: Patient has improved blood glucose control. 7.  UTI: Resolved with Levaquin.  Patient expressed understanding and was in agreement with this plan. She also understands that She can call clinic at any time with any questions, concerns, or complaints.    Cancer Staging Intrahepatic cholangiocarcinoma ([place]) Staging form: Intrahepatic Bile Duct, AJCC 8th Edition - Clinical stage from 08/04/2020: Stage IV (cT1b, cN0, pM1) - Signed by Lloyd Huger, MD on 08/04/2020   Lloyd Huger, MD   01/10/2021 4:03 PM

## 2021-01-10 ENCOUNTER — Inpatient Hospital Stay: Payer: BC Managed Care – PPO

## 2021-01-10 ENCOUNTER — Other Ambulatory Visit: Payer: Self-pay

## 2021-01-10 ENCOUNTER — Inpatient Hospital Stay (HOSPITAL_BASED_OUTPATIENT_CLINIC_OR_DEPARTMENT_OTHER): Payer: BC Managed Care – PPO | Admitting: Oncology

## 2021-01-10 ENCOUNTER — Encounter: Payer: Self-pay | Admitting: Oncology

## 2021-01-10 VITALS — BP 139/62 | HR 77 | Temp 79.8°F | Resp 18 | Wt 162.1 lb

## 2021-01-10 DIAGNOSIS — C221 Intrahepatic bile duct carcinoma: Secondary | ICD-10-CM

## 2021-01-10 DIAGNOSIS — Z95828 Presence of other vascular implants and grafts: Secondary | ICD-10-CM

## 2021-01-10 DIAGNOSIS — C220 Liver cell carcinoma: Secondary | ICD-10-CM | POA: Diagnosis not present

## 2021-01-10 LAB — CBC WITH DIFFERENTIAL/PLATELET
Abs Immature Granulocytes: 0.03 10*3/uL (ref 0.00–0.07)
Basophils Absolute: 0.1 10*3/uL (ref 0.0–0.1)
Basophils Relative: 1 %
Eosinophils Absolute: 0.2 10*3/uL (ref 0.0–0.5)
Eosinophils Relative: 2 %
HCT: 32.7 % — ABNORMAL LOW (ref 36.0–46.0)
Hemoglobin: 10.4 g/dL — ABNORMAL LOW (ref 12.0–15.0)
Immature Granulocytes: 0 %
Lymphocytes Relative: 17 %
Lymphs Abs: 1.6 10*3/uL (ref 0.7–4.0)
MCH: 29.4 pg (ref 26.0–34.0)
MCHC: 31.8 g/dL (ref 30.0–36.0)
MCV: 92.4 fL (ref 80.0–100.0)
Monocytes Absolute: 1 10*3/uL (ref 0.1–1.0)
Monocytes Relative: 10 %
Neutro Abs: 6.6 10*3/uL (ref 1.7–7.7)
Neutrophils Relative %: 70 %
Platelets: 376 10*3/uL (ref 150–400)
RBC: 3.54 MIL/uL — ABNORMAL LOW (ref 3.87–5.11)
RDW: 17.4 % — ABNORMAL HIGH (ref 11.5–15.5)
WBC: 9.4 10*3/uL (ref 4.0–10.5)
nRBC: 0 % (ref 0.0–0.2)

## 2021-01-10 LAB — COMPREHENSIVE METABOLIC PANEL
ALT: 53 U/L — ABNORMAL HIGH (ref 0–44)
AST: 67 U/L — ABNORMAL HIGH (ref 15–41)
Albumin: 3.4 g/dL — ABNORMAL LOW (ref 3.5–5.0)
Alkaline Phosphatase: 88 U/L (ref 38–126)
Anion gap: 9 (ref 5–15)
BUN: 17 mg/dL (ref 6–20)
CO2: 24 mmol/L (ref 22–32)
Calcium: 9 mg/dL (ref 8.9–10.3)
Chloride: 103 mmol/L (ref 98–111)
Creatinine, Ser: 0.75 mg/dL (ref 0.44–1.00)
GFR, Estimated: >60 mL/min (ref >60)
Glucose, Bld: 174 mg/dL — ABNORMAL HIGH (ref 70–99)
Potassium: 3.9 mmol/L (ref 3.5–5.1)
Sodium: 136 mmol/L (ref 135–145)
Total Bilirubin: 0.6 mg/dL (ref 0.3–1.2)
Total Protein: 7.6 g/dL (ref 6.5–8.1)

## 2021-01-10 LAB — MAGNESIUM: Magnesium: 1.9 mg/dL (ref 1.7–2.4)

## 2021-01-10 MED ORDER — POTASSIUM CHLORIDE IN NACL 20-0.9 MEQ/L-% IV SOLN
Freq: Once | INTRAVENOUS | Status: AC
  Filled 2021-01-10: qty 1000

## 2021-01-10 MED ORDER — LIDOCAINE-PRILOCAINE 2.5-2.5 % EX CREA: 1.0000 "application " | TOPICAL_CREAM | CUTANEOUS | 2 refills | Status: AC | PRN

## 2021-01-10 MED ORDER — MAGNESIUM SULFATE 2 GM/50ML IV SOLN
2.0000 g | Freq: Once | INTRAVENOUS | Status: AC
  Administered 2021-01-10: 2 g via INTRAVENOUS
  Filled 2021-01-10: qty 50

## 2021-01-10 MED ORDER — SODIUM CHLORIDE 0.9 % IV SOLN
Freq: Once | INTRAVENOUS | Status: AC
  Filled 2021-01-10: qty 250

## 2021-01-10 MED ORDER — SODIUM CHLORIDE 0.9 % IV SOLN
150.0000 mg | Freq: Once | INTRAVENOUS | Status: AC
  Administered 2021-01-10: 150 mg via INTRAVENOUS
  Filled 2021-01-10: qty 150

## 2021-01-10 MED ORDER — PALONOSETRON HCL INJECTION 0.25 MG/5ML
0.2500 mg | Freq: Once | INTRAVENOUS | Status: AC
  Administered 2021-01-10: 0.25 mg via INTRAVENOUS
  Filled 2021-01-10: qty 5

## 2021-01-10 MED ORDER — SODIUM CHLORIDE 0.9 % IV SOLN
80.0000 mg/m2 | Freq: Once | INTRAVENOUS | Status: AC
  Administered 2021-01-10: 148 mg via INTRAVENOUS
  Filled 2021-01-10: qty 100

## 2021-01-10 MED ORDER — SODIUM CHLORIDE 0.9% FLUSH
10.0000 mL | INTRAVENOUS | Status: DC | PRN
  Administered 2021-01-10: 10 mL
  Filled 2021-01-10: qty 10

## 2021-01-10 MED ORDER — HEPARIN SOD (PORK) LOCK FLUSH 100 UNIT/ML IV SOLN
500.0000 [IU] | Freq: Once | INTRAVENOUS | Status: AC
  Administered 2021-01-10: 500 [IU] via INTRAVENOUS
  Filled 2021-01-10: qty 5

## 2021-01-10 MED ORDER — SODIUM CHLORIDE 0.9 % IV SOLN
1800.0000 mg | Freq: Once | INTRAVENOUS | Status: AC
  Administered 2021-01-10: 1800 mg via INTRAVENOUS
  Filled 2021-01-10: qty 26.3

## 2021-01-10 MED ORDER — SODIUM CHLORIDE 0.9% FLUSH
10.0000 mL | Freq: Once | INTRAVENOUS | Status: AC
  Administered 2021-01-10: 10 mL via INTRAVENOUS
  Filled 2021-01-10: qty 10

## 2021-01-10 MED ORDER — HEPARIN SOD (PORK) LOCK FLUSH 100 UNIT/ML IV SOLN
500.0000 [IU] | Freq: Once | INTRAVENOUS | Status: DC | PRN
  Filled 2021-01-10: qty 5

## 2021-01-10 MED ORDER — SODIUM CHLORIDE 0.9 % IV SOLN
Freq: Once | INTRAVENOUS | Status: DC
  Filled 2021-01-10: qty 250

## 2021-01-10 MED ORDER — SODIUM CHLORIDE 0.9 % IV SOLN
10.0000 mg | Freq: Once | INTRAVENOUS | Status: AC
  Administered 2021-01-10: 10 mg via INTRAVENOUS
  Filled 2021-01-10: qty 10

## 2021-01-12 NOTE — Progress Notes (Signed)
Lauren Lindsey  [phone]) [phone] [phone]) [phone]  ID: [name] OB: [birth year]  MR#: [id number]  [id number]  Patient Care Team: [name], MD as PCP - General (Family Medicine) Patient, No Pcp Per (Inactive) (General Practice) [name], RN as Oncology Nurse Navigator [name], [name], MD as Consulting Physician (Oncology)   CHIEF COMPLAINT: Stage IV intrahepatic cholangiocarcinoma.  INTERVAL HISTORY: Patient returns to clinic today for further evaluation and consideration of cycle 6, day 8 of cisplatin and gemcitabine which is gemcitabine only today.  She had increased weakness and fatigue after her last infusion, but otherwise feels well.  She does not complain of pain today. She has no neurologic complaints.  She has no chest pain, shortness of breath, cough, or hemoptysis.  She denies any nausea, vomiting, constipation, or diarrhea.  She has no melena or hematochezia.  She has no urinary complaints.  Patient offers no further specific complaints today.  REVIEW OF SYSTEMS:   Review of Systems  Constitutional: Positive for malaise/fatigue. Negative for fever and weight loss.  HENT: Negative.  Negative for congestion and sinus pain.   Respiratory: Negative.  Negative for cough, hemoptysis and shortness of breath.   Cardiovascular: Negative.  Negative for chest pain and leg swelling.  Gastrointestinal: Negative.  Negative for abdominal pain, blood in stool, constipation, diarrhea, melena, nausea and vomiting.  Genitourinary: Negative.  Negative for dysuria and flank pain.  Musculoskeletal: Negative.  Negative for back pain.  Skin: Negative.  Negative for rash.  Neurological: Positive for weakness. Negative for dizziness, focal weakness and headaches.  Psychiatric/Behavioral: Negative.  The patient is not nervous/anxious.     As per HPI. Otherwise, a complete review of systems is negative.  PAST MEDICAL HISTORY: Past Medical  History:  Diagnosis Date  . Abnormal Pap smear of cervix   . Anemia    h/o  . Diabetes mellitus without complication ([place])   . Intrahepatic cholangiocarcinoma ([place])     PAST SURGICAL HISTORY: Past Surgical History:  Procedure Laterality Date  . CHOLECYSTECTOMY    . COLONOSCOPY WITH PROPOFOL N/A 06/15/2020   Procedure: COLONOSCOPY WITH PROPOFOL;  Surgeon: Lin Landsman, MD;  Location: [facility];  Service: Endoscopy;  Laterality: N/A;  Diabetic - oral meds  . ENDOMETRIAL ABLATION    . HYSTEROSCOPY WITH D & C    . LEEP    . PORTA CATH INSERTION N/A 01/04/2021   Procedure: PORTA CATH INSERTION;  Surgeon: Algernon Huxley, MD;  Location: [facility];  Service: Cardiovascular;  Laterality: N/A;    FAMILY HISTORY: Family History  Problem Relation Age of Onset  . Bleeding Disorder Mother   . Biliary Cirrhosis Mother   . Liver cancer Mother   . Hypertension Mother   . Diabetes Father   . Lung cancer Father   . Breast cancer Neg Hx     ADVANCED DIRECTIVES (Y/N):  N  HEALTH MAINTENANCE: Social History   Tobacco Use  . Smoking status: Never Smoker  . Smokeless tobacco: Never Used  Vaping Use  . Vaping Use: Never used  Substance Use Topics  . Alcohol use: No  . Drug use: No     Colonoscopy:  PAP:  Bone density:  Lipid panel:  No Known Allergies  Current Outpatient Medications  Medication Sig Dispense Refill  . acetaminophen (TYLENOL) 500 MG tablet Take 500 mg by mouth every 6 (six) hours as needed.    Marland Kitchen levofloxacin (LEVAQUIN) 500 MG tablet Take  1 tablet (500 mg total) by mouth daily. 5 tablet 0  . lidocaine-prilocaine (EMLA) cream Apply 1 application topically as needed. 30 g 2  . metFORMIN (GLUCOPHAGE) 500 MG tablet Take 500 mg by mouth 2 (two) times daily.    . ondansetron (ZOFRAN) 8 MG tablet Take 1 tablet (8 mg total) by mouth every 8 (eight) hours as needed for nausea or vomiting. 30 tablet 2   No current facility-administered medications  for this visit.   Facility-Administered Medications Ordered in Other Visits  Medication Dose Route Frequency Provider Last Rate Last Admin  . heparin lock flush 100 unit/mL  500 Units Intravenous Once Lloyd Huger, MD      . sodium chloride flush (NS) 0.9 % injection 10 mL  10 mL Intravenous PRN Lloyd Huger, MD   10 mL at 01/17/21 0854    OBJECTIVE: Vitals:   01/17/21 0902  BP: 135/75  Pulse: 66  Resp: 18  Temp: 97.7 F (36.5 C)  SpO2: 100%     Body mass index is 26.63 kg/m.    ECOG FS:0 - Asymptomatic   General: Well-developed, well-nourished, no acute distress. Eyes: Pink conjunctiva, anicteric sclera. HEENT: Normocephalic, moist mucous membranes. Lungs: No audible wheezing or coughing. Heart: Regular rate and rhythm. Abdomen: Soft, nontender, no obvious distention. Musculoskeletal: No edema, cyanosis, or clubbing. Neuro: Alert, answering all questions appropriately. Cranial nerves grossly intact. Skin: No rashes or petechiae noted. Psych: Normal affect.   LAB RESULTS:  Lab Results  Component Value Date   NA 135 01/17/2021   K 3.7 01/17/2021   CL 101 01/17/2021   CO2 25 01/17/2021   GLUCOSE 157 (H) 01/17/2021   BUN 16 01/17/2021   CREATININE 0.85 01/17/2021   CALCIUM 8.7 (L) 01/17/2021   PROT 7.5 01/17/2021   ALBUMIN 3.5 01/17/2021   AST 63 (H) 01/17/2021   ALT 69 (H) 01/17/2021   ALKPHOS 85 01/17/2021   BILITOT 0.3 01/17/2021   GFRNONAA >60 01/17/2021   GFRAA >60 05/23/2020    Lab Results  Component Value Date   WBC 4.0 01/17/2021   NEUTROABS 1.8 01/17/2021   HGB 10.3 (L) 01/17/2021   HCT 31.9 (L) 01/17/2021   MCV 90.9 01/17/2021   PLT 192 01/17/2021     STUDIES: PERIPHERAL VASCULAR CATHETERIZATION  Result Date: 01/04/2021 See op note   ASSESSMENT: Stage IV intrahepatic cholangiocarcinoma.  PLAN:    1.  Stage IV intrahepatic cholangiocarcinoma: Incidental finding on routine cholecystectomy. PET scan results from July 13, 2020 as well as MRI results from July 20, 2020 reviewed independently with right lobe liver lesion consistent with intrahepatic cholangiocarcinoma.  Biopsy confirmed the results.  Gynecologic work-up is essentially negative.  Colonoscopy on June 15, 2020 was essentially within normal limits.  Mammogram on July 19, 2020 was reported as BI-RADS 2. All patient tumor markers are negative.  Patient has now had port placement secondary to poor IV access.  Plan to give cisplatin and gemcitabine on day 1 with gemcitabine only on day 8.  This will be a 21-day cycle.  Because of a history of neutropenia, patient will require Ziextenzo after day 8 of treatment.  Repeat MRI on December 01, 2020 reviewed independently with overall improvement of disease burden, but likely residual malignancy.  Proceed with 4 additional cycles and then reimage in June 2022.  Proceed with cycle 6, day 8 of treatment today.  Return to clinic tomorrow for Ziextenzo and then in 2 weeks for further evaluation and consideration  of cycle 7, day 1.   2.  Neutropenia: Recurrent.  Continue Ziextenzo after day 8 of each cycle. 3.  Anemia: Chronic and unchanged.  Patient's hemoglobin is 10.3 today. 4.   Flank pain: Patient does not complain of this today.  Likely secondary to malignancy.  Abdominal ultrasound was unrevealing.   5.  Elevated liver enzymes: Chronic and unchanged. 6.  Hyperglycemia: Patient has improved blood glucose control.  Patient expressed understanding and was in agreement with this plan. She also understands that She can call clinic at any time with any questions, concerns, or complaints.    Cancer Staging Intrahepatic cholangiocarcinoma ([place]) Staging form: Intrahepatic Bile Duct, AJCC 8th Edition - Clinical stage from 08/04/2020: Stage IV (cT1b, cN0, pM1) - Signed by Lloyd Huger, MD on 08/04/2020   Lloyd Huger, MD   01/18/2021 6:18 AM

## 2021-01-16 ENCOUNTER — Other Ambulatory Visit: Payer: Self-pay

## 2021-01-16 ENCOUNTER — Other Ambulatory Visit: Payer: Self-pay | Admitting: Oncology

## 2021-01-17 ENCOUNTER — Encounter: Payer: Self-pay | Admitting: Oncology

## 2021-01-17 ENCOUNTER — Other Ambulatory Visit: Payer: Self-pay

## 2021-01-17 ENCOUNTER — Inpatient Hospital Stay (HOSPITAL_BASED_OUTPATIENT_CLINIC_OR_DEPARTMENT_OTHER): Payer: BC Managed Care – PPO | Admitting: Oncology

## 2021-01-17 ENCOUNTER — Inpatient Hospital Stay: Payer: BC Managed Care – PPO

## 2021-01-17 VITALS — BP 158/88 | HR 59 | Resp 16

## 2021-01-17 VITALS — BP 135/75 | HR 66 | Temp 97.7°F | Resp 18 | Ht 65.0 in | Wt 160.0 lb

## 2021-01-17 DIAGNOSIS — C221 Intrahepatic bile duct carcinoma: Secondary | ICD-10-CM

## 2021-01-17 DIAGNOSIS — C220 Liver cell carcinoma: Secondary | ICD-10-CM | POA: Diagnosis not present

## 2021-01-17 DIAGNOSIS — Z95828 Presence of other vascular implants and grafts: Secondary | ICD-10-CM

## 2021-01-17 LAB — COMPREHENSIVE METABOLIC PANEL
ALT: 69 U/L — ABNORMAL HIGH (ref 0–44)
AST: 63 U/L — ABNORMAL HIGH (ref 15–41)
Albumin: 3.5 g/dL (ref 3.5–5.0)
Alkaline Phosphatase: 85 U/L (ref 38–126)
Anion gap: 9 (ref 5–15)
BUN: 16 mg/dL (ref 6–20)
CO2: 25 mmol/L (ref 22–32)
Calcium: 8.7 mg/dL — ABNORMAL LOW (ref 8.9–10.3)
Chloride: 101 mmol/L (ref 98–111)
Creatinine, Ser: 0.85 mg/dL (ref 0.44–1.00)
GFR, Estimated: >60 mL/min (ref >60)
Glucose, Bld: 157 mg/dL — ABNORMAL HIGH (ref 70–99)
Potassium: 3.7 mmol/L (ref 3.5–5.1)
Sodium: 135 mmol/L (ref 135–145)
Total Bilirubin: 0.3 mg/dL (ref 0.3–1.2)
Total Protein: 7.5 g/dL (ref 6.5–8.1)

## 2021-01-17 LAB — CBC WITH DIFFERENTIAL/PLATELET
Abs Immature Granulocytes: 0.01 10*3/uL (ref 0.00–0.07)
Basophils Absolute: 0 10*3/uL (ref 0.0–0.1)
Basophils Relative: 1 %
Eosinophils Absolute: 0.1 10*3/uL (ref 0.0–0.5)
Eosinophils Relative: 2 %
HCT: 31.9 % — ABNORMAL LOW (ref 36.0–46.0)
Hemoglobin: 10.3 g/dL — ABNORMAL LOW (ref 12.0–15.0)
Immature Granulocytes: 0 %
Lymphocytes Relative: 40 %
Lymphs Abs: 1.6 10*3/uL (ref 0.7–4.0)
MCH: 29.3 pg (ref 26.0–34.0)
MCHC: 32.3 g/dL (ref 30.0–36.0)
MCV: 90.9 fL (ref 80.0–100.0)
Monocytes Absolute: 0.5 10*3/uL (ref 0.1–1.0)
Monocytes Relative: 12 %
Neutro Abs: 1.8 10*3/uL (ref 1.7–7.7)
Neutrophils Relative %: 45 %
Platelets: 192 10*3/uL (ref 150–400)
RBC: 3.51 MIL/uL — ABNORMAL LOW (ref 3.87–5.11)
RDW: 15.9 % — ABNORMAL HIGH (ref 11.5–15.5)
WBC: 4 10*3/uL (ref 4.0–10.5)
nRBC: 0 % (ref 0.0–0.2)

## 2021-01-17 MED ORDER — HEPARIN SOD (PORK) LOCK FLUSH 100 UNIT/ML IV SOLN
500.0000 [IU] | Freq: Once | INTRAVENOUS | Status: AC
  Filled 2021-01-17: qty 5

## 2021-01-17 MED ORDER — HEPARIN SOD (PORK) LOCK FLUSH 100 UNIT/ML IV SOLN
INTRAVENOUS | Status: AC
  Filled 2021-01-17: qty 5

## 2021-01-17 MED ORDER — PROCHLORPERAZINE MALEATE 10 MG PO TABS
10.0000 mg | ORAL_TABLET | Freq: Once | ORAL | Status: AC
  Administered 2021-01-17: 10 mg via ORAL
  Filled 2021-01-17: qty 1

## 2021-01-17 MED ORDER — SODIUM CHLORIDE 0.9 % IV SOLN
1800.0000 mg | Freq: Once | INTRAVENOUS | Status: AC
  Administered 2021-01-17: 1800 mg via INTRAVENOUS
  Filled 2021-01-17: qty 26.3

## 2021-01-17 MED ORDER — HEPARIN SOD (PORK) LOCK FLUSH 100 UNIT/ML IV SOLN
500.0000 [IU] | Freq: Once | INTRAVENOUS | Status: AC | PRN
  Administered 2021-01-17: 500 [IU]
  Filled 2021-01-17: qty 5

## 2021-01-17 MED ORDER — SODIUM CHLORIDE 0.9% FLUSH
10.0000 mL | INTRAVENOUS | Status: AC | PRN
  Administered 2021-01-17: 10 mL via INTRAVENOUS
  Filled 2021-01-17: qty 10

## 2021-01-17 MED ORDER — SODIUM CHLORIDE 0.9 % IV SOLN
Freq: Once | INTRAVENOUS | Status: AC
  Filled 2021-01-17: qty 250

## 2021-01-17 NOTE — Patient Instructions (Signed)
[facility] ONCOL[facility]  Discharge Instructions: Thank you for choosing [facility] to provide your oncology and hematology care.  If you have a lab appointment with the [facility], please go directly to the [facility] and check in at the registration area.  Wear comfortable clothing and clothing appropriate for easy access to any Portacath or PICC line.   We strive to give you quality time with your provider. You may need to reschedule your appointment if you arrive late (15 or more minutes).  Arriving late affects you and other patients whose appointments are after yours.  Also, if you miss three or more appointments without notifying the office, you may be dismissed from the clinic at the provider's discretion.      For prescription refill requests, have your pharmacy contact our office and allow 72 hours for refills to be completed.    Today you received the following chemotherapy and/or immunotherapy agents GemzarGemcitabine injection What is this medicine? GEMCITABINE (jem SYE ta been) is a chemotherapy drug. This medicine is used to treat many types of cancer like breast cancer, lung cancer, pancreatic cancer, and ovarian cancer. This medicine may be used for other purposes; ask your health care provider or pharmacist if you have questions. COMMON BRAND NAME(S): Gemzar, Infugem What should I tell my health care provider before I take this medicine? They need to know if you have any of these conditions:  blood disorders  infection  kidney disease  liver disease  lung or breathing disease, like asthma  recent or ongoing radiation therapy  an unusual or allergic reaction to gemcitabine, other chemotherapy, other medicines, foods, dyes, or preservatives  pregnant or trying to get pregnant  breast-feeding How should I use this medicine? This drug is given as an infusion into a vein. It is administered in a hospital or clinic by a  specially trained health care professional. Talk to your pediatrician regarding the use of this medicine in children. Special care may be needed. Overdosage: If you think you have taken too much of this medicine contact a poison control center or emergency room at once. NOTE: This medicine is only for you. Do not share this medicine with others. What if I miss a dose? It is important not to miss your dose. Call your doctor or health care professional if you are unable to keep an appointment. What may interact with this medicine?  medicines to increase blood counts like filgrastim, pegfilgrastim, sargramostim  some other chemotherapy drugs like cisplatin  vaccines Talk to your doctor or health care professional before taking any of these medicines:  acetaminophen  aspirin  ibuprofen  ketoprofen  naproxen This list may not describe all possible interactions. Give your health care provider a list of all the medicines, herbs, non-prescription drugs, or dietary supplements you use. Also tell them if you smoke, drink alcohol, or use illegal drugs. Some items may interact with your medicine. What should I watch for while using this medicine? Visit your doctor for checks on your progress. This drug may make you feel generally unwell. This is not uncommon, as chemotherapy can affect healthy cells as well as cancer cells. Report any side effects. Continue your course of treatment even though you feel ill unless your doctor tells you to stop. In some cases, you may be given additional medicines to help with side effects. Follow all directions for their use. Call your doctor or health care professional for advice if you get a  fever, chills or sore throat, or other symptoms of a cold or flu. Do not treat yourself. This drug decreases your body's ability to fight infections. Try to avoid being around people who are sick. This medicine may increase your risk to bruise or bleed. Call your doctor or  health care professional if you notice any unusual bleeding. Be careful brushing and flossing your teeth or using a toothpick because you may get an infection or bleed more easily. If you have any dental work done, tell your dentist you are receiving this medicine. Avoid taking products that contain aspirin, acetaminophen, ibuprofen, naproxen, or ketoprofen unless instructed by your doctor. These medicines may hide a fever. Do not become pregnant while taking this medicine or for 6 months after stopping it. Women should inform their doctor if they wish to become pregnant or think they might be pregnant. Men should not father a child while taking this medicine and for 3 months after stopping it. There is a potential for serious side effects to an unborn child. Talk to your health care professional or pharmacist for more information. Do not breast-feed an infant while taking this medicine or for at least 1 week after stopping it. Men should inform their doctors if they wish to father a child. This medicine may lower sperm counts. Talk with your doctor or health care professional if you are concerned about your fertility. What side effects may I notice from receiving this medicine? Side effects that you should report to your doctor or health care professional as soon as possible:  allergic reactions like skin rash, itching or hives, swelling of the face, lips, or tongue  breathing problems  pain, redness, or irritation at site where injected  signs and symptoms of a dangerous change in heartbeat or heart rhythm like chest pain; dizziness; fast or irregular heartbeat; palpitations; feeling faint or lightheaded, falls; breathing problems  signs of decreased platelets or bleeding - bruising, pinpoint red spots on the skin, black, tarry stools, blood in the urine  signs of decreased red blood cells - unusually weak or tired, feeling faint or lightheaded, falls  signs of infection - fever or chills,  cough, sore throat, pain or difficulty passing urine  signs and symptoms of kidney injury like trouble passing urine or change in the amount of urine  signs and symptoms of liver injury like dark yellow or brown urine; general ill feeling or flu-like symptoms; light-colored stools; loss of appetite; nausea; right upper belly pain; unusually weak or tired; yellowing of the eyes or skin  swelling of ankles, feet, hands Side effects that usually do not require medical attention (report to your doctor or health care professional if they continue or are bothersome):  constipation  diarrhea  hair loss  loss of appetite  nausea  rash  vomiting This list may not describe all possible side effects. Call your doctor for medical advice about side effects. You may report side effects to FDA at 1-800-FDA-1088. Where should I keep my medicine? This drug is given in a hospital or clinic and will not be stored at home. NOTE: This sheet is a summary. It may not cover all possible information. If you have questions about this medicine, talk to your doctor, pharmacist, or health care provider.  2021 Elsevier/Gold Standard (2017-12-03 18:06:11)       To help prevent nausea and vomiting after your treatment, we encourage you to take your nausea medication as directed.  BELOW ARE SYMPTOMS THAT SHOULD BE REPORTED  IMMEDIATELY: . *FEVER GREATER THAN 100.4 F (38 C) OR HIGHER . *CHILLS OR SWEATING . *NAUSEA AND VOMITING THAT IS NOT CONTROLLED WITH YOUR NAUSEA MEDICATION . *UNUSUAL SHORTNESS OF BREATH . *UNUSUAL BRUISING OR BLEEDING . *URINARY PROBLEMS (pain or burning when urinating, or frequent urination) . *BOWEL PROBLEMS (unusual diarrhea, constipation, pain near the anus) . TENDERNESS IN MOUTH AND THROAT WITH OR WITHOUT PRESENCE OF ULCERS (sore throat, sores in mouth, or a toothache) . UNUSUAL RASH, SWELLING OR PAIN  . UNUSUAL VAGINAL DISCHARGE OR ITCHING   Items with * indicate a potential  emergency and should be followed up as soon as possible or go to the Emergency Department if any problems should occur.  Please show the CHEMOTHERAPY ALERT CARD or IMMUNOTHERAPY ALERT CARD at check-in to the Emergency Department and triage nurse.  Should you have questions after your visit or need to cancel or reschedule your appointment, please contact [facility]  [phone] and follow the prompts.  Office hours are 8:00 a.m. to 4:30 p.m. Monday - Friday. Please note that voicemails left after 4:00 p.m. may not be returned until the following business day.  We are closed weekends and major holidays. You have access to a nurse at all times for urgent questions. Please call the main number to the clinic [phone] and follow the prompts.  For any non-urgent questions, you may also contact your provider using MyChart. We now offer e-Visits for anyone 51 and older to request care online for non-urgent symptoms. For details visit [email or web address].   Also download the MyChart app! Go to the app store, search "MyChart", open the app, select [facility], and log in with your MyChart username and password.  Due to Covid, a mask is required upon entering the hospital/clinic. If you do not have a mask, one will be given to you upon arrival. For doctor visits, patients may have 1 support person aged 52 or older with them. For treatment visits, patients cannot have anyone with them due to current Covid guidelines and our immunocompromised population.

## 2021-01-17 NOTE — Progress Notes (Signed)
Pt sts she is having a lot of nausea only. Not taken and rx for the nausea . Constipation for two days.

## 2021-01-18 ENCOUNTER — Inpatient Hospital Stay: Payer: BC Managed Care – PPO

## 2021-01-18 ENCOUNTER — Other Ambulatory Visit: Payer: Self-pay

## 2021-01-18 DIAGNOSIS — C220 Liver cell carcinoma: Secondary | ICD-10-CM | POA: Diagnosis not present

## 2021-01-18 DIAGNOSIS — C221 Intrahepatic bile duct carcinoma: Secondary | ICD-10-CM

## 2021-01-18 MED ORDER — PEGFILGRASTIM-BMEZ 6 MG/0.6ML ~~LOC~~ SOSY
6.0000 mg | PREFILLED_SYRINGE | Freq: Once | SUBCUTANEOUS | Status: AC
  Administered 2021-01-18: 6 mg via SUBCUTANEOUS
  Filled 2021-01-18: qty 0.6

## 2021-01-22 ENCOUNTER — Telehealth: Payer: Self-pay | Admitting: *Deleted

## 2021-01-22 ENCOUNTER — Inpatient Hospital Stay: Payer: BC Managed Care – PPO | Attending: Hospice and Palliative Medicine | Admitting: Hospice and Palliative Medicine

## 2021-01-22 ENCOUNTER — Other Ambulatory Visit: Payer: Self-pay

## 2021-01-22 ENCOUNTER — Inpatient Hospital Stay: Payer: BC Managed Care – PPO

## 2021-01-22 VITALS — BP 161/70 | HR 96 | Temp 100.6°F | Resp 18

## 2021-01-22 DIAGNOSIS — C221 Intrahepatic bile duct carcinoma: Secondary | ICD-10-CM | POA: Insufficient documentation

## 2021-01-22 DIAGNOSIS — N2889 Other specified disorders of kidney and ureter: Secondary | ICD-10-CM | POA: Diagnosis not present

## 2021-01-22 DIAGNOSIS — E119 Type 2 diabetes mellitus without complications: Secondary | ICD-10-CM | POA: Diagnosis not present

## 2021-01-22 DIAGNOSIS — Z7984 Long term (current) use of oral hypoglycemic drugs: Secondary | ICD-10-CM | POA: Diagnosis not present

## 2021-01-22 DIAGNOSIS — R948 Abnormal results of function studies of other organs and systems: Secondary | ICD-10-CM | POA: Insufficient documentation

## 2021-01-22 DIAGNOSIS — D649 Anemia, unspecified: Secondary | ICD-10-CM | POA: Diagnosis not present

## 2021-01-22 DIAGNOSIS — L0291 Cutaneous abscess, unspecified: Secondary | ICD-10-CM

## 2021-01-22 DIAGNOSIS — Z5189 Encounter for other specified aftercare: Secondary | ICD-10-CM | POA: Insufficient documentation

## 2021-01-22 DIAGNOSIS — Z95828 Presence of other vascular implants and grafts: Secondary | ICD-10-CM

## 2021-01-22 DIAGNOSIS — M7989 Other specified soft tissue disorders: Secondary | ICD-10-CM | POA: Insufficient documentation

## 2021-01-22 DIAGNOSIS — Z5111 Encounter for antineoplastic chemotherapy: Secondary | ICD-10-CM | POA: Insufficient documentation

## 2021-01-22 DIAGNOSIS — E871 Hypo-osmolality and hyponatremia: Secondary | ICD-10-CM | POA: Diagnosis not present

## 2021-01-22 DIAGNOSIS — Z79899 Other long term (current) drug therapy: Secondary | ICD-10-CM | POA: Insufficient documentation

## 2021-01-22 DIAGNOSIS — D709 Neutropenia, unspecified: Secondary | ICD-10-CM | POA: Diagnosis not present

## 2021-01-22 LAB — CBC WITH DIFFERENTIAL/PLATELET
Abs Immature Granulocytes: 0.2 10*3/uL — ABNORMAL HIGH (ref 0.00–0.07)
Band Neutrophils: 8 %
Basophils Absolute: 0 10*3/uL (ref 0.0–0.1)
Basophils Relative: 0 %
Eosinophils Absolute: 0.2 10*3/uL (ref 0.0–0.5)
Eosinophils Relative: 1 %
HCT: 29.3 % — ABNORMAL LOW (ref 36.0–46.0)
Hemoglobin: 9.6 g/dL — ABNORMAL LOW (ref 12.0–15.0)
Lymphocytes Relative: 23 %
Lymphs Abs: 3.5 10*3/uL (ref 0.7–4.0)
MCH: 29.6 pg (ref 26.0–34.0)
MCHC: 32.8 g/dL (ref 30.0–36.0)
MCV: 90.4 fL (ref 80.0–100.0)
Metamyelocytes Relative: 1 %
Monocytes Absolute: 0.6 10*3/uL (ref 0.1–1.0)
Monocytes Relative: 4 %
Neutro Abs: 10.9 10*3/uL — ABNORMAL HIGH (ref 1.7–7.7)
Neutrophils Relative %: 63 %
Platelets: 88 10*3/uL — ABNORMAL LOW (ref 150–400)
RBC: 3.24 MIL/uL — ABNORMAL LOW (ref 3.87–5.11)
RDW: 15.8 % — ABNORMAL HIGH (ref 11.5–15.5)
Smear Review: DECREASED
WBC: 15.3 10*3/uL — ABNORMAL HIGH (ref 4.0–10.5)
nRBC: 0 % (ref 0.0–0.2)

## 2021-01-22 LAB — COMPREHENSIVE METABOLIC PANEL
ALT: 36 U/L (ref 0–44)
AST: 32 U/L (ref 15–41)
Albumin: 3.6 g/dL (ref 3.5–5.0)
Alkaline Phosphatase: 147 U/L — ABNORMAL HIGH (ref 38–126)
Anion gap: 12 (ref 5–15)
BUN: 14 mg/dL (ref 6–20)
CO2: 23 mmol/L (ref 22–32)
Calcium: 8.7 mg/dL — ABNORMAL LOW (ref 8.9–10.3)
Chloride: 102 mmol/L (ref 98–111)
Creatinine, Ser: 0.91 mg/dL (ref 0.44–1.00)
GFR, Estimated: >60 mL/min (ref >60)
Glucose, Bld: 157 mg/dL — ABNORMAL HIGH (ref 70–99)
Potassium: 3.5 mmol/L (ref 3.5–5.1)
Sodium: 137 mmol/L (ref 135–145)
Total Bilirubin: 0.3 mg/dL (ref 0.3–1.2)
Total Protein: 7.7 g/dL (ref 6.5–8.1)

## 2021-01-22 MED ORDER — HEPARIN SOD (PORK) LOCK FLUSH 100 UNIT/ML IV SOLN
500.0000 [IU] | Freq: Once | INTRAVENOUS | Status: AC
Start: 2021-01-22 — End: 2021-01-22
  Administered 2021-01-22: 500 [IU]
  Filled 2021-01-22: qty 5

## 2021-01-22 MED ORDER — SULFAMETHOXAZOLE-TRIMETHOPRIM 800-160 MG PO TABS: 1.0000 | ORAL_TABLET | Freq: Two times a day (BID) | ORAL | 0 refills | Status: DC

## 2021-01-22 MED ORDER — SODIUM CHLORIDE 0.9% FLUSH
10.0000 mL | Freq: Once | INTRAVENOUS | Status: AC
Start: 2021-01-22 — End: 2021-01-22
  Administered 2021-01-22: 10 mL via INTRAVENOUS
  Filled 2021-01-22: qty 10

## 2021-01-22 NOTE — Telephone Encounter (Signed)
Mortimer Fries called back and said it is from infusions not injections

## 2021-01-22 NOTE — Progress Notes (Signed)
Symptom Management [facility]  [phone] [phone]  Patient Care Team: [name], MD as PCP - General (Family Medicine) Patient, No Pcp Per (Inactive) (General Practice) [name], RN as Oncology Nurse Navigator Lloyd Huger, MD as Consulting Physician (Oncology)   Name of the patient: Lauren Lindsey  [id number]  10/13/1960   Date of visit: 01/22/21  Reason for Consult:  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 She had a port placed 01/04/2021 to the right internal jugular vein. Patient last saw Dr. [name] on 01/10/2021 at which time she received cycle 6-day 1 cisplatin/gemcitabine. She was doing reasonably well at that time.   Patient presents to [facility] today for evaluation of right upper extremity swelling following her chemotherapy on 01/17/2021.  She reports 48 hours of redness and pain to her right forearm.  She says that similar location to where blood was drawn last week.  Denies any neurologic complaints. Denies recent fevers or illnesses. Denies any easy bleeding or bruising. Reports good appetite and denies weight loss. Denies chest pain. Denies any nausea, vomiting, constipation, or diarrhea. Denies urinary complaints. Patient offers no further specific complaints today.  PAST MEDICAL HISTORY: Past Medical History:  Diagnosis Date  . Abnormal Pap smear of cervix   . Anemia    h/o  . Diabetes mellitus without complication ([place])   . Intrahepatic cholangiocarcinoma ([place])     PAST SURGICAL HISTORY:  Past Surgical History:  Procedure Laterality Date  . CHOLECYSTECTOMY    . COLONOSCOPY WITH PROPOFOL N/A 06/15/2020   Procedure: COLONOSCOPY WITH PROPOFOL;  Surgeon: Lin Landsman, MD;  Location: [facility];  Service: Endoscopy;  Laterality: N/A;  Diabetic - oral meds  . ENDOMETRIAL ABLATION     . HYSTEROSCOPY WITH D & C    . LEEP    . PORTA CATH INSERTION N/A 01/04/2021   Procedure: PORTA CATH INSERTION;  Surgeon: Algernon Huxley, MD;  Location: [facility];  Service: Cardiovascular;  Laterality: N/A;    HEMATOLOGY/ONCOLOGY HISTORY:  Oncology History  Intrahepatic cholangiocarcinoma ([place])  05/31/2020 Initial Diagnosis   Intrahepatic cholangiocarcinoma ([place])   08/04/2020 Cancer Staging   Staging form: Intrahepatic Bile Duct, AJCC 8th Edition - Clinical stage from 08/04/2020: Stage IV (cT1b, cN0, pM1) - Signed by Lloyd Huger, MD on 08/04/2020   08/31/2020 -  Chemotherapy    Patient is on Treatment Plan: CISPLATIN D1 + GEMCITABINE D1,8 Q21D        ALLERGIES:  has No Known Allergies.  MEDICATIONS:  Current Outpatient Medications  Medication Sig Dispense Refill  . acetaminophen (TYLENOL) 500 MG tablet Take 500 mg by mouth every 6 (six) hours as needed.    Marland Kitchen levofloxacin (LEVAQUIN) 500 MG tablet Take 1 tablet (500 mg total) by mouth daily. 5 tablet 0  . lidocaine-prilocaine (EMLA) cream Apply 1 application topically as needed. 30 g 2  . metFORMIN (GLUCOPHAGE) 500 MG tablet Take 500 mg by mouth 2 (two) times daily.    . ondansetron (ZOFRAN) 8 MG tablet Take 1 tablet (8 mg total) by mouth every 8 (eight) hours as needed for nausea or vomiting. 30 tablet 2   No current facility-administered medications for this visit.   Facility-Administered Medications Ordered in Other Visits  Medication Dose Route Frequency Provider Last Rate Last Admin  . heparin lock flush 100 unit/mL  500 Units Intravenous Once Lloyd Huger,  MD      . sodium chloride flush (NS) 0.9 % injection 10 mL  10 mL Intravenous PRN Lloyd Huger, MD   10 mL at 01/17/21 0854    VITAL SIGNS: LMP 06/26/2019  There were no vitals filed for this visit.  Estimated body mass index is 26.63 kg/m as calculated from the following:   Height as of 01/17/21: 5\' 5"  (1.651 m).   Weight as of  01/17/21: 160 lb (72.6 kg).  LABS: CBC:    Component Value Date/Time   WBC 4.0 01/17/2021 0848   HGB 10.3 (L) 01/17/2021 0848   HGB 12.4 01/31/2012 0959   HCT 31.9 (L) 01/17/2021 0848   HCT 37.9 01/31/2012 0959   PLT 192 01/17/2021 0848   PLT 330 01/31/2012 0959   MCV 90.9 01/17/2021 0848   MCV 82 01/31/2012 0959   NEUTROABS 1.8 01/17/2021 0848   LYMPHSABS 1.6 01/17/2021 0848   MONOABS 0.5 01/17/2021 0848   EOSABS 0.1 01/17/2021 0848   BASOSABS 0.0 01/17/2021 0848   Comprehensive Metabolic Panel:    Component Value Date/Time   NA 135 01/17/2021 0848   K 3.7 01/17/2021 0848   CL 101 01/17/2021 0848   CO2 25 01/17/2021 0848   BUN 16 01/17/2021 0848   CREATININE 0.85 01/17/2021 0848   GLUCOSE 157 (H) 01/17/2021 0848   CALCIUM 8.7 (L) 01/17/2021 0848   AST 63 (H) 01/17/2021 0848   ALT 69 (H) 01/17/2021 0848   ALKPHOS 85 01/17/2021 0848   BILITOT 0.3 01/17/2021 0848   PROT 7.5 01/17/2021 0848   ALBUMIN 3.5 01/17/2021 0848    RADIOGRAPHIC STUDIES: PERIPHERAL VASCULAR CATHETERIZATION  Result Date: 01/04/2021 See op note   PERFORMANCE STATUS (ECOG) : 0 - Asymptomatic  Review of Systems Unless otherwise noted, a complete review of systems is negative.  Physical Exam General: NAD Cardiovascular: regular rate and rhythm Pulmonary: clear ant fields Abdomen: soft, nontender, + bowel sounds GU: no suprapubic tenderness Extremities: no edema, no joint deformities Skin: red, indurated area to R. Forearm without drainage. No streaking. No swelling or erythema to port or surrounding area. Neurological: Grossly nonfocal      Assessment and Plan- Patient is a 60 y.o. 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  Forearm swelling - patient is concerned about DVT and would like workup. Will send for UE doppler but my suspicion for DVT is extremely low.   Clinically this appears to be abscess/cellulitis.  Will start on Bactrim DS BID x 10 days. Patient had low grade fever in clinic today (100.6). WBC downtrending recently given chemotherapy. Will send patient for CBC to ensure she is not neutropenic.   RTC on 5/11 as previously scheduled or sooner if needed  Patient expressed understanding and was in agreement with this plan. She also understands that She can call clinic at any time with any questions, concerns, or complaints.   Thank you for allowing me to participate in the care of this very pleasant patient.   Time Total: 25 minutes  Visit consisted of counseling and education dealing with the complex and emotionally intense issues of symptom management and palliative care in the setting of serious and potentially life-threatening illness.Greater than 50%  of this time was spent counseling and coordinating care related to the above assessment and plan.  Signed by: Altha Harm, PhD, NP-C

## 2021-01-22 NOTE — Telephone Encounter (Signed)
Contacted patient's husband and scheduled patient to be seen in symptom management clinic at 2:15pm today. He verbalized understanding.

## 2021-01-22 NOTE — Telephone Encounter (Signed)
Husband Mortimer Fries called reporting that patient has swelling in her arm where she was getting all her injections and would like to be seen about it.

## 2021-01-22 NOTE — Progress Notes (Signed)
Pt with swollen, reddened area to posterior right forearm. Area is warm to touch. States that she first noticed it over the weekend. Does not recall any injury or insect bites. Stated that when she was in the hospital, she was stuck numerous times for IV access, and feels this could be related. Fever noted 100.6.

## 2021-01-23 ENCOUNTER — Other Ambulatory Visit: Payer: Self-pay

## 2021-01-23 ENCOUNTER — Ambulatory Visit
Admission: RE | Admit: 2021-01-23 | Discharge: 2021-01-23 | Disposition: A | Discharge status: Home / Self Care | Payer: BC Managed Care – PPO | Source: Ambulatory Visit | Attending: Hospice and Palliative Medicine | Admitting: Hospice and Palliative Medicine

## 2021-01-23 DIAGNOSIS — C221 Intrahepatic bile duct carcinoma: Secondary | ICD-10-CM | POA: Diagnosis present

## 2021-01-26 NOTE — Progress Notes (Signed)
[place]  [phone] [phone]  ID: Lauren Lindsey OB: May 16, 1961  MR#: [id number]  SAY#:[id number]  Patient Care Team: [name], MD as PCP - General (Family Medicine) Patient, No Pcp Per (Inactive) (General Practice) [name], RN as Oncology Nurse Navigator [name], [name], MD as Consulting Physician (Oncology)   CHIEF COMPLAINT: Stage IV intrahepatic cholangiocarcinoma.  INTERVAL HISTORY: Patient returns to clinic today for further evaluation and consideration of cycle 7, day 1 of cisplatin and gemcitabine.  The abscess on her right arm has resolved.  She continues to have chronic weakness and fatigue, but otherwise feels well.  She does not complain of pain today. She has no neurologic complaints.  She has no chest pain, shortness of breath, cough, or hemoptysis.  She denies any nausea, vomiting, constipation, or diarrhea.  She has no melena or hematochezia.  She has no urinary complaints.  Patient offers no further specific complaints today.  REVIEW OF SYSTEMS:   Review of Systems  Constitutional: Positive for malaise/fatigue. Negative for fever and weight loss.  HENT: Negative.  Negative for congestion and sinus pain.   Respiratory: Negative.  Negative for cough, hemoptysis and shortness of breath.   Cardiovascular: Negative.  Negative for chest pain and leg swelling.  Gastrointestinal: Negative.  Negative for abdominal pain, blood in stool, constipation, diarrhea, melena, nausea and vomiting.  Genitourinary: Negative.  Negative for dysuria and flank pain.  Musculoskeletal: Negative.  Negative for back pain.  Skin: Negative.  Negative for rash.  Neurological: Positive for weakness. Negative for dizziness, focal weakness and headaches.  Psychiatric/Behavioral: Negative.  The patient is not nervous/anxious.     As per HPI. Otherwise, a complete review of systems is negative.  PAST MEDICAL HISTORY: Past Medical  History:  Diagnosis Date  . Abnormal Pap smear of cervix   . Anemia    h/o  . Diabetes mellitus without complication ([place])   . Intrahepatic cholangiocarcinoma ([place])     PAST SURGICAL HISTORY: Past Surgical History:  Procedure Laterality Date  . CHOLECYSTECTOMY    . COLONOSCOPY WITH PROPOFOL N/A 06/15/2020   Procedure: COLONOSCOPY WITH PROPOFOL;  Surgeon: Lin Landsman, MD;  Location: [facility];  Service: Endoscopy;  Laterality: N/A;  Diabetic - oral meds  . ENDOMETRIAL ABLATION    . HYSTEROSCOPY WITH D & C    . LEEP    . PORTA CATH INSERTION N/A 01/04/2021   Procedure: PORTA CATH INSERTION;  Surgeon: Algernon Huxley, MD;  Location: [facility];  Service: Cardiovascular;  Laterality: N/A;    FAMILY HISTORY: Family History  Problem Relation Age of Onset  . Bleeding Disorder Mother   . Biliary Cirrhosis Mother   . Liver cancer Mother   . Hypertension Mother   . Diabetes Father   . Lung cancer Father   . Breast cancer Neg Hx     ADVANCED DIRECTIVES (Y/N):  N  HEALTH MAINTENANCE: Social History   Tobacco Use  . Smoking status: Never Smoker  . Smokeless tobacco: Never Used  Vaping Use  . Vaping Use: Never used  Substance Use Topics  . Alcohol use: No  . Drug use: No     Colonoscopy:  PAP:  Bone density:  Lipid panel:  No Known Allergies  Current Outpatient Medications  Medication Sig Dispense Refill  . acetaminophen (TYLENOL) 500 MG tablet Take 500 mg by mouth every 6 (six) hours as needed.    . lidocaine-prilocaine (EMLA) cream Apply  1 application topically as needed. 30 g 2  . metFORMIN (GLUCOPHAGE) 500 MG tablet Take 500 mg by mouth 2 (two) times daily.    . ondansetron (ZOFRAN) 8 MG tablet Take 1 tablet (8 mg total) by mouth every 8 (eight) hours as needed for nausea or vomiting. 30 tablet 2  . sulfamethoxazole-trimethoprim (BACTRIM DS) 800-160 MG tablet Take 1 tablet by mouth 2 (two) times daily. 20 tablet 0  . levofloxacin (LEVAQUIN)  500 MG tablet Take 1 tablet (500 mg total) by mouth daily. (Patient not taking: No sig reported) 5 tablet 0   No current facility-administered medications for this visit.   Facility-Administered Medications Ordered in Other Visits  Medication Dose Route Frequency Provider Last Rate Last Admin  . CISplatin (PLATINOL) 148 mg in sodium chloride 0.9 % 500 mL chemo infusion  80 mg/m2 (Treatment Plan Recorded) Intravenous Once Lloyd Huger, MD      . dexamethasone (DECADRON) 10 mg in sodium chloride 0.9 % 50 mL IVPB  10 mg Intravenous Once Lloyd Huger, MD      . fosaprepitant (EMEND) 150 mg in sodium chloride 0.9 % 145 mL IVPB  150 mg Intravenous Once Lloyd Huger, MD      . gemcitabine (GEMZAR) 1,800 mg in sodium chloride 0.9 % 250 mL chemo infusion  1,800 mg Intravenous Once Lloyd Huger, MD      . heparin lock flush 100 unit/mL  500 Units Intravenous Once Lloyd Huger, MD      . magnesium sulfate IVPB 2 g 50 mL  2 g Intravenous Once Lloyd Huger, MD 50 mL/hr at 01/31/21 0952 2 g at 01/31/21 0952  . palonosetron (ALOXI) injection 0.25 mg  0.25 mg Intravenous Once Lloyd Huger, MD      . sodium chloride flush (NS) 0.9 % injection 10 mL  10 mL Intravenous PRN Lloyd Huger, MD   10 mL at 01/17/21 0854    OBJECTIVE: Vitals:   01/31/21 0851  BP: 132/76  Pulse: 72  Resp: 20  Temp: (!) 97 F (36.1 C)     Body mass index is 26.49 kg/m.    ECOG FS:0 - Asymptomatic   General: Well-developed, well-nourished, no acute distress. Eyes: Pink conjunctiva, anicteric sclera. HEENT: Normocephalic, moist mucous membranes. Lungs: No audible wheezing or coughing. Heart: Regular rate and rhythm. Abdomen: Soft, nontender, no obvious distention. Musculoskeletal: No edema, cyanosis, or clubbing. Neuro: Alert, answering all questions appropriately. Cranial nerves grossly intact. Skin: No rashes or petechiae noted. Psych: Normal affect.   LAB  RESULTS:  Lab Results  Component Value Date   NA 133 (L) 01/31/2021   K 3.6 01/31/2021   CL 103 01/31/2021   CO2 22 01/31/2021   GLUCOSE 164 (H) 01/31/2021   BUN 13 01/31/2021   CREATININE 1.21 (H) 01/31/2021   CALCIUM 8.4 (L) 01/31/2021   PROT 7.3 01/31/2021   ALBUMIN 3.4 (L) 01/31/2021   AST 75 (H) 01/31/2021   ALT 46 (H) 01/31/2021   ALKPHOS 122 01/31/2021   BILITOT 0.5 01/31/2021   GFRNONAA 52 (L) 01/31/2021   GFRAA >60 05/23/2020    Lab Results  Component Value Date   WBC 9.0 01/31/2021   NEUTROABS 6.2 01/31/2021   HGB 9.7 (L) 01/31/2021   HCT 29.8 (L) 01/31/2021   MCV 90.9 01/31/2021   PLT 367 01/31/2021     STUDIES: PERIPHERAL VASCULAR CATHETERIZATION  Result Date: 01/04/2021 See op note  US Venous Img Upper Uni Right  Result Date: 01/23/2021 CLINICAL DATA:  Right upper extremity redness and edema. Cholangiocarcinoma EXAM: RIGHT UPPER EXTREMITY VENOUS DUPLEX ULTRASOUND TECHNIQUE: Gray-scale sonography with graded compression, as well as color Doppler and duplex ultrasound were performed to evaluate the right upper extremity deep venous system from the level of the subclavian vein and including the jugular, axillary, basilic, radial, ulnar and upper cephalic vein. Spectral Doppler was utilized to evaluate flow at rest and with distal augmentation maneuvers. COMPARISON:  None. FINDINGS: Contralateral Subclavian Vein: Respiratory phasicity is normal and symmetric with the symptomatic side. No evidence of thrombus. Normal compressibility. Internal Jugular Vein: No evidence of thrombus. Normal compressibility, respiratory phasicity and response to augmentation. Subclavian Vein: No evidence of thrombus. Normal compressibility, respiratory phasicity and response to augmentation. Axillary Vein: No evidence of thrombus. Normal compressibility, respiratory phasicity and response to augmentation. Cephalic Vein: No evidence of thrombus. Normal compressibility, respiratory phasicity  and response to augmentation. Basilic Vein: No evidence of thrombus. Normal compressibility, respiratory phasicity and response to augmentation. Brachial Veins: No evidence of thrombus. Normal compressibility, respiratory phasicity and response to augmentation. Radial Veins: No evidence of thrombus. Normal compressibility, respiratory phasicity and response to augmentation. Ulnar Veins: No evidence of thrombus. Normal compressibility, respiratory phasicity and response to augmentation. Venous Reflux:  None visualized. Other Findings: There is acute appearing thrombus in a venous structure along the ulnar aspect of the forearm, likely in a superficial vessel. No compression or augmentation in this vessel. IMPRESSION: No evidence of DVT within the right upper extremity. Focus of suspected superficial thrombosis in a fairly superficial vein along the ulnar aspect of the forearm. These results will be called to the ordering clinician or representative by the Radiologist Assistant, and communication documented in the PACS or [employer]. Electronically Signed   By: Lowella Grip III M.D.   On: 01/23/2021 18:18    ASSESSMENT: Stage IV intrahepatic cholangiocarcinoma.  PLAN:    1.  Stage IV intrahepatic cholangiocarcinoma: Incidental finding on routine cholecystectomy. PET scan results from July 13, 2020 as well as MRI results from July 20, 2020 reviewed independently with right lobe liver lesion consistent with intrahepatic cholangiocarcinoma.  Biopsy confirmed the results.  Gynecologic work-up is essentially negative.  Colonoscopy on June 15, 2020 was essentially within normal limits.  Mammogram on July 19, 2020 was reported as BI-RADS 2. All patient tumor markers are negative.  Patient has now had port placement secondary to poor IV access.  Plan to give cisplatin and gemcitabine on day 1 with gemcitabine only on day 8.  This will be a 21-day cycle.  Because of a history of neutropenia,  patient will require Ziextenzo after day 8 of treatment.  Repeat MRI on December 01, 2020 reviewed independently with overall improvement of disease burden, but likely residual malignancy.  Proceed with cycle 7, day 1 of treatment today.  Return to clinic in 1 week for further evaluation and consideration of cycle 7, day 8.  Plan to reimage at the end of June 2022. 2.  Neutropenia: Recurrent.  Continue Ziextenzo after day 8 of each cycle. 3.  Anemia: Chronic and unchanged.  Patient's hemoglobin is trended down slightly to 9.7. 4.   Flank pain: Patient does not complain of this today.  Likely secondary to malignancy.  Abdominal ultrasound was unrevealing.   5.  Elevated liver enzymes: Slightly elevated today, proceed with treatment as above. 6.  Hyperglycemia: Patient has improved blood glucose control. 7.  Hyponatremia: Patient's sodium level 133, monitor. 8.  Renal insufficiency:  Patient's creatinine is trended up slightly to 1.21.  Have encouraged fluid intake.  Monitor.  Patient expressed understanding and was in agreement with this plan. She also understands that She can call clinic at any time with any questions, concerns, or complaints.    Cancer Staging Intrahepatic cholangiocarcinoma ([place]) Staging form: Intrahepatic Bile Duct, AJCC 8th Edition - Clinical stage from 08/04/2020: Stage IV (cT1b, cN0, pM1) - Signed by Lloyd Huger, MD on 08/04/2020   Lloyd Huger, MD   01/31/2021 10:40 AM

## 2021-01-31 ENCOUNTER — Encounter: Payer: Self-pay | Admitting: Oncology

## 2021-01-31 ENCOUNTER — Inpatient Hospital Stay: Payer: BC Managed Care – PPO

## 2021-01-31 ENCOUNTER — Inpatient Hospital Stay (HOSPITAL_BASED_OUTPATIENT_CLINIC_OR_DEPARTMENT_OTHER): Payer: BC Managed Care – PPO | Admitting: Oncology

## 2021-01-31 VITALS — BP 132/76 | HR 72 | Temp 97.0°F | Resp 20 | Wt 159.2 lb

## 2021-01-31 VITALS — BP 139/75 | HR 67 | Resp 18

## 2021-01-31 DIAGNOSIS — C221 Intrahepatic bile duct carcinoma: Secondary | ICD-10-CM

## 2021-01-31 LAB — CBC WITH DIFFERENTIAL/PLATELET
Abs Immature Granulocytes: 0.04 10*3/uL (ref 0.00–0.07)
Basophils Absolute: 0.1 10*3/uL (ref 0.0–0.1)
Basophils Relative: 1 %
Eosinophils Absolute: 0.1 10*3/uL (ref 0.0–0.5)
Eosinophils Relative: 1 %
HCT: 29.8 % — ABNORMAL LOW (ref 36.0–46.0)
Hemoglobin: 9.7 g/dL — ABNORMAL LOW (ref 12.0–15.0)
Immature Granulocytes: 0 %
Lymphocytes Relative: 15 %
Lymphs Abs: 1.3 10*3/uL (ref 0.7–4.0)
MCH: 29.6 pg (ref 26.0–34.0)
MCHC: 32.6 g/dL (ref 30.0–36.0)
MCV: 90.9 fL (ref 80.0–100.0)
Monocytes Absolute: 1.2 10*3/uL — ABNORMAL HIGH (ref 0.1–1.0)
Monocytes Relative: 13 %
Neutro Abs: 6.2 10*3/uL (ref 1.7–7.7)
Neutrophils Relative %: 70 %
Platelets: 367 10*3/uL (ref 150–400)
RBC: 3.28 MIL/uL — ABNORMAL LOW (ref 3.87–5.11)
RDW: 16.9 % — ABNORMAL HIGH (ref 11.5–15.5)
WBC: 9 10*3/uL (ref 4.0–10.5)
nRBC: 0 % (ref 0.0–0.2)

## 2021-01-31 LAB — COMPREHENSIVE METABOLIC PANEL
ALT: 46 U/L — ABNORMAL HIGH (ref 0–44)
AST: 75 U/L — ABNORMAL HIGH (ref 15–41)
Albumin: 3.4 g/dL — ABNORMAL LOW (ref 3.5–5.0)
Alkaline Phosphatase: 122 U/L (ref 38–126)
Anion gap: 8 (ref 5–15)
BUN: 13 mg/dL (ref 6–20)
CO2: 22 mmol/L (ref 22–32)
Calcium: 8.4 mg/dL — ABNORMAL LOW (ref 8.9–10.3)
Chloride: 103 mmol/L (ref 98–111)
Creatinine, Ser: 1.21 mg/dL — ABNORMAL HIGH (ref 0.44–1.00)
GFR, Estimated: 52 mL/min — ABNORMAL LOW (ref >60)
Glucose, Bld: 164 mg/dL — ABNORMAL HIGH (ref 70–99)
Potassium: 3.6 mmol/L (ref 3.5–5.1)
Sodium: 133 mmol/L — ABNORMAL LOW (ref 135–145)
Total Bilirubin: 0.5 mg/dL (ref 0.3–1.2)
Total Protein: 7.3 g/dL (ref 6.5–8.1)

## 2021-01-31 MED ORDER — MAGNESIUM SULFATE 2 GM/50ML IV SOLN
2.0000 g | Freq: Once | INTRAVENOUS | Status: AC
  Administered 2021-01-31: 2 g via INTRAVENOUS
  Filled 2021-01-31: qty 50

## 2021-01-31 MED ORDER — SODIUM CHLORIDE 0.9 % IV SOLN
10.0000 mg | Freq: Once | INTRAVENOUS | Status: AC
  Administered 2021-01-31: 10 mg via INTRAVENOUS
  Filled 2021-01-31: qty 10

## 2021-01-31 MED ORDER — PALONOSETRON HCL INJECTION 0.25 MG/5ML
0.2500 mg | Freq: Once | INTRAVENOUS | Status: AC
  Administered 2021-01-31: 0.25 mg via INTRAVENOUS
  Filled 2021-01-31: qty 5

## 2021-01-31 MED ORDER — HEPARIN SOD (PORK) LOCK FLUSH 100 UNIT/ML IV SOLN
INTRAVENOUS | Status: AC
  Filled 2021-01-31: qty 5

## 2021-01-31 MED ORDER — SODIUM CHLORIDE 0.9 % IV SOLN
150.0000 mg | Freq: Once | INTRAVENOUS | Status: AC
  Administered 2021-01-31: 150 mg via INTRAVENOUS
  Filled 2021-01-31: qty 150

## 2021-01-31 MED ORDER — SODIUM CHLORIDE 0.9 % IV SOLN
1800.0000 mg | Freq: Once | INTRAVENOUS | Status: AC
  Administered 2021-01-31: 1800 mg via INTRAVENOUS
  Filled 2021-01-31: qty 26.3

## 2021-01-31 MED ORDER — SODIUM CHLORIDE 0.9 % IV SOLN
Freq: Once | INTRAVENOUS | Status: AC
  Filled 2021-01-31: qty 250

## 2021-01-31 MED ORDER — HEPARIN SOD (PORK) LOCK FLUSH 100 UNIT/ML IV SOLN
500.0000 [IU] | Freq: Once | INTRAVENOUS | Status: AC | PRN
  Administered 2021-01-31: 500 [IU]
  Filled 2021-01-31: qty 5

## 2021-01-31 MED ORDER — POTASSIUM CHLORIDE IN NACL 20-0.9 MEQ/L-% IV SOLN
Freq: Once | INTRAVENOUS | Status: AC
Start: 2021-01-31 — End: 2021-01-31
  Filled 2021-01-31: qty 1000

## 2021-01-31 MED ORDER — SODIUM CHLORIDE 0.9 % IV SOLN
80.0000 mg/m2 | Freq: Once | INTRAVENOUS | Status: AC
  Administered 2021-01-31: 148 mg via INTRAVENOUS
  Filled 2021-01-31: qty 50

## 2021-01-31 NOTE — Progress Notes (Signed)
Nutrition Follow-up:  Patient with stage IV intrahepatic cholangiocarcinoma.  Patient receiving chemotherapy.    Met with patient during infusion.  Patient reports that her appetite is good, sometimes does not feel like eating but still tries to eat.  Reports that she is eating good sources of protein (chicken, liver, pork chop, eggs, milk, cheese, black beans).  Eating vegetables and fruits as well.     Medications: reviewed  Labs: glucose 164  Anthropometrics:   Weight 159 lb 3.2 oz today  159 lb 14.4 oz on 11/30/20 157 lb on 2/24 159 lb on 2/17 151 lb on 2/3  NUTRITION DIAGNOSIS: Inadequate oral intake stable   INTERVENTION:  Inadequate oral intake improved    MONITORING, EVALUATION, GOAL: weight trends, intake   NEXT VISIT: as needed  Lauren Lindsey, [place], [place] Registered Dietitian [phone] (mobile)

## 2021-01-31 NOTE — Patient Instructions (Signed)
[facility]  Discharge Instructions: Thank you for choosing [facility] to provide your oncology and hematology care.  If you have a lab appointment with the [facility], please go directly to the [facility] and check in at the registration area.  Wear comfortable clothing and clothing appropriate for easy access to any Portacath or PICC line.   We strive to give you quality time with your provider. You may need to reschedule your appointment if you arrive late (15 or more minutes).  Arriving late affects you and other patients whose appointments are after yours.  Also, if you miss three or more appointments without notifying the office, you may be dismissed from the clinic at the provider's discretion.      For prescription refill requests, have your pharmacy contact our office and allow 72 hours for refills to be completed.    Today you received the following chemotherapy and/or immunotherapy agents Gemzar & Cisplatin Cisplatin injection What is this medicine? CISPLATIN (SIS pla tin) is a chemotherapy drug. It targets fast dividing cells, like cancer cells, and causes these cells to die. This medicine is used to treat many types of cancer like bladder, ovarian, and testicular cancers. This medicine may be used for other purposes; ask your health care provider or pharmacist if you have questions. COMMON BRAND NAME(S): Platinol, Platinol -AQ What should I tell my health care provider before I take this medicine? They need to know if you have any of these conditions:  eye disease, vision problems  hearing problems  kidney disease  low blood counts, like white cells, platelets, or red blood cells  tingling of the fingers or toes, or other nerve disorder  an unusual or allergic reaction to cisplatin, carboplatin, oxaliplatin, other medicines, foods, dyes, or preservatives  pregnant or trying to get pregnant  breast-feeding How  should I use this medicine? This drug is given as an infusion into a vein. It is administered in a hospital or clinic by a specially trained health care professional. Talk to your pediatrician regarding the use of this medicine in children. Special care may be needed. Overdosage: If you think you have taken too much of this medicine contact a poison control center or emergency room at once. NOTE: This medicine is only for you. Do not share this medicine with others. What if I miss a dose? It is important not to miss a dose. Call your doctor or health care professional if you are unable to keep an appointment. What may interact with this medicine? This medicine may interact with the following medications:  foscarnet  certain antibiotics like amikacin, gentamicin, neomycin, polymyxin B, streptomycin, tobramycin, vancomycin This list may not describe all possible interactions. Give your health care provider a list of all the medicines, herbs, non-prescription drugs, or dietary supplements you use. Also tell them if you smoke, drink alcohol, or use illegal drugs. Some items may interact with your medicine. What should I watch for while using this medicine? Your condition will be monitored carefully while you are receiving this medicine. You will need important blood work done while you are taking this medicine. This drug may make you feel generally unwell. This is not uncommon, as chemotherapy can affect healthy cells as well as cancer cells. Report any side effects. Continue your course of treatment even though you feel ill unless your doctor tells you to stop. This medicine may increase your risk of getting an infection. Call your healthcare professional  for advice if you get a fever, chills, or sore throat, or other symptoms of a cold or flu. Do not treat yourself. Try to avoid being around people who are sick. Avoid taking medicines that contain aspirin, acetaminophen, ibuprofen, naproxen, or  ketoprofen unless instructed by your healthcare professional. These medicines may hide a fever. This medicine may increase your risk to bruise or bleed. Call your doctor or health care professional if you notice any unusual bleeding. Be careful brushing and flossing your teeth or using a toothpick because you may get an infection or bleed more easily. If you have any dental work done, tell your dentist you are receiving this medicine. Do not become pregnant while taking this medicine or for 14 months after stopping it. Women should inform their healthcare professional if they wish to become pregnant or think they might be pregnant. Men should not father a child while taking this medicine and for 11 months after stopping it. There is potential for serious side effects to an unborn child. Talk to your healthcare professional for more information. Do not breast-feed an infant while taking this medicine. This medicine has caused ovarian failure in some women. This medicine may make it more difficult to get pregnant. Talk to your healthcare professional if you are concerned about your fertility. This medicine has caused decreased sperm counts in some men. This may make it more difficult to father a child. Talk to your healthcare professional if you are concerned about your fertility. Drink fluids as directed while you are taking this medicine. This will help protect your kidneys. Call your doctor or health care professional if you get diarrhea. Do not treat yourself. What side effects may I notice from receiving this medicine? Side effects that you should report to your doctor or health care professional as soon as possible:  allergic reactions like skin rash, itching or hives, swelling of the face, lips, or tongue  blurred vision  changes in vision  decreased hearing or ringing of the ears  nausea, vomiting  pain, redness, or irritation at site where injected  pain, tingling, numbness in the  hands or feet  signs and symptoms of bleeding such as bloody or black, tarry stools; red or dark brown urine; spitting up blood or brown material that looks like coffee grounds; red spots on the skin; unusual bruising or bleeding from the eyes, gums, or nose  signs and symptoms of infection like fever; chills; cough; sore throat; pain or trouble passing urine  signs and symptoms of kidney injury like trouble passing urine or change in the amount of urine  signs and symptoms of low red blood cells or anemia such as unusually weak or tired; feeling faint or lightheaded; falls; breathing problems Side effects that usually do not require medical attention (report to your doctor or health care professional if they continue or are bothersome):  loss of appetite  mouth sores  muscle cramps This list may not describe all possible side effects. Call your doctor for medical advice about side effects. You may report side effects to FDA at 1-800-FDA-1088. Where should I keep my medicine? This drug is given in a hospital or clinic and will not be stored at home. NOTE: This sheet is a summary. It may not cover all possible information. If you have questions about this medicine, talk to your doctor, pharmacist, or health care provider.  2021 Elsevier/Gold Standard (2018-09-04 15:59:17) Gemcitabine injection What is this medicine? GEMCITABINE (jem SYE ta been) is a  chemotherapy drug. This medicine is used to treat many types of cancer like breast cancer, lung cancer, pancreatic cancer, and ovarian cancer. This medicine may be used for other purposes; ask your health care provider or pharmacist if you have questions. COMMON BRAND NAME(S): Gemzar, Infugem What should I tell my health care provider before I take this medicine? They need to know if you have any of these conditions:  blood disorders  infection  kidney disease  liver disease  lung or breathing disease, like asthma  recent or  ongoing radiation therapy  an unusual or allergic reaction to gemcitabine, other chemotherapy, other medicines, foods, dyes, or preservatives  pregnant or trying to get pregnant  breast-feeding How should I use this medicine? This drug is given as an infusion into a vein. It is administered in a hospital or clinic by a specially trained health care professional. Talk to your pediatrician regarding the use of this medicine in children. Special care may be needed. Overdosage: If you think you have taken too much of this medicine contact a poison control center or emergency room at once. NOTE: This medicine is only for you. Do not share this medicine with others. What if I miss a dose? It is important not to miss your dose. Call your doctor or health care professional if you are unable to keep an appointment. What may interact with this medicine?  medicines to increase blood counts like filgrastim, pegfilgrastim, sargramostim  some other chemotherapy drugs like cisplatin  vaccines Talk to your doctor or health care professional before taking any of these medicines:  acetaminophen  aspirin  ibuprofen  ketoprofen  naproxen This list may not describe all possible interactions. Give your health care provider a list of all the medicines, herbs, non-prescription drugs, or dietary supplements you use. Also tell them if you smoke, drink alcohol, or use illegal drugs. Some items may interact with your medicine. What should I watch for while using this medicine? Visit your doctor for checks on your progress. This drug may make you feel generally unwell. This is not uncommon, as chemotherapy can affect healthy cells as well as cancer cells. Report any side effects. Continue your course of treatment even though you feel ill unless your doctor tells you to stop. In some cases, you may be given additional medicines to help with side effects. Follow all directions for their use. Call your doctor or  health care professional for advice if you get a fever, chills or sore throat, or other symptoms of a cold or flu. Do not treat yourself. This drug decreases your body's ability to fight infections. Try to avoid being around people who are sick. This medicine may increase your risk to bruise or bleed. Call your doctor or health care professional if you notice any unusual bleeding. Be careful brushing and flossing your teeth or using a toothpick because you may get an infection or bleed more easily. If you have any dental work done, tell your dentist you are receiving this medicine. Avoid taking products that contain aspirin, acetaminophen, ibuprofen, naproxen, or ketoprofen unless instructed by your doctor. These medicines may hide a fever. Do not become pregnant while taking this medicine or for 6 months after stopping it. Women should inform their doctor if they wish to become pregnant or think they might be pregnant. Men should not father a child while taking this medicine and for 3 months after stopping it. There is a potential for serious side effects to an unborn child.  Talk to your health care professional or pharmacist for more information. Do not breast-feed an infant while taking this medicine or for at least 1 week after stopping it. Men should inform their doctors if they wish to father a child. This medicine may lower sperm counts. Talk with your doctor or health care professional if you are concerned about your fertility. What side effects may I notice from receiving this medicine? Side effects that you should report to your doctor or health care professional as soon as possible:  allergic reactions like skin rash, itching or hives, swelling of the face, lips, or tongue  breathing problems  pain, redness, or irritation at site where injected  signs and symptoms of a dangerous change in heartbeat or heart rhythm like chest pain; dizziness; fast or irregular heartbeat; palpitations;  feeling faint or lightheaded, falls; breathing problems  signs of decreased platelets or bleeding - bruising, pinpoint red spots on the skin, black, tarry stools, blood in the urine  signs of decreased red blood cells - unusually weak or tired, feeling faint or lightheaded, falls  signs of infection - fever or chills, cough, sore throat, pain or difficulty passing urine  signs and symptoms of kidney injury like trouble passing urine or change in the amount of urine  signs and symptoms of liver injury like dark yellow or brown urine; general ill feeling or flu-like symptoms; light-colored stools; loss of appetite; nausea; right upper belly pain; unusually weak or tired; yellowing of the eyes or skin  swelling of ankles, feet, hands Side effects that usually do not require medical attention (report to your doctor or health care professional if they continue or are bothersome):  constipation  diarrhea  hair loss  loss of appetite  nausea  rash  vomiting This list may not describe all possible side effects. Call your doctor for medical advice about side effects. You may report side effects to FDA at 1-800-FDA-1088. Where should I keep my medicine? This drug is given in a hospital or clinic and will not be stored at home. NOTE: This sheet is a summary. It may not cover all possible information. If you have questions about this medicine, talk to your doctor, pharmacist, or health care provider.  2021 Elsevier/Gold Standard (2017-12-03 18:06:11)       To help prevent nausea and vomiting after your treatment, we encourage you to take your nausea medication as directed.  BELOW ARE SYMPTOMS THAT SHOULD BE REPORTED IMMEDIATELY: . *FEVER GREATER THAN 100.4 F (38 C) OR HIGHER . *CHILLS OR SWEATING . *NAUSEA AND VOMITING THAT IS NOT CONTROLLED WITH YOUR NAUSEA MEDICATION . *UNUSUAL SHORTNESS OF BREATH . *UNUSUAL BRUISING OR BLEEDING . *URINARY PROBLEMS (pain or burning when urinating,  or frequent urination) . *BOWEL PROBLEMS (unusual diarrhea, constipation, pain near the anus) . TENDERNESS IN MOUTH AND THROAT WITH OR WITHOUT PRESENCE OF ULCERS (sore throat, sores in mouth, or a toothache) . UNUSUAL RASH, SWELLING OR PAIN  . UNUSUAL VAGINAL DISCHARGE OR ITCHING   Items with * indicate a potential emergency and should be followed up as soon as possible or go to the Emergency Department if any problems should occur.  Please show the CHEMOTHERAPY ALERT CARD or IMMUNOTHERAPY ALERT CARD at check-in to the Emergency Department and triage nurse.  Should you have questions after your visit or need to cancel or reschedule your appointment, please contact [facility]  [phone] and follow the prompts.  Office hours are 8:00 a.m. to  4:30 p.m. Monday - Friday. Please note that voicemails left after 4:00 p.m. may not be returned until the following business day.  We are closed weekends and major holidays. You have access to a nurse at all times for urgent questions. Please call the main number to the clinic [phone] and follow the prompts.  For any non-urgent questions, you may also contact your provider using MyChart. We now offer e-Visits for anyone 27 and older to request care online for non-urgent symptoms. For details visit [email or web address].   Also download the MyChart app! Go to the app store, search "MyChart", open the app, select [facility], and log in with your MyChart username and password.  Due to Covid, a mask is required upon entering the hospital/clinic. If you do not have a mask, one will be given to you upon arrival. For doctor visits, patients may have 1 support person aged 89 or older with them. For treatment visits, patients cannot have anyone with them due to current Covid guidelines and our immunocompromised population.

## 2021-01-31 NOTE — Progress Notes (Signed)
Patient denies any concerns today.  

## 2021-02-03 NOTE — Progress Notes (Signed)
[facility]  [phone]) [phone] Fax:(336) [phone]  ID: [name] OB: Sep 27, 1960  MR#: [id number]  [id number]  Patient Care Team: [name], MD as PCP - General (Family Medicine) Patient, No Pcp Per (Inactive) (General Practice) [name], RN as Oncology Nurse Navigator [name], [name], MD as Consulting Physician (Oncology)   CHIEF COMPLAINT: Stage IV intrahepatic cholangiocarcinoma.  INTERVAL HISTORY: Patient returns to clinic today for further evaluation and consideration of cycle 7, day 8 of cisplatin and gemcitabine.  Gemcitabine only.  She has increased weakness and fatigue and poor appetite associated with nausea this past week.  She reports it is no different than previous after receiving cisplatin. She does not complain of pain today. She has no neurologic complaints.  She has no chest pain, shortness of breath, cough, or hemoptysis.  She denies any nausea, vomiting, constipation, or diarrhea.  She has no melena or hematochezia.  She has no urinary complaints.  Patient offers no further specific complaints today.  REVIEW OF SYSTEMS:   Review of Systems  Constitutional: Positive for malaise/fatigue. Negative for fever and weight loss.  HENT: Negative.  Negative for congestion and sinus pain.   Respiratory: Negative.  Negative for cough, hemoptysis and shortness of breath.   Cardiovascular: Negative.  Negative for chest pain and leg swelling.  Gastrointestinal: Positive for nausea. Negative for abdominal pain, blood in stool, constipation, diarrhea, melena and vomiting.  Genitourinary: Negative.  Negative for dysuria and flank pain.  Musculoskeletal: Negative.  Negative for back pain.  Skin: Negative.  Negative for rash.  Neurological: Positive for weakness. Negative for dizziness, focal weakness and headaches.  Psychiatric/Behavioral: Negative.  The patient is not nervous/anxious.     As per HPI. Otherwise, a complete review  of systems is negative.  PAST MEDICAL HISTORY: Past Medical History:  Diagnosis Date  . Abnormal Pap smear of cervix   . Anemia    h/o  . Diabetes mellitus without complication ([place])   . Intrahepatic cholangiocarcinoma ([place])     PAST SURGICAL HISTORY: Past Surgical History:  Procedure Laterality Date  . CHOLECYSTECTOMY    . COLONOSCOPY WITH PROPOFOL N/A 06/15/2020   Procedure: COLONOSCOPY WITH PROPOFOL;  Surgeon: Lin Landsman, MD;  Location: [facility];  Service: Endoscopy;  Laterality: N/A;  Diabetic - oral meds  . ENDOMETRIAL ABLATION    . HYSTEROSCOPY WITH D & C    . LEEP    . PORTA CATH INSERTION N/A 01/04/2021   Procedure: PORTA CATH INSERTION;  Surgeon: Algernon Huxley, MD;  Location: [facility];  Service: Cardiovascular;  Laterality: N/A;    FAMILY HISTORY: Family History  Problem Relation Age of Onset  . Bleeding Disorder Mother   . Biliary Cirrhosis Mother   . Liver cancer Mother   . Hypertension Mother   . Diabetes Father   . Lung cancer Father   . Breast cancer Neg Hx     ADVANCED DIRECTIVES (Y/N):  N  HEALTH MAINTENANCE: Social History   Tobacco Use  . Smoking status: Never Smoker  . Smokeless tobacco: Never Used  Vaping Use  . Vaping Use: Never used  Substance Use Topics  . Alcohol use: No  . Drug use: No     Colonoscopy:  PAP:  Bone density:  Lipid panel:  No Known Allergies  Current Outpatient Medications  Medication Sig Dispense Refill  . acetaminophen (TYLENOL) 500 MG tablet Take 500 mg by mouth every 6 (six) hours as needed.    Marland Kitchen  levofloxacin (LEVAQUIN) 500 MG tablet Take 1 tablet (500 mg total) by mouth daily. (Patient not taking: No sig reported) 5 tablet 0  . lidocaine-prilocaine (EMLA) cream Apply 1 application topically as needed. 30 g 2  . metFORMIN (GLUCOPHAGE) 500 MG tablet Take 500 mg by mouth 2 (two) times daily.    . ondansetron (ZOFRAN) 8 MG tablet Take 1 tablet (8 mg total) by mouth every 8 (eight)  hours as needed for nausea or vomiting. 30 tablet 2  . sulfamethoxazole-trimethoprim (BACTRIM DS) 800-160 MG tablet Take 1 tablet by mouth 2 (two) times daily. 20 tablet 0   No current facility-administered medications for this visit.   Facility-Administered Medications Ordered in Other Visits  Medication Dose Route Frequency Provider Last Rate Last Admin  . 0.9 %  sodium chloride infusion   Intravenous Once Lloyd Huger, MD      . gemcitabine (GEMZAR) 1,800 mg in sodium chloride 0.9 % 250 mL chemo infusion  1,800 mg Intravenous Once Lloyd Huger, MD      . heparin lock flush 100 unit/mL  500 Units Intravenous Once Lloyd Huger, MD      . heparin lock flush 100 unit/mL  500 Units Intracatheter Once PRN Lloyd Huger, MD      . prochlorperazine (COMPAZINE) tablet 10 mg  10 mg Oral Once Lloyd Huger, MD      . sodium chloride flush (NS) 0.9 % injection 10 mL  10 mL Intravenous PRN Lloyd Huger, MD   10 mL at 01/17/21 0854    OBJECTIVE: Vitals:   02/07/21 0902  BP: (!) 148/77  Pulse: 84  Resp: 20  Temp: 98 F (36.7 C)     Body mass index is 25.79 kg/m.    ECOG FS:0 - Asymptomatic   General: Well-developed, well-nourished, no acute distress. Eyes: Pink conjunctiva, anicteric sclera. HEENT: Normocephalic, moist mucous membranes. Lungs: No audible wheezing or coughing. Heart: Regular rate and rhythm. Abdomen: Soft, nontender, no obvious distention. Musculoskeletal: No edema, cyanosis, or clubbing. Neuro: Alert, answering all questions appropriately. Cranial nerves grossly intact. Skin: No rashes or petechiae noted. Psych: Normal affect.  LAB RESULTS:  Lab Results  Component Value Date   NA 132 (L) 02/07/2021   K 3.1 (L) 02/07/2021   CL 98 02/07/2021   CO2 26 02/07/2021   GLUCOSE 147 (H) 02/07/2021   BUN 18 02/07/2021   CREATININE 1.16 (H) 02/07/2021   CALCIUM 8.6 (L) 02/07/2021   PROT 8.0 02/07/2021   ALBUMIN 3.5 02/07/2021   AST  41 02/07/2021   ALT 38 02/07/2021   ALKPHOS 103 02/07/2021   BILITOT 0.7 02/07/2021   GFRNONAA 55 (L) 02/07/2021   GFRAA >60 05/23/2020    Lab Results  Component Value Date   WBC 6.9 02/07/2021   NEUTROABS 3.9 02/07/2021   HGB 9.9 (L) 02/07/2021   HCT 30.2 (L) 02/07/2021   MCV 87.8 02/07/2021   PLT 268 02/07/2021     STUDIES: US Venous Img Upper Uni Right  Result Date: 01/23/2021 CLINICAL DATA:  Right upper extremity redness and edema. Cholangiocarcinoma EXAM: RIGHT UPPER EXTREMITY VENOUS DUPLEX ULTRASOUND TECHNIQUE: Gray-scale sonography with graded compression, as well as color Doppler and duplex ultrasound were performed to evaluate the right upper extremity deep venous system from the level of the subclavian vein and including the jugular, axillary, basilic, radial, ulnar and upper cephalic vein. Spectral Doppler was utilized to evaluate flow at rest and with distal augmentation maneuvers. COMPARISON:  None.  FINDINGS: Contralateral Subclavian Vein: Respiratory phasicity is normal and symmetric with the symptomatic side. No evidence of thrombus. Normal compressibility. Internal Jugular Vein: No evidence of thrombus. Normal compressibility, respiratory phasicity and response to augmentation. Subclavian Vein: No evidence of thrombus. Normal compressibility, respiratory phasicity and response to augmentation. Axillary Vein: No evidence of thrombus. Normal compressibility, respiratory phasicity and response to augmentation. Cephalic Vein: No evidence of thrombus. Normal compressibility, respiratory phasicity and response to augmentation. Basilic Vein: No evidence of thrombus. Normal compressibility, respiratory phasicity and response to augmentation. Brachial Veins: No evidence of thrombus. Normal compressibility, respiratory phasicity and response to augmentation. Radial Veins: No evidence of thrombus. Normal compressibility, respiratory phasicity and response to augmentation. Ulnar Veins: No  evidence of thrombus. Normal compressibility, respiratory phasicity and response to augmentation. Venous Reflux:  None visualized. Other Findings: There is acute appearing thrombus in a venous structure along the ulnar aspect of the forearm, likely in a superficial vessel. No compression or augmentation in this vessel. IMPRESSION: No evidence of DVT within the right upper extremity. Focus of suspected superficial thrombosis in a fairly superficial vein along the ulnar aspect of the forearm. These results will be called to the ordering clinician or representative by the Radiologist Assistant, and communication documented in the PACS or [employer]. Electronically Signed   By: Lowella Grip III M.D.   On: 01/23/2021 18:18    ASSESSMENT: Stage IV intrahepatic cholangiocarcinoma.  PLAN:    1.  Stage IV intrahepatic cholangiocarcinoma: Incidental finding on routine cholecystectomy. PET scan results from July 13, 2020 as well as MRI results from July 20, 2020 reviewed independently with right lobe liver lesion consistent with intrahepatic cholangiocarcinoma.  Biopsy confirmed the results.  Gynecologic work-up is essentially negative.  Colonoscopy on June 15, 2020 was essentially within normal limits.  Mammogram on July 19, 2020 was reported as BI-RADS 2. All patient tumor markers are negative.  Patient has now had port placement secondary to poor IV access.  Plan to give cisplatin and gemcitabine on day 1 with gemcitabine only on day 8.  This will be a 21-day cycle.  Because of a history of neutropenia, patient will require Ziextenzo after day 8 of treatment.  Repeat MRI on December 01, 2020 reviewed independently with overall improvement of disease burden, but likely residual malignancy.  Proceed with cycle 7, day 8 of treatment today.  Return to clinic on Friday for [place].  Return to clinic in 2 weeks for further evaluation and consideration of cycle 8, day 1. Plan to reimage at the end of  June 2022. 2.  Neutropenia: Recurrent.  Continue Ziextenzo after day 8 of each cycle. 3.  Anemia: Chronic and unchanged.  Patient hemoglobin is 9.9 today. 4.   Flank pain: Patient does not complain of this today.  Likely secondary to malignancy.  Abdominal ultrasound was unrevealing.   5.  Elevated liver enzymes: Resolved. 6.  Hyperglycemia: Chronic and unchanged.  Patient has improved blood glucose control. 7.  Hyponatremia: Patient's sodium continues to trend down and is now 132.  Monitor. 8.  Renal insufficiency: Slightly improved.  Patient's creatinine is 1.16 today. 9.  Hypokalemia: Monitor.  Consider oral supplementation in the future.  Patient expressed understanding and was in agreement with this plan. She also understands that She can call clinic at any time with any questions, concerns, or complaints.    Cancer Staging Intrahepatic cholangiocarcinoma ([place]) Staging form: Intrahepatic Bile Duct, AJCC 8th Edition - Clinical stage from 08/04/2020: Stage IV (cT1b, cN0, pM1) -  Signed by Lloyd Huger, MD on 08/04/2020   Lloyd Huger, MD   02/07/2021 9:43 AM

## 2021-02-07 ENCOUNTER — Inpatient Hospital Stay: Payer: BC Managed Care – PPO

## 2021-02-07 ENCOUNTER — Encounter: Payer: Self-pay | Admitting: Oncology

## 2021-02-07 ENCOUNTER — Other Ambulatory Visit: Payer: Self-pay

## 2021-02-07 ENCOUNTER — Inpatient Hospital Stay (HOSPITAL_BASED_OUTPATIENT_CLINIC_OR_DEPARTMENT_OTHER): Payer: BC Managed Care – PPO | Admitting: Oncology

## 2021-02-07 VITALS — BP 148/77 | HR 84 | Temp 98.0°F | Resp 20 | Wt 155.0 lb

## 2021-02-07 DIAGNOSIS — C221 Intrahepatic bile duct carcinoma: Secondary | ICD-10-CM

## 2021-02-07 LAB — COMPREHENSIVE METABOLIC PANEL
ALT: 38 U/L (ref 0–44)
AST: 41 U/L (ref 15–41)
Albumin: 3.5 g/dL (ref 3.5–5.0)
Alkaline Phosphatase: 103 U/L (ref 38–126)
Anion gap: 8 (ref 5–15)
BUN: 18 mg/dL (ref 6–20)
CO2: 26 mmol/L (ref 22–32)
Calcium: 8.6 mg/dL — ABNORMAL LOW (ref 8.9–10.3)
Chloride: 98 mmol/L (ref 98–111)
Creatinine, Ser: 1.16 mg/dL — ABNORMAL HIGH (ref 0.44–1.00)
GFR, Estimated: 55 mL/min — ABNORMAL LOW (ref >60)
Glucose, Bld: 147 mg/dL — ABNORMAL HIGH (ref 70–99)
Potassium: 3.1 mmol/L — ABNORMAL LOW (ref 3.5–5.1)
Sodium: 132 mmol/L — ABNORMAL LOW (ref 135–145)
Total Bilirubin: 0.7 mg/dL (ref 0.3–1.2)
Total Protein: 8 g/dL (ref 6.5–8.1)

## 2021-02-07 LAB — CBC WITH DIFFERENTIAL/PLATELET
Abs Immature Granulocytes: 0.02 10*3/uL (ref 0.00–0.07)
Basophils Absolute: 0.1 10*3/uL (ref 0.0–0.1)
Basophils Relative: 1 %
Eosinophils Absolute: 0 10*3/uL (ref 0.0–0.5)
Eosinophils Relative: 0 %
HCT: 30.2 % — ABNORMAL LOW (ref 36.0–46.0)
Hemoglobin: 9.9 g/dL — ABNORMAL LOW (ref 12.0–15.0)
Immature Granulocytes: 0 %
Lymphocytes Relative: 27 %
Lymphs Abs: 1.9 10*3/uL (ref 0.7–4.0)
MCH: 28.8 pg (ref 26.0–34.0)
MCHC: 32.8 g/dL (ref 30.0–36.0)
MCV: 87.8 fL (ref 80.0–100.0)
Monocytes Absolute: 1.1 10*3/uL — ABNORMAL HIGH (ref 0.1–1.0)
Monocytes Relative: 16 %
Neutro Abs: 3.9 10*3/uL (ref 1.7–7.7)
Neutrophils Relative %: 56 %
Platelets: 268 10*3/uL (ref 150–400)
RBC: 3.44 MIL/uL — ABNORMAL LOW (ref 3.87–5.11)
RDW: 15.7 % — ABNORMAL HIGH (ref 11.5–15.5)
WBC: 6.9 10*3/uL (ref 4.0–10.5)
nRBC: 0 % (ref 0.0–0.2)

## 2021-02-07 MED ORDER — SODIUM CHLORIDE 0.9 % IV SOLN: 8.0000 mg | Freq: Once | INTRAVENOUS | Status: DC

## 2021-02-07 MED ORDER — PROCHLORPERAZINE MALEATE 10 MG PO TABS: 10.0000 mg | ORAL_TABLET | Freq: Once | ORAL | Status: DC

## 2021-02-07 MED ORDER — SODIUM CHLORIDE 0.9 % IV SOLN
1800.0000 mg | Freq: Once | INTRAVENOUS | Status: AC
  Administered 2021-02-07: 1800 mg via INTRAVENOUS
  Filled 2021-02-07: qty 26.3

## 2021-02-07 MED ORDER — HEPARIN SOD (PORK) LOCK FLUSH 100 UNIT/ML IV SOLN
INTRAVENOUS | Status: AC
  Filled 2021-02-07: qty 5

## 2021-02-07 MED ORDER — SODIUM CHLORIDE 0.9 % IV SOLN
Freq: Once | INTRAVENOUS | Status: AC
Start: 2021-02-07 — End: 2021-02-07
  Filled 2021-02-07: qty 250

## 2021-02-07 MED ORDER — HEPARIN SOD (PORK) LOCK FLUSH 100 UNIT/ML IV SOLN
500.0000 [IU] | Freq: Once | INTRAVENOUS | Status: AC | PRN
  Administered 2021-02-07: 500 [IU]
  Filled 2021-02-07: qty 5

## 2021-02-07 MED ORDER — ONDANSETRON HCL 4 MG/2ML IJ SOLN
8.0000 mg | Freq: Once | INTRAMUSCULAR | Status: AC
  Administered 2021-02-07: 8 mg via INTRAVENOUS
  Filled 2021-02-07: qty 4

## 2021-02-07 NOTE — Progress Notes (Signed)
Pt evaluated by MD prior to coming to infusion room.  Pt complaining of nausea since this am. Pt had episode of vomiting in bathroom. Per MD- switch to Zofran 8 mg IV.the patient states had relief with zofran. Completed treatment without further incident.

## 2021-02-07 NOTE — Patient Instructions (Signed)
[facility]  Discharge Instructions: Thank you for choosing [facility] to provide your oncology and hematology care.  If you have a lab appointment with the Cancer Center, please go directly to the Cancer Center and check in at the registration area.  Wear comfortable clothing and clothing appropriate for easy access to any Portacath or PICC line.   We strive to give you quality time with your provider. You may need to reschedule your appointment if you arrive late (15 or more minutes).  Arriving late affects you and other patients whose appointments are after yours.  Also, if you miss three or more appointments without notifying the office, you may be dismissed from the clinic at the provider's discretion.      For prescription refill requests, have your pharmacy contact our office and allow 72 hours for refills to be completed.    Today you received the following chemotherapy and/or immunotherapy agents Gemzar   To help prevent nausea and vomiting after your treatment, we encourage you to take your nausea medication as directed.  BELOW ARE SYMPTOMS THAT SHOULD BE REPORTED IMMEDIATELY: *FEVER GREATER THAN 100.4 F (38 C) OR HIGHER *CHILLS OR SWEATING *NAUSEA AND VOMITING THAT IS NOT CONTROLLED WITH YOUR NAUSEA MEDICATION *UNUSUAL SHORTNESS OF BREATH *UNUSUAL BRUISING OR BLEEDING *URINARY PROBLEMS (pain or burning when urinating, or frequent urination) *BOWEL PROBLEMS (unusual diarrhea, constipation, pain near the anus) TENDERNESS IN MOUTH AND THROAT WITH OR WITHOUT PRESENCE OF ULCERS (sore throat, sores in mouth, or a toothache) UNUSUAL RASH, SWELLING OR PAIN  UNUSUAL VAGINAL DISCHARGE OR ITCHING   Items with * indicate a potential emergency and should be followed up as soon as possible or go to the Emergency Department if any problems should occur.  Please show the CHEMOTHERAPY ALERT CARD or IMMUNOTHERAPY ALERT CARD at check-in to the  Emergency Department and triage nurse.  Should you have questions after your visit or need to cancel or reschedule your appointment, please contact [facility]  [phone] and follow the prompts.  Office hours are 8:00 a.m. to 4:30 p.m. Monday - Friday. Please note that voicemails left after 4:00 p.m. may not be returned until the following business day.  We are closed weekends and major holidays. You have access to a nurse at all times for urgent questions. Please call the main number to the clinic [phone] and follow the prompts.  For any non-urgent questions, you may also contact your provider using MyChart. We now offer e-Visits for anyone 18 and older to request care online for non-urgent symptoms. For details visit [email or web address].   Also download the MyChart app! Go to the app store, search "MyChart", open the app, select [facility], and log in with your MyChart username and password.  Due to Covid, a mask is required upon entering the hospital/clinic. If you do not have a mask, one will be given to you upon arrival. For doctor visits, patients may have 1 support person aged 18 or older with them. For treatment visits, patients cannot have anyone with them due to current Covid guidelines and our immunocompromised population.  

## 2021-02-08 ENCOUNTER — Inpatient Hospital Stay: Payer: BC Managed Care – PPO

## 2021-02-08 DIAGNOSIS — C221 Intrahepatic bile duct carcinoma: Secondary | ICD-10-CM

## 2021-02-08 MED ORDER — PEGFILGRASTIM-BMEZ 6 MG/0.6ML ~~LOC~~ SOSY
6.0000 mg | PREFILLED_SYRINGE | Freq: Once | SUBCUTANEOUS | Status: AC
Start: 2021-02-08 — End: 2021-02-08
  Administered 2021-02-08: 6 mg via SUBCUTANEOUS
  Filled 2021-02-08: qty 0.6

## 2021-02-15 NOTE — Progress Notes (Signed)
[place]  [phone]) [phone] Fax:(336) [phone]  ID: Lauren Lindsey OB: Aug 20, 1961  MR#: [id number]  OBS#:[id number]  Patient Care Team: [name], MD as PCP - General (Family Medicine) Patient, No Pcp Per (Inactive) (General Practice) [name], RN as Oncology Nurse Navigator [name], [name], MD as Consulting Physician (Oncology)   CHIEF COMPLAINT: Stage IV intrahepatic cholangiocarcinoma.  INTERVAL HISTORY: Patient returns to clinic today for further evaluation and consideration of cycle 8, day 1 of cisplatin and gemcitabine.  She has increased weakness and fatigue over this past week as well as persistently cold.  She also has a poor appetite associated with increased nausea and vomiting.  She does not complain of pain today. She has no neurologic complaints.  She has no chest pain, shortness of breath, cough, or hemoptysis. She has no melena or hematochezia.  She has no urinary complaints.  Patient feels generally terrible, but offers no further specific complaints today.  REVIEW OF SYSTEMS:   Review of Systems  Constitutional: Positive for malaise/fatigue. Negative for fever and weight loss.  HENT: Negative.  Negative for congestion and sinus pain.   Respiratory: Negative.  Negative for cough, hemoptysis and shortness of breath.   Cardiovascular: Negative.  Negative for chest pain and leg swelling.  Gastrointestinal: Positive for nausea and vomiting. Negative for abdominal pain, blood in stool, constipation, diarrhea and melena.  Genitourinary: Negative.  Negative for dysuria and flank pain.  Musculoskeletal: Negative.  Negative for back pain.  Skin: Negative.  Negative for rash.  Neurological: Positive for weakness. Negative for dizziness, focal weakness and headaches.  Psychiatric/Behavioral: Negative.  The patient is not nervous/anxious.     As per HPI. Otherwise, a complete review of systems is negative.  PAST MEDICAL  HISTORY: Past Medical History:  Diagnosis Date  . Abnormal Pap smear of cervix   . Anemia    h/o  . Diabetes mellitus without complication ([place])   . Intrahepatic cholangiocarcinoma ([place])     PAST SURGICAL HISTORY: Past Surgical History:  Procedure Laterality Date  . CHOLECYSTECTOMY    . COLONOSCOPY WITH PROPOFOL N/A 06/15/2020   Procedure: COLONOSCOPY WITH PROPOFOL;  Surgeon: Lin Landsman, MD;  Location: [facility];  Service: Endoscopy;  Laterality: N/A;  Diabetic - oral meds  . ENDOMETRIAL ABLATION    . HYSTEROSCOPY WITH D & C    . LEEP    . PORTA CATH INSERTION N/A 01/04/2021   Procedure: PORTA CATH INSERTION;  Surgeon: Algernon Huxley, MD;  Location: [facility];  Service: Cardiovascular;  Laterality: N/A;    FAMILY HISTORY: Family History  Problem Relation Age of Onset  . Bleeding Disorder Mother   . Biliary Cirrhosis Mother   . Liver cancer Mother   . Hypertension Mother   . Diabetes Father   . Lung cancer Father   . Breast cancer Neg Hx     ADVANCED DIRECTIVES (Y/N):  N  HEALTH MAINTENANCE: Social History   Tobacco Use  . Smoking status: Never Smoker  . Smokeless tobacco: Never Used  Vaping Use  . Vaping Use: Never used  Substance Use Topics  . Alcohol use: No  . Drug use: No     Colonoscopy:  PAP:  Bone density:  Lipid panel:  No Known Allergies  Current Outpatient Medications  Medication Sig Dispense Refill  . acetaminophen (TYLENOL) 500 MG tablet Take 500 mg by mouth every 6 (six) hours as needed.    . lidocaine-prilocaine (EMLA)  cream Apply 1 application topically as needed. 30 g 2  . metFORMIN (GLUCOPHAGE) 500 MG tablet Take 500 mg by mouth 2 (two) times daily.    . ondansetron (ZOFRAN) 8 MG tablet Take 1 tablet (8 mg total) by mouth every 8 (eight) hours as needed for nausea or vomiting. 30 tablet 2  . sulfamethoxazole-trimethoprim (BACTRIM DS) 800-160 MG tablet Take 1 tablet by mouth 2 (two) times daily. 20 tablet 0  .  levofloxacin (LEVAQUIN) 500 MG tablet Take 1 tablet (500 mg total) by mouth daily. (Patient not taking: No sig reported) 5 tablet 0   No current facility-administered medications for this visit.   Facility-Administered Medications Ordered in Other Visits  Medication Dose Route Frequency Provider Last Rate Last Admin  . heparin lock flush 100 unit/mL  500 Units Intravenous Once Lloyd Huger, MD      . sodium chloride flush (NS) 0.9 % injection 10 mL  10 mL Intravenous PRN Lloyd Huger, MD   10 mL at 01/17/21 0854    OBJECTIVE: Vitals:   02/21/21 0857  BP: 110/62  Pulse: 80  Temp: 99.1 F (37.3 C)  SpO2: 100%     Body mass index is 25.89 kg/m.    ECOG FS:1 - Symptomatic but completely ambulatory   General: Well-developed, well-nourished, no acute distress. Eyes: Pink conjunctiva, anicteric sclera. HEENT: Normocephalic, moist mucous membranes. Lungs: No audible wheezing or coughing. Heart: Regular rate and rhythm. Abdomen: Soft, nontender, no obvious distention. Musculoskeletal: No edema, cyanosis, or clubbing. Neuro: Alert, answering all questions appropriately. Cranial nerves grossly intact. Skin: No rashes or petechiae noted. Psych: Normal affect.  LAB RESULTS:  Lab Results  Component Value Date   NA 137 02/21/2021   K 3.3 (L) 02/21/2021   CL 99 02/21/2021   CO2 27 02/21/2021   GLUCOSE 185 (H) 02/21/2021   BUN 14 02/21/2021   CREATININE 0.84 02/21/2021   CALCIUM 8.5 (L) 02/21/2021   PROT 7.0 02/21/2021   ALBUMIN 3.0 (L) 02/21/2021   AST 33 02/21/2021   ALT 19 02/21/2021   ALKPHOS 117 02/21/2021   BILITOT 0.6 02/21/2021   GFRNONAA >60 02/21/2021   GFRAA >60 05/23/2020    Lab Results  Component Value Date   WBC 13.4 (H) 02/21/2021   NEUTROABS 10.1 (H) 02/21/2021   HGB 8.3 (L) 02/21/2021   HCT 25.4 (L) 02/21/2021   MCV 89.4 02/21/2021   PLT 494 (H) 02/21/2021     STUDIES: US Venous Img Upper Uni Right  Result Date: 01/23/2021 CLINICAL  DATA:  Right upper extremity redness and edema. Cholangiocarcinoma EXAM: RIGHT UPPER EXTREMITY VENOUS DUPLEX ULTRASOUND TECHNIQUE: Gray-scale sonography with graded compression, as well as color Doppler and duplex ultrasound were performed to evaluate the right upper extremity deep venous system from the level of the subclavian vein and including the jugular, axillary, basilic, radial, ulnar and upper cephalic vein. Spectral Doppler was utilized to evaluate flow at rest and with distal augmentation maneuvers. COMPARISON:  None. FINDINGS: Contralateral Subclavian Vein: Respiratory phasicity is normal and symmetric with the symptomatic side. No evidence of thrombus. Normal compressibility. Internal Jugular Vein: No evidence of thrombus. Normal compressibility, respiratory phasicity and response to augmentation. Subclavian Vein: No evidence of thrombus. Normal compressibility, respiratory phasicity and response to augmentation. Axillary Vein: No evidence of thrombus. Normal compressibility, respiratory phasicity and response to augmentation. Cephalic Vein: No evidence of thrombus. Normal compressibility, respiratory phasicity and response to augmentation. Basilic Vein: No evidence of thrombus. Normal compressibility, respiratory phasicity and  response to augmentation. Brachial Veins: No evidence of thrombus. Normal compressibility, respiratory phasicity and response to augmentation. Radial Veins: No evidence of thrombus. Normal compressibility, respiratory phasicity and response to augmentation. Ulnar Veins: No evidence of thrombus. Normal compressibility, respiratory phasicity and response to augmentation. Venous Reflux:  None visualized. Other Findings: There is acute appearing thrombus in a venous structure along the ulnar aspect of the forearm, likely in a superficial vessel. No compression or augmentation in this vessel. IMPRESSION: No evidence of DVT within the right upper extremity. Focus of suspected  superficial thrombosis in a fairly superficial vein along the ulnar aspect of the forearm. These results will be called to the ordering clinician or representative by the Radiologist Assistant, and communication documented in the PACS or [employer]. Electronically Signed   By: Lowella Grip III M.D.   On: 01/23/2021 18:18    ASSESSMENT: Stage IV intrahepatic cholangiocarcinoma.  PLAN:    1.  Stage IV intrahepatic cholangiocarcinoma: Incidental finding on routine cholecystectomy. PET scan results from July 13, 2020 as well as MRI results from July 20, 2020 reviewed independently with right lobe liver lesion consistent with intrahepatic cholangiocarcinoma.  Biopsy confirmed the results.  Gynecologic work-up is essentially negative.  Colonoscopy on June 15, 2020 was essentially within normal limits.  Mammogram on July 19, 2020 was reported as BI-RADS 2. All patient tumor markers are negative.  Patient has now had port placement secondary to poor IV access.  Plan to give cisplatin and gemcitabine on day 1 with gemcitabine only on day 8.  This will be a 21-day cycle.  Because of a history of neutropenia, patient will require Ziextenzo after day 8 of treatment.  Repeat MRI on December 01, 2020 reviewed independently with overall improvement of disease burden, but likely residual malignancy.  Delay cycle 8, day 1 of treatment today.  Patient will instead receive blood transfusion.  Return to clinic in 1 week for further evaluation and reconsideration of cycle 8, day 1.  Plan to reimage at the end of June 2022. 2.  Neutropenia: Recurrent.  Continue Ziextenzo after day 8 of each cycle. 3.  Anemia: Hemoglobin worse today at 8.3 and she is symptomatic.  Proceed with 1 unit packed red blood cells and delay treatment 1 week as above. 4.   Flank pain: Patient does not complain of this today.  Likely secondary to malignancy.  Abdominal ultrasound was unrevealing.   5.  Elevated liver enzymes:  Resolved. 6.  Hyperglycemia: Chronic and unchanged. 7.  Hyponatremia: Resolved. 8.  Renal insufficiency: Resolved. 9.  Hypokalemia: Decreased, but improved.  Monitor. 10.  Leukocytosis: Patient does not have evidence of underlying infection, monitor.  Patient expressed understanding and was in agreement with this plan. She also understands that She can call clinic at any time with any questions, concerns, or complaints.    Cancer Staging Intrahepatic cholangiocarcinoma ([place]) Staging form: Intrahepatic Bile Duct, AJCC 8th Edition - Clinical stage from 08/04/2020: Stage IV (cT1b, cN0, pM1) - Signed by Lloyd Huger, MD on 08/04/2020   Lloyd Huger, MD   02/21/2021 3:05 PM

## 2021-02-21 ENCOUNTER — Encounter: Payer: Self-pay | Admitting: Oncology

## 2021-02-21 ENCOUNTER — Inpatient Hospital Stay: Payer: BC Managed Care – PPO

## 2021-02-21 ENCOUNTER — Inpatient Hospital Stay: Payer: BC Managed Care – PPO | Attending: Oncology

## 2021-02-21 ENCOUNTER — Other Ambulatory Visit: Payer: Self-pay

## 2021-02-21 ENCOUNTER — Inpatient Hospital Stay (HOSPITAL_BASED_OUTPATIENT_CLINIC_OR_DEPARTMENT_OTHER): Payer: BC Managed Care – PPO | Admitting: Oncology

## 2021-02-21 VITALS — BP 130/74 | HR 66 | Temp 96.0°F | Resp 17

## 2021-02-21 VITALS — BP 110/62 | HR 80 | Temp 99.1°F | Ht 65.0 in | Wt 155.6 lb

## 2021-02-21 DIAGNOSIS — D709 Neutropenia, unspecified: Secondary | ICD-10-CM | POA: Diagnosis not present

## 2021-02-21 DIAGNOSIS — Z801 Family history of malignant neoplasm of trachea, bronchus and lung: Secondary | ICD-10-CM | POA: Diagnosis not present

## 2021-02-21 DIAGNOSIS — C221 Intrahepatic bile duct carcinoma: Secondary | ICD-10-CM

## 2021-02-21 DIAGNOSIS — R112 Nausea with vomiting, unspecified: Secondary | ICD-10-CM | POA: Insufficient documentation

## 2021-02-21 DIAGNOSIS — R630 Anorexia: Secondary | ICD-10-CM | POA: Diagnosis not present

## 2021-02-21 DIAGNOSIS — R110 Nausea: Secondary | ICD-10-CM

## 2021-02-21 DIAGNOSIS — E1165 Type 2 diabetes mellitus with hyperglycemia: Secondary | ICD-10-CM | POA: Diagnosis not present

## 2021-02-21 DIAGNOSIS — M5136 Other intervertebral disc degeneration, lumbar region: Secondary | ICD-10-CM | POA: Insufficient documentation

## 2021-02-21 DIAGNOSIS — Z79899 Other long term (current) drug therapy: Secondary | ICD-10-CM | POA: Insufficient documentation

## 2021-02-21 DIAGNOSIS — D649 Anemia, unspecified: Secondary | ICD-10-CM | POA: Insufficient documentation

## 2021-02-21 LAB — CBC WITH DIFFERENTIAL/PLATELET
Abs Immature Granulocytes: 0.08 10*3/uL — ABNORMAL HIGH (ref 0.00–0.07)
Basophils Absolute: 0.1 10*3/uL (ref 0.0–0.1)
Basophils Relative: 1 %
Eosinophils Absolute: 0.1 10*3/uL (ref 0.0–0.5)
Eosinophils Relative: 0 %
HCT: 25.4 % — ABNORMAL LOW (ref 36.0–46.0)
Hemoglobin: 8.3 g/dL — ABNORMAL LOW (ref 12.0–15.0)
Immature Granulocytes: 1 %
Lymphocytes Relative: 8 %
Lymphs Abs: 1.1 10*3/uL (ref 0.7–4.0)
MCH: 29.2 pg (ref 26.0–34.0)
MCHC: 32.7 g/dL (ref 30.0–36.0)
MCV: 89.4 fL (ref 80.0–100.0)
Monocytes Absolute: 2 10*3/uL — ABNORMAL HIGH (ref 0.1–1.0)
Monocytes Relative: 15 %
Neutro Abs: 10.1 10*3/uL — ABNORMAL HIGH (ref 1.7–7.7)
Neutrophils Relative %: 75 %
Platelets: 494 10*3/uL — ABNORMAL HIGH (ref 150–400)
RBC: 2.84 MIL/uL — ABNORMAL LOW (ref 3.87–5.11)
RDW: 17 % — ABNORMAL HIGH (ref 11.5–15.5)
WBC: 13.4 10*3/uL — ABNORMAL HIGH (ref 4.0–10.5)
nRBC: 0 % (ref 0.0–0.2)

## 2021-02-21 LAB — COMPREHENSIVE METABOLIC PANEL
ALT: 19 U/L (ref 0–44)
AST: 33 U/L (ref 15–41)
Albumin: 3 g/dL — ABNORMAL LOW (ref 3.5–5.0)
Alkaline Phosphatase: 117 U/L (ref 38–126)
Anion gap: 11 (ref 5–15)
BUN: 14 mg/dL (ref 6–20)
CO2: 27 mmol/L (ref 22–32)
Calcium: 8.5 mg/dL — ABNORMAL LOW (ref 8.9–10.3)
Chloride: 99 mmol/L (ref 98–111)
Creatinine, Ser: 0.84 mg/dL (ref 0.44–1.00)
GFR, Estimated: >60 mL/min (ref >60)
Glucose, Bld: 185 mg/dL — ABNORMAL HIGH (ref 70–99)
Potassium: 3.3 mmol/L — ABNORMAL LOW (ref 3.5–5.1)
Sodium: 137 mmol/L (ref 135–145)
Total Bilirubin: 0.6 mg/dL (ref 0.3–1.2)
Total Protein: 7 g/dL (ref 6.5–8.1)

## 2021-02-21 LAB — ABO/RH: ABO/RH(D): A POS

## 2021-02-21 LAB — PREPARE RBC (CROSSMATCH)

## 2021-02-21 MED ORDER — DIPHENHYDRAMINE HCL 50 MG/ML IJ SOLN
25.0000 mg | Freq: Once | INTRAMUSCULAR | Status: AC
  Administered 2021-02-21: 25 mg via INTRAVENOUS
  Filled 2021-02-21: qty 1

## 2021-02-21 MED ORDER — SODIUM CHLORIDE 0.9 % IV SOLN
10.0000 mg | Freq: Once | INTRAVENOUS | Status: AC
  Administered 2021-02-21: 10 mg via INTRAVENOUS
  Filled 2021-02-21: qty 10

## 2021-02-21 MED ORDER — ACETAMINOPHEN 325 MG PO TABS
650.0000 mg | ORAL_TABLET | Freq: Once | ORAL | Status: AC
  Administered 2021-02-21: 650 mg via ORAL
  Filled 2021-02-21: qty 2

## 2021-02-21 MED ORDER — HEPARIN SOD (PORK) LOCK FLUSH 100 UNIT/ML IV SOLN
500.0000 [IU] | Freq: Every day | INTRAVENOUS | Status: AC | PRN
Start: 2021-02-21 — End: 2021-02-21
  Administered 2021-02-21: 500 [IU]
  Filled 2021-02-21: qty 5

## 2021-02-21 MED ORDER — HEPARIN SOD (PORK) LOCK FLUSH 100 UNIT/ML IV SOLN
INTRAVENOUS | Status: AC
  Filled 2021-02-21: qty 5

## 2021-02-21 MED ORDER — SODIUM CHLORIDE 0.9 % IV SOLN: Freq: Once | INTRAVENOUS | Status: DC

## 2021-02-21 MED ORDER — ONDANSETRON HCL 4 MG/2ML IJ SOLN
8.0000 mg | Freq: Once | INTRAMUSCULAR | Status: AC
  Administered 2021-02-21: 8 mg via INTRAVENOUS
  Filled 2021-02-21: qty 4

## 2021-02-21 MED ORDER — SODIUM CHLORIDE 0.9% IV SOLUTION
250.0000 mL | Freq: Once | INTRAVENOUS | Status: AC
Start: 2021-02-21 — End: 2021-02-21
  Administered 2021-02-21: 250 mL via INTRAVENOUS
  Filled 2021-02-21: qty 250

## 2021-02-21 NOTE — Progress Notes (Signed)
Pt tolerated blood transfusion well with no signs of complications. RN educated pt on the importance of notifying the clinic if any complications occur at home and when to seek emergency care, pt verbalized understanding and all questions answered at this time. Pt stable for discharge, VSS.  Lauren Lindsey [employer]

## 2021-02-21 NOTE — Patient Instructions (Signed)
[facility] .  Discharge Instructions: Thank you for choosing [facility] to provide your oncology and hematology care.  If you have a lab appointment with the [facility], please go directly to the [facility] and check in at the registration area.  Wear comfortable clothing and clothing appropriate for easy access to any Portacath or PICC line.   We strive to give you quality time with your provider. You may need to reschedule your appointment if you arrive late (15 or more minutes).  Arriving late affects you and other patients whose appointments are after yours.  Also, if you miss three or more appointments without notifying the office, you may be dismissed from the clinic at the provider's discretion.      For prescription refill requests, have your pharmacy contact our office and allow 72 hours for refills to be completed.    Today you received a blood product  Blood Transfusion, Adult, Care After This sheet gives you information about how to care for yourself after your procedure. Your doctor may also give you more specific instructions. If you have problems or questions, contact your doctor. What can I expect after the procedure? After the procedure, it is common to have:  Bruising and soreness at the IV site.  A fever or chills on the day of the procedure. This may be your body's response to the new blood cells received.  A headache. Follow these instructions at home: Insertion site care  Follow instructions from your doctor about how to take care of your insertion site. This is where an IV tube was put into your vein. Make sure you: ? Wash your hands with soap and water before and after you change your bandage (dressing). If you cannot use soap and water, use hand sanitizer. ? Change your bandage as told by your doctor.  Check your insertion site every day for signs of infection. Check for: ? Redness, swelling, or  pain. ? Bleeding from the site. ? Warmth. ? Pus or a bad smell.      General instructions  Take over-the-counter and prescription medicines only as told by your doctor.  Rest as told by your doctor.  Go back to your normal activities as told by your doctor.  Keep all follow-up visits as told by your doctor. This is important. Contact a doctor if:  You have itching or red, swollen areas of skin (hives).  You feel worried or nervous (anxious).  You feel weak after doing your normal activities.  You have redness, swelling, warmth, or pain around the insertion site.  You have blood coming from the insertion site, and the blood does not stop with pressure.  You have pus or a bad smell coming from the insertion site. Get help right away if:  You have signs of a serious reaction. This may be coming from an allergy or the body's defense system (immune system). Signs include: ? Trouble breathing or shortness of breath. ? Swelling of the face or feeling warm (flushed). ? Fever or chills. ? Head, chest, or back pain. ? Dark pee (urine) or blood in the pee. ? Widespread rash. ? Fast heartbeat. ? Feeling dizzy or light-headed. You may receive your blood transfusion in an outpatient setting. If so, you will be told whom to contact to report any reactions. These symptoms may be an emergency. Do not wait to see if the symptoms will go away. Get medical help right away. Call your  local emergency services (911 in the [place] Do not drive yourself to the hospital. Summary  Bruising and soreness at the IV site are common.  Check your insertion site every day for signs of infection.  Rest as told by your doctor. Go back to your normal activities as told by your doctor.  Get help right away if you have signs of a serious reaction. This information is not intended to replace advice given to you by your health care provider. Make sure you discuss any questions you have with your health care  provider. Document Revised: 03/04/2019 Document Reviewed: 03/04/2019 Elsevier Patient Education  2021 [place].    To help prevent nausea and vomiting after your treatment, we encourage you to take your nausea medication as directed.  BELOW ARE SYMPTOMS THAT SHOULD BE REPORTED IMMEDIATELY: . *FEVER GREATER THAN 100.4 F (38 C) OR HIGHER . *CHILLS OR SWEATING . *NAUSEA AND VOMITING THAT IS NOT CONTROLLED WITH YOUR NAUSEA MEDICATION . *UNUSUAL SHORTNESS OF BREATH . *UNUSUAL BRUISING OR BLEEDING . *URINARY PROBLEMS (pain or burning when urinating, or frequent urination) . *BOWEL PROBLEMS (unusual diarrhea, constipation, pain near the anus) . TENDERNESS IN MOUTH AND THROAT WITH OR WITHOUT PRESENCE OF ULCERS (sore throat, sores in mouth, or a toothache) . UNUSUAL RASH, SWELLING OR PAIN  . UNUSUAL VAGINAL DISCHARGE OR ITCHING   Items with * indicate a potential emergency and should be followed up as soon as possible or go to the Emergency Department if any problems should occur.  Please show the CHEMOTHERAPY ALERT CARD or IMMUNOTHERAPY ALERT CARD at check-in to the Emergency Department and triage nurse.  Should you have questions after your visit or need to cancel or reschedule your appointment, please contact [facility]  [phone] and follow the prompts.  Office hours are 8:00 a.m. to 4:30 p.m. Monday - Friday. Please note that voicemails left after 4:00 p.m. may not be returned until the following business day.  We are closed weekends and major holidays. You have access to a nurse at all times for urgent questions. Please call the main number to the clinic [phone] and follow the prompts.  For any non-urgent questions, you may also contact your provider using MyChart. We now offer e-Visits for anyone 67 and older to request care online for non-urgent symptoms. For details visit [email or web address].   Also download the MyChart app! Go to the  app store, search "MyChart", open the app, select [facility], and log in with your MyChart username and password.  Due to Covid, a mask is required upon entering the hospital/clinic. If you do not have a mask, one will be given to you upon arrival. For doctor visits, patients may have 1 support person aged 54 or older with them. For treatment visits, patients cannot have anyone with them due to current Covid guidelines and our immunocompromised population.

## 2021-02-21 NOTE — Progress Notes (Signed)
Pt is feeling very weak and her walking gait was off . She got sick this morning and for the last 2 weeks been sick and her choice of foods have changed .

## 2021-02-22 LAB — TYPE AND SCREEN
ABO/RH(D): A POS
Antibody Screen: NEGATIVE
Unit division: 0

## 2021-02-22 LAB — BPAM RBC
Blood Product Expiration Date: 202206252359
ISSUE DATE / TIME: 202206011231
Unit Type and Rh: 6200

## 2021-02-23 ENCOUNTER — Telehealth: Payer: Self-pay | Admitting: *Deleted

## 2021-02-23 NOTE — Telephone Encounter (Signed)
Phone patient and husband for more information. Per husband, temp is running about 101 farenheit. States that she first noticed it this morning and has not taken any tylenol. States that she doesn't feel like doing much. Denies nausea, vomiting, diarrhea, cough, congestion, sore throat, or any urinary symptoms. Able to tolerate PO. Encouraged fluid intake and to try tylenol, and also to obtain home covid test. Advised if test is positive, to remain isolated and continue supportive care. If COVID test is negative, and if no improvement by tomorrow and still running fever, informed them that they should call on-call MD or go to the ED.

## 2021-02-23 NOTE — Progress Notes (Signed)
[facility]  [phone] [phone]  ID: Lauren Lindsey OB: Jan 04, 1961  MR#: [id number]  EUM#:[id number]  Patient Care Team: [name], MD as PCP - General (Family Medicine) Patient, No Pcp Per (Inactive) (General Practice) [name], RN as Oncology Nurse Navigator [name], [name], MD as Consulting Physician (Oncology)   CHIEF COMPLAINT: Stage IV intrahepatic cholangiocarcinoma.  INTERVAL HISTORY: Patient returns to clinic today for further evaluation and reconsideration of cycle 8, day 1 of cisplatin and gemcitabine.  Patient states she felt well for 1 day after receiving 1 unit of packed red blood cells last week, but then the remainder of the week she has felt increasingly weak and fatigued.  She has a poor appetite and nausea.  She also had fever greater than 101, which is since resolved.  She does not complain of pain today. She has no neurologic complaints.  She has no chest pain, shortness of breath, cough, or hemoptysis. She has no melena or hematochezia.  She has no urinary complaints.  Patient continues to feel generally terrible, but offers no further specific complaints today.  REVIEW OF SYSTEMS:   Review of Systems  Constitutional: Positive for fever and malaise/fatigue. Negative for weight loss.  HENT: Negative.  Negative for congestion and sinus pain.   Respiratory: Negative.  Negative for cough, hemoptysis and shortness of breath.   Cardiovascular: Negative.  Negative for chest pain and leg swelling.  Gastrointestinal: Positive for nausea and vomiting. Negative for abdominal pain, blood in stool, constipation, diarrhea and melena.  Genitourinary: Negative.  Negative for dysuria and flank pain.  Musculoskeletal: Negative.  Negative for back pain.  Skin: Negative.  Negative for rash.  Neurological: Positive for weakness. Negative for dizziness, focal weakness and headaches.  Psychiatric/Behavioral: Negative.  The  patient is not nervous/anxious.     As per HPI. Otherwise, a complete review of systems is negative.  PAST MEDICAL HISTORY: Past Medical History:  Diagnosis Date  . Abnormal Pap smear of cervix   . Anemia    h/o  . Diabetes mellitus without complication ([place])   . Intrahepatic cholangiocarcinoma ([place])     PAST SURGICAL HISTORY: Past Surgical History:  Procedure Laterality Date  . CHOLECYSTECTOMY    . COLONOSCOPY WITH PROPOFOL N/A 06/15/2020   Procedure: COLONOSCOPY WITH PROPOFOL;  Surgeon: Lin Landsman, MD;  Location: [facility];  Service: Endoscopy;  Laterality: N/A;  Diabetic - oral meds  . ENDOMETRIAL ABLATION    . HYSTEROSCOPY WITH D & C    . LEEP    . PORTA CATH INSERTION N/A 01/04/2021   Procedure: PORTA CATH INSERTION;  Surgeon: Algernon Huxley, MD;  Location: [facility];  Service: Cardiovascular;  Laterality: N/A;    FAMILY HISTORY: Family History  Problem Relation Age of Onset  . Bleeding Disorder Mother   . Biliary Cirrhosis Mother   . Liver cancer Mother   . Hypertension Mother   . Diabetes Father   . Lung cancer Father   . Breast cancer Neg Hx     ADVANCED DIRECTIVES (Y/N):  N  HEALTH MAINTENANCE: Social History   Tobacco Use  . Smoking status: Never Smoker  . Smokeless tobacco: Never Used  Vaping Use  . Vaping Use: Never used  Substance Use Topics  . Alcohol use: No  . Drug use: No     Colonoscopy:  PAP:  Bone density:  Lipid panel:  No Known Allergies  Current Outpatient Medications  Medication  Sig Dispense Refill  . acetaminophen (TYLENOL) 500 MG tablet Take 500 mg by mouth every 6 (six) hours as needed.    Marland Kitchen levofloxacin (LEVAQUIN) 500 MG tablet Take 1 tablet (500 mg total) by mouth daily. 7 tablet 0  . lidocaine-prilocaine (EMLA) cream Apply 1 application topically as needed. 30 g 2  . metFORMIN (GLUCOPHAGE) 500 MG tablet Take 500 mg by mouth 2 (two) times daily.    . ondansetron (ZOFRAN) 8 MG tablet Take 1  tablet (8 mg total) by mouth every 8 (eight) hours as needed for nausea or vomiting. 30 tablet 2  . sulfamethoxazole-trimethoprim (BACTRIM DS) 800-160 MG tablet Take 1 tablet by mouth 2 (two) times daily. 20 tablet 0  . levofloxacin (LEVAQUIN) 500 MG tablet Take 1 tablet (500 mg total) by mouth daily. (Patient not taking: No sig reported) 5 tablet 0   No current facility-administered medications for this visit.   Facility-Administered Medications Ordered in Other Visits  Medication Dose Route Frequency Provider Last Rate Last Admin  . 0.9 %  sodium chloride infusion   Intravenous Once Lloyd Huger, MD      . heparin lock flush 100 unit/mL  500 Units Intravenous Once Lloyd Huger, MD      . sodium chloride flush (NS) 0.9 % injection 10 mL  10 mL Intravenous PRN Lloyd Huger, MD   10 mL at 01/17/21 0854    OBJECTIVE: Vitals:   02/28/21 0905  BP: (!) 133/98  Pulse: 95  Temp: 99.9 F (37.7 C)  SpO2: 98%     Body mass index is 25.61 kg/m.    ECOG FS:1 - Symptomatic but completely ambulatory   General: Well-developed, well-nourished, no acute distress. Eyes: Pink conjunctiva, anicteric sclera. HEENT: Normocephalic, moist mucous membranes. Lungs: No audible wheezing or coughing. Heart: Regular rate and rhythm. Abdomen: Soft, nontender, no obvious distention. Musculoskeletal: No edema, cyanosis, or clubbing. Neuro: Alert, answering all questions appropriately. Cranial nerves grossly intact. Skin: No rashes or petechiae noted. Psych: Normal affect.  LAB RESULTS:  Lab Results  Component Value Date   NA 133 (L) 02/28/2021   K 3.9 02/28/2021   CL 98 02/28/2021   CO2 24 02/28/2021   GLUCOSE 226 (H) 02/28/2021   BUN 14 02/28/2021   CREATININE 0.96 02/28/2021   CALCIUM 8.5 (L) 02/28/2021   PROT 7.8 02/28/2021   ALBUMIN 2.9 (L) 02/28/2021   AST 33 02/28/2021   ALT 21 02/28/2021   ALKPHOS 112 02/28/2021   BILITOT 0.6 02/28/2021   GFRNONAA >60 02/28/2021    GFRAA >60 05/23/2020    Lab Results  Component Value Date   WBC 16.8 (H) 02/28/2021   NEUTROABS 13.4 (H) 02/28/2021   HGB 9.9 (L) 02/28/2021   HCT 30.3 (L) 02/28/2021   MCV 88.6 02/28/2021   PLT 565 (H) 02/28/2021     STUDIES: No results found.  ASSESSMENT: Stage IV intrahepatic cholangiocarcinoma.  PLAN:    1.  Stage IV intrahepatic cholangiocarcinoma: Incidental finding on routine cholecystectomy. PET scan results from July 13, 2020 as well as MRI results from July 20, 2020 reviewed independently with right lobe liver lesion consistent with intrahepatic cholangiocarcinoma.  Biopsy confirmed the results.  Gynecologic work-up is essentially negative.  Colonoscopy on June 15, 2020 was essentially within normal limits.  Mammogram on July 19, 2020 was reported as BI-RADS 2. All patient tumor markers are negative.  Patient has now had port placement secondary to poor IV access.  Plan to give cisplatin and  gemcitabine on day 1 with gemcitabine only on day 8.  This will be a 21-day cycle.  Because of a history of neutropenia, patient will require Ziextenzo after day 8 of treatment.  Repeat MRI on December 01, 2020 reviewed independently with overall improvement of disease burden, but likely residual malignancy.  Given patient's persistent symptoms, we will delay treatment once again and repeat MRI of the abdomen within the next week.  Return to clinic in 1 week discussed the imaging results and treatment if patient's performance status improves.   2.  Neutropenia: Resolved.  Patient now has leukocytosis.  Continue Ziextenzo after day 8 of each cycle. 3.  Anemia: Improved to 9.9 with 1 unit of packed red blood cells last week.  Monitor.   4.   Flank pain: Patient does not complain of this today.  Likely secondary to malignancy.  Abdominal ultrasound was unrevealing.   5.  Elevated liver enzymes: Resolved. 6.  Hyperglycemia: Blood glucose significantly worse today at 226.  Patient also  noted to have greater than 500 glucose in her urine.  Consider referral back to primary care for tighter blood glucose control. 7.  Hyponatremia: Mild, monitor. 8.  Renal insufficiency: Resolved. 9.  Hypokalemia: Resolved. 10.  Leukocytosis: UA negative for infection, but will prophylactically place patient on Levaquin 500 mg daily x7 days. 11.  Fevers: None currently.  Proceed with 1 L of IV fluids today.  Patient expressed understanding and was in agreement with this plan. She also understands that She can call clinic at any time with any questions, concerns, or complaints.    Cancer Staging Intrahepatic cholangiocarcinoma ([place]) Staging form: Intrahepatic Bile Duct, AJCC 8th Edition - Clinical stage from 08/04/2020: Stage IV (cT1b, cN0, pM1) - Signed by Lloyd Huger, MD on 08/04/2020   Lloyd Huger, MD   02/28/2021 1:39 PM

## 2021-02-23 NOTE — Telephone Encounter (Signed)
Patient husband called reporting that patient is running low grade fever and is asking if she should just take tylenol or what to do

## 2021-02-28 ENCOUNTER — Inpatient Hospital Stay: Payer: BC Managed Care – PPO | Admitting: Oncology

## 2021-02-28 ENCOUNTER — Encounter: Payer: Self-pay | Admitting: Oncology

## 2021-02-28 ENCOUNTER — Inpatient Hospital Stay: Payer: BC Managed Care – PPO

## 2021-02-28 ENCOUNTER — Telehealth: Payer: Self-pay | Admitting: Oncology

## 2021-02-28 VITALS — BP 133/98 | HR 95 | Temp 99.9°F | Wt 153.9 lb

## 2021-02-28 DIAGNOSIS — C221 Intrahepatic bile duct carcinoma: Secondary | ICD-10-CM | POA: Diagnosis not present

## 2021-02-28 LAB — CBC WITH DIFFERENTIAL/PLATELET
Abs Immature Granulocytes: 0.14 10*3/uL — ABNORMAL HIGH (ref 0.00–0.07)
Basophils Absolute: 0.1 10*3/uL (ref 0.0–0.1)
Basophils Relative: 1 %
Eosinophils Absolute: 0.1 10*3/uL (ref 0.0–0.5)
Eosinophils Relative: 0 %
HCT: 30.3 % — ABNORMAL LOW (ref 36.0–46.0)
Hemoglobin: 9.9 g/dL — ABNORMAL LOW (ref 12.0–15.0)
Immature Granulocytes: 1 %
Lymphocytes Relative: 9 %
Lymphs Abs: 1.4 10*3/uL (ref 0.7–4.0)
MCH: 28.9 pg (ref 26.0–34.0)
MCHC: 32.7 g/dL (ref 30.0–36.0)
MCV: 88.6 fL (ref 80.0–100.0)
Monocytes Absolute: 1.8 10*3/uL — ABNORMAL HIGH (ref 0.1–1.0)
Monocytes Relative: 10 %
Neutro Abs: 13.4 10*3/uL — ABNORMAL HIGH (ref 1.7–7.7)
Neutrophils Relative %: 79 %
Platelets: 565 10*3/uL — ABNORMAL HIGH (ref 150–400)
RBC: 3.42 MIL/uL — ABNORMAL LOW (ref 3.87–5.11)
RDW: 16 % — ABNORMAL HIGH (ref 11.5–15.5)
WBC: 16.8 10*3/uL — ABNORMAL HIGH (ref 4.0–10.5)
nRBC: 0 % (ref 0.0–0.2)

## 2021-02-28 LAB — COMPREHENSIVE METABOLIC PANEL
ALT: 21 U/L (ref 0–44)
AST: 33 U/L (ref 15–41)
Albumin: 2.9 g/dL — ABNORMAL LOW (ref 3.5–5.0)
Alkaline Phosphatase: 112 U/L (ref 38–126)
Anion gap: 11 (ref 5–15)
BUN: 14 mg/dL (ref 6–20)
CO2: 24 mmol/L (ref 22–32)
Calcium: 8.5 mg/dL — ABNORMAL LOW (ref 8.9–10.3)
Chloride: 98 mmol/L (ref 98–111)
Creatinine, Ser: 0.96 mg/dL (ref 0.44–1.00)
GFR, Estimated: >60 mL/min (ref >60)
Glucose, Bld: 226 mg/dL — ABNORMAL HIGH (ref 70–99)
Potassium: 3.9 mmol/L (ref 3.5–5.1)
Sodium: 133 mmol/L — ABNORMAL LOW (ref 135–145)
Total Bilirubin: 0.6 mg/dL (ref 0.3–1.2)
Total Protein: 7.8 g/dL (ref 6.5–8.1)

## 2021-02-28 LAB — URINALYSIS, COMPLETE (UACMP) WITH MICROSCOPIC
Bacteria, UA: NONE SEEN
Bilirubin Urine: NEGATIVE
Glucose, UA: >=500 mg/dL — AB
Hgb urine dipstick: NEGATIVE
Ketones, ur: NEGATIVE mg/dL
Leukocytes,Ua: NEGATIVE
Nitrite: NEGATIVE
Protein, ur: NEGATIVE mg/dL
Specific Gravity, Urine: 1.01 (ref 1.005–1.030)
pH: 6 (ref 5.0–8.0)

## 2021-02-28 MED ORDER — HEPARIN SOD (PORK) LOCK FLUSH 100 UNIT/ML IV SOLN
INTRAVENOUS | Status: AC
  Filled 2021-02-28: qty 5

## 2021-02-28 MED ORDER — LEVOFLOXACIN 500 MG PO TABS: 500.0000 mg | ORAL_TABLET | Freq: Every day | ORAL | 0 refills | Status: AC

## 2021-02-28 MED ORDER — POTASSIUM CHLORIDE IN NACL 20-0.9 MEQ/L-% IV SOLN
Freq: Once | INTRAVENOUS | Status: DC
  Filled 2021-02-28: qty 1000

## 2021-02-28 MED ORDER — SODIUM CHLORIDE 0.9 % IV SOLN: 1800.0000 mg | Freq: Once | INTRAVENOUS | Status: DC

## 2021-02-28 MED ORDER — SODIUM CHLORIDE 0.9 % IV SOLN: 10.0000 mg | Freq: Once | INTRAVENOUS | Status: DC

## 2021-02-28 MED ORDER — SODIUM CHLORIDE 0.9 % IV SOLN
Freq: Once | INTRAVENOUS | Status: DC
  Filled 2021-02-28: qty 250

## 2021-02-28 MED ORDER — HEPARIN SOD (PORK) LOCK FLUSH 100 UNIT/ML IV SOLN
500.0000 [IU] | Freq: Once | INTRAVENOUS | Status: AC | PRN
  Administered 2021-02-28: 500 [IU]
  Filled 2021-02-28: qty 5

## 2021-02-28 MED ORDER — SODIUM CHLORIDE 0.9 % IV SOLN
Freq: Once | INTRAVENOUS | Status: AC
Start: 2021-02-28 — End: 2021-02-28
  Filled 2021-02-28: qty 250

## 2021-02-28 MED ORDER — MAGNESIUM SULFATE 2 GM/50ML IV SOLN: 2.0000 g | Freq: Once | INTRAVENOUS | Status: DC

## 2021-02-28 MED ORDER — SODIUM CHLORIDE 0.9 % IV SOLN: 150.0000 mg | Freq: Once | INTRAVENOUS | Status: DC

## 2021-02-28 MED ORDER — PALONOSETRON HCL INJECTION 0.25 MG/5ML: 0.2500 mg | Freq: Once | INTRAVENOUS | Status: DC

## 2021-02-28 MED ORDER — CISPLATIN CHEMO INJECTION 100MG/100ML: 80.0000 mg/m2 | Freq: Once | INTRAVENOUS | Status: DC

## 2021-02-28 NOTE — Telephone Encounter (Signed)
Spoke with patient's husband to notify him of STAT MRI scheduled for tomorrow at 5pm. He confirmed and would taking patient.

## 2021-02-28 NOTE — Patient Instructions (Signed)
Dehydration, Adult Dehydration is condition in which there is not enough water or other fluids in the body. This happens when a person loses more fluids than he or she takes in. Important body parts cannot work right without the right amount of fluids. Any loss of fluids from the body can cause dehydration. Dehydration can be mild, worse, or very bad. It should be treated right away to keep it from getting very bad. What are the causes? This condition may be caused by:  Conditions that cause loss of water or other fluids, such as: ? Watery poop (diarrhea). ? Vomiting. ? Sweating a lot. ? Peeing (urinating) a lot.  Not drinking enough fluids, especially when you: ? Are ill. ? Are doing things that take a lot of energy to do.  Other illnesses and conditions, such as fever or infection.  Certain medicines, such as medicines that take extra fluid out of the body (diuretics).  Lack of safe drinking water.  Not being able to get enough water and food. What increases the risk? The following factors may make you more likely to develop this condition:  Having a long-term (chronic) illness that has not been treated the right way, such as: ? Diabetes. ? Heart disease. ? Kidney disease.  Being 65 years of age or older.  Having a disability.  Living in a place that is high above the ground or sea (high in altitude). The thinner, dried air causes more fluid loss.  Doing exercises that put stress on your body for a long time. What are the signs or symptoms? Symptoms of dehydration depend on how bad it is. Mild or worse dehydration  Thirst.  Dry lips or dry mouth.  Feeling dizzy or light-headed, especially when you stand up from sitting.  Muscle cramps.  Your body making: ? Dark pee (urine). Pee may be the color of tea. ? Less pee than normal. ? Less tears than normal.  Headache. Very bad dehydration  Changes in skin. Skin may: ? Be cold to the touch (clammy). ? Be blotchy  or pale. ? Not go back to normal right after you lightly pinch it and let it go.  Little or no tears, pee, or sweat.  Changes in vital signs, such as: ? Fast breathing. ? Low blood pressure. ? Weak pulse. ? Pulse that is more than 100 beats a minute when you are sitting still.  Other changes, such as: ? Feeling very thirsty. ? Eyes that look hollow (sunken). ? Cold hands and feet. ? Being mixed up (confused). ? Being very tired (lethargic) or having trouble waking from sleep. ? Short-term weight loss. ? Loss of consciousness. How is this treated? Treatment for this condition depends on how bad it is. Treatment should start right away. Do not wait until your condition gets very bad. Very bad dehydration is an emergency. You will need to go to a hospital.  Mild or worse dehydration can be treated at home. You may be asked to: ? Drink more fluids. ? Drink an oral rehydration solution (ORS). This drink helps get the right amounts of fluids and salts and minerals in the blood (electrolytes).  Very bad dehydration can be treated: ? With fluids through an IV tube. ? By getting normal levels of salts and minerals in your blood. This is often done by giving salts and minerals through a tube. The tube is passed through your nose and into your stomach. ? By treating the root cause. Follow these instructions at   home: Oral rehydration solution If told by your doctor, drink an ORS:  Make an ORS. Use instructions on the package.  Start by drinking small amounts, about  cup (120 mL) every 5-10 minutes.  Slowly drink more until you have had the amount that your doctor said to have. Eating and drinking  Drink enough clear fluid to keep your pee pale yellow. If you were told to drink an ORS, finish the ORS first. Then, start slowly drinking other clear fluids. Drink fluids such as: ? Water. Do not drink only water. Doing that can make the salt (sodium) level in your body get too low. ? Water  from ice chips you suck on. ? Fruit juice that you have added water to (diluted). ? Low-calorie sports drinks.  Eat foods that have the right amounts of salts and minerals, such as: ? Bananas. ? Oranges. ? Potatoes. ? Tomatoes. ? Spinach.  Do not drink alcohol.  Avoid: ? Drinks that have a lot of sugar. These include:  High-calorie sports drinks.  Fruit juice that you did not add water to.  Soda.  Caffeine. ? Foods that are greasy or have a lot of fat or sugar.         General instructions  Take over-the-counter and prescription medicines only as told by your doctor.  Do not take salt tablets. Doing that can make the salt level in your body get too high.  Return to your normal activities as told by your doctor. Ask your doctor what activities are safe for you.  Keep all follow-up visits as told by your doctor. This is important. Contact a doctor if:  You have pain in your belly (abdomen) and the pain: ? Gets worse. ? Stays in one place.  You have a rash.  You have a stiff neck.  You get angry or annoyed (irritable) more easily than normal.  You are more tired or have a harder time waking than normal.  You feel: ? Weak or dizzy. ? Very thirsty. Get help right away if you have:  Any symptoms of very bad dehydration.  Symptoms of vomiting, such as: ? You cannot eat or drink without vomiting. ? Your vomiting gets worse or does not go away. ? Your vomit has blood or green stuff in it.  Symptoms that get worse with treatment.  A fever.  A very bad headache.  Problems with peeing or pooping (having a bowel movement), such as: ? Watery poop that gets worse or does not go away. ? Blood in your poop (stool). This may cause poop to look black and tarry. ? Not peeing in 6-8 hours. ? Peeing only a small amount of very dark pee in 6-8 hours.  Trouble breathing. These symptoms may be an emergency. Do not wait to see if the symptoms will go away. Get  medical help right away. Call your local emergency services (911 in the [place] Do not drive yourself to the hospital. Summary  Dehydration is a condition in which there is not enough water or other fluids in the body. This happens when a person loses more fluids than he or she takes in.  Treatment for this condition depends on how bad it is. Treatment should be started right away. Do not wait until your condition gets very bad.  Drink enough clear fluid to keep your pee pale yellow. If you were told to drink an oral rehydration solution (ORS), finish the ORS first. Then, start slowly drinking other clear fluids.    Take over-the-counter and prescription medicines only as told by your doctor.  Get help right away if you have any symptoms of very bad dehydration. This information is not intended to replace advice given to you by your health care provider. Make sure you discuss any questions you have with your health care provider. Document Revised: 04/22/2019 Document Reviewed: 04/22/2019 Elsevier Patient Education  2021 Elsevier Inc.  

## 2021-03-01 ENCOUNTER — Emergency Department: Payer: BC Managed Care – PPO

## 2021-03-01 ENCOUNTER — Telehealth: Payer: Self-pay | Admitting: *Deleted

## 2021-03-01 ENCOUNTER — Other Ambulatory Visit: Payer: Self-pay

## 2021-03-01 ENCOUNTER — Encounter: Payer: Self-pay | Admitting: Intensive Care

## 2021-03-01 ENCOUNTER — Ambulatory Visit: Payer: BC Managed Care – PPO

## 2021-03-01 ENCOUNTER — Inpatient Hospital Stay
Admission: EM | Admit: 2021-03-01 | Discharge: 2021-03-04 | DRG: 436 | Disposition: A | Discharge status: Home / Self Care | Payer: BC Managed Care – PPO | Attending: Internal Medicine | Admitting: Internal Medicine

## 2021-03-01 DIAGNOSIS — Z20822 Contact with and (suspected) exposure to covid-19: Secondary | ICD-10-CM | POA: Diagnosis present

## 2021-03-01 DIAGNOSIS — Z95828 Presence of other vascular implants and grafts: Secondary | ICD-10-CM | POA: Diagnosis not present

## 2021-03-01 DIAGNOSIS — Z9221 Personal history of antineoplastic chemotherapy: Secondary | ICD-10-CM | POA: Diagnosis not present

## 2021-03-01 DIAGNOSIS — T451X5A Adverse effect of antineoplastic and immunosuppressive drugs, initial encounter: Secondary | ICD-10-CM | POA: Diagnosis present

## 2021-03-01 DIAGNOSIS — A419 Sepsis, unspecified organism: Secondary | ICD-10-CM | POA: Diagnosis present

## 2021-03-01 DIAGNOSIS — D75839 Thrombocytosis, unspecified: Secondary | ICD-10-CM | POA: Diagnosis present

## 2021-03-01 DIAGNOSIS — Z7984 Long term (current) use of oral hypoglycemic drugs: Secondary | ICD-10-CM

## 2021-03-01 DIAGNOSIS — C221 Intrahepatic bile duct carcinoma: Secondary | ICD-10-CM | POA: Diagnosis present

## 2021-03-01 DIAGNOSIS — E871 Hypo-osmolality and hyponatremia: Secondary | ICD-10-CM | POA: Diagnosis present

## 2021-03-01 DIAGNOSIS — R6510 Systemic inflammatory response syndrome (SIRS) of non-infectious origin without acute organ dysfunction: Secondary | ICD-10-CM | POA: Diagnosis present

## 2021-03-01 DIAGNOSIS — D6481 Anemia due to antineoplastic chemotherapy: Secondary | ICD-10-CM | POA: Diagnosis present

## 2021-03-01 DIAGNOSIS — E876 Hypokalemia: Secondary | ICD-10-CM | POA: Diagnosis present

## 2021-03-01 DIAGNOSIS — R0682 Tachypnea, not elsewhere classified: Secondary | ICD-10-CM | POA: Diagnosis present

## 2021-03-01 DIAGNOSIS — Z801 Family history of malignant neoplasm of trachea, bronchus and lung: Secondary | ICD-10-CM | POA: Diagnosis not present

## 2021-03-01 DIAGNOSIS — Z832 Family history of diseases of the blood and blood-forming organs and certain disorders involving the immune mechanism: Secondary | ICD-10-CM

## 2021-03-01 DIAGNOSIS — Z8249 Family history of ischemic heart disease and other diseases of the circulatory system: Secondary | ICD-10-CM | POA: Diagnosis not present

## 2021-03-01 DIAGNOSIS — Z79899 Other long term (current) drug therapy: Secondary | ICD-10-CM | POA: Diagnosis not present

## 2021-03-01 DIAGNOSIS — E860 Dehydration: Secondary | ICD-10-CM | POA: Diagnosis present

## 2021-03-01 DIAGNOSIS — Z833 Family history of diabetes mellitus: Secondary | ICD-10-CM | POA: Diagnosis not present

## 2021-03-01 DIAGNOSIS — E119 Type 2 diabetes mellitus without complications: Secondary | ICD-10-CM

## 2021-03-01 DIAGNOSIS — R000 Tachycardia, unspecified: Secondary | ICD-10-CM | POA: Diagnosis present

## 2021-03-01 DIAGNOSIS — Z792 Long term (current) use of antibiotics: Secondary | ICD-10-CM | POA: Diagnosis not present

## 2021-03-01 DIAGNOSIS — Z800 Family history of malignant neoplasm of digestive organs: Secondary | ICD-10-CM

## 2021-03-01 DIAGNOSIS — R112 Nausea with vomiting, unspecified: Secondary | ICD-10-CM

## 2021-03-01 DIAGNOSIS — K2900 Acute gastritis without bleeding: Secondary | ICD-10-CM | POA: Diagnosis present

## 2021-03-01 DIAGNOSIS — R509 Fever, unspecified: Secondary | ICD-10-CM | POA: Diagnosis present

## 2021-03-01 DIAGNOSIS — E872 Acidosis: Secondary | ICD-10-CM | POA: Diagnosis present

## 2021-03-01 DIAGNOSIS — R52 Pain, unspecified: Secondary | ICD-10-CM

## 2021-03-01 LAB — URINALYSIS, COMPLETE (UACMP) WITH MICROSCOPIC
Bilirubin Urine: NEGATIVE
Glucose, UA: >=500 mg/dL — AB
Hgb urine dipstick: NEGATIVE
Ketones, ur: NEGATIVE mg/dL
Leukocytes,Ua: NEGATIVE
Nitrite: NEGATIVE
Protein, ur: NEGATIVE mg/dL
Specific Gravity, Urine: 1.013 (ref 1.005–1.030)
pH: 6 (ref 5.0–8.0)

## 2021-03-01 LAB — CBC WITH DIFFERENTIAL/PLATELET
Abs Immature Granulocytes: 0.16 10*3/uL — ABNORMAL HIGH (ref 0.00–0.07)
Basophils Absolute: 0.1 10*3/uL (ref 0.0–0.1)
Basophils Relative: 0 %
Eosinophils Absolute: 0 10*3/uL (ref 0.0–0.5)
Eosinophils Relative: 0 %
HCT: 28.2 % — ABNORMAL LOW (ref 36.0–46.0)
Hemoglobin: 9.2 g/dL — ABNORMAL LOW (ref 12.0–15.0)
Immature Granulocytes: 1 %
Lymphocytes Relative: 9 %
Lymphs Abs: 2 10*3/uL (ref 0.7–4.0)
MCH: 28.8 pg (ref 26.0–34.0)
MCHC: 32.6 g/dL (ref 30.0–36.0)
MCV: 88.1 fL (ref 80.0–100.0)
Monocytes Absolute: 2.7 10*3/uL — ABNORMAL HIGH (ref 0.1–1.0)
Monocytes Relative: 13 %
Neutro Abs: 16 10*3/uL — ABNORMAL HIGH (ref 1.7–7.7)
Neutrophils Relative %: 77 %
Platelets: 519 10*3/uL — ABNORMAL HIGH (ref 150–400)
RBC: 3.2 MIL/uL — ABNORMAL LOW (ref 3.87–5.11)
RDW: 15.9 % — ABNORMAL HIGH (ref 11.5–15.5)
WBC: 20.9 10*3/uL — ABNORMAL HIGH (ref 4.0–10.5)
nRBC: 0 % (ref 0.0–0.2)

## 2021-03-01 LAB — COMPREHENSIVE METABOLIC PANEL
ALT: 21 U/L (ref 0–44)
AST: 51 U/L — ABNORMAL HIGH (ref 15–41)
Albumin: 2.9 g/dL — ABNORMAL LOW (ref 3.5–5.0)
Alkaline Phosphatase: 110 U/L (ref 38–126)
Anion gap: 10 (ref 5–15)
BUN: 12 mg/dL (ref 6–20)
CO2: 24 mmol/L (ref 22–32)
Calcium: 8.4 mg/dL — ABNORMAL LOW (ref 8.9–10.3)
Chloride: 97 mmol/L — ABNORMAL LOW (ref 98–111)
Creatinine, Ser: 0.97 mg/dL (ref 0.44–1.00)
GFR, Estimated: >60 mL/min (ref >60)
Glucose, Bld: 173 mg/dL — ABNORMAL HIGH (ref 70–99)
Potassium: 3.7 mmol/L (ref 3.5–5.1)
Sodium: 131 mmol/L — ABNORMAL LOW (ref 135–145)
Total Bilirubin: 0.8 mg/dL (ref 0.3–1.2)
Total Protein: 7.9 g/dL (ref 6.5–8.1)

## 2021-03-01 LAB — LACTIC ACID, PLASMA
Lactic Acid, Venous: 2.6 mmol/L (ref 0.5–1.9)
Lactic Acid, Venous: 1.9 mmol/L (ref 0.5–1.9)

## 2021-03-01 LAB — CBG MONITORING, ED
Glucose-Capillary: 116 mg/dL — ABNORMAL HIGH (ref 70–99)
Glucose-Capillary: 120 mg/dL — ABNORMAL HIGH (ref 70–99)

## 2021-03-01 LAB — MAGNESIUM: Magnesium: 2 mg/dL (ref 1.7–2.4)

## 2021-03-01 LAB — RESP PANEL BY RT-PCR (FLU A&B, COVID) ARPGX2
Influenza A by PCR: NEGATIVE
Influenza B by PCR: NEGATIVE
SARS Coronavirus 2 by RT PCR: NEGATIVE

## 2021-03-01 LAB — PROTIME-INR
INR: 1.2 (ref 0.8–1.2)
Prothrombin Time: 15.1 seconds (ref 11.4–15.2)

## 2021-03-01 LAB — URINE CULTURE: Culture: <10000 — AB

## 2021-03-01 LAB — MRSA PCR SCREENING: MRSA by PCR: NEGATIVE

## 2021-03-01 LAB — APTT: aPTT: 38 seconds — ABNORMAL HIGH (ref 24–36)

## 2021-03-01 MED ORDER — SODIUM CHLORIDE 0.9 % IV SOLN
2.0000 g | Freq: Three times a day (TID) | INTRAVENOUS | Status: DC
  Administered 2021-03-01 – 2021-03-04 (×9): 2 g via INTRAVENOUS
  Filled 2021-03-01 (×12): qty 2

## 2021-03-01 MED ORDER — ENOXAPARIN SODIUM 40 MG/0.4ML IJ SOSY
40.0000 mg | PREFILLED_SYRINGE | INTRAMUSCULAR | Status: DC
  Administered 2021-03-01 – 2021-03-03 (×3): 40 mg via SUBCUTANEOUS
  Filled 2021-03-01 (×3): qty 0.4

## 2021-03-01 MED ORDER — LACTATED RINGERS IV SOLN: INTRAVENOUS | Status: DC

## 2021-03-01 MED ORDER — METRONIDAZOLE 500 MG/100ML IV SOLN
500.0000 mg | Freq: Three times a day (TID) | INTRAVENOUS | Status: DC
  Administered 2021-03-01 – 2021-03-04 (×9): 500 mg via INTRAVENOUS
  Filled 2021-03-01 (×12): qty 100

## 2021-03-01 MED ORDER — ACETAMINOPHEN 500 MG PO TABS
1000.0000 mg | ORAL_TABLET | Freq: Once | ORAL | Status: AC
  Administered 2021-03-01: 1000 mg via ORAL
  Filled 2021-03-01: qty 2

## 2021-03-01 MED ORDER — LIDOCAINE-PRILOCAINE 2.5-2.5 % EX CREA
1.0000 "application " | TOPICAL_CREAM | Freq: Once | CUTANEOUS | Status: DC
  Filled 2021-03-01: qty 5

## 2021-03-01 MED ORDER — SODIUM CHLORIDE 0.9 % IV SOLN
2.0000 g | INTRAVENOUS | Status: AC
  Administered 2021-03-01: 2 g via INTRAVENOUS
  Filled 2021-03-01: qty 2

## 2021-03-01 MED ORDER — VANCOMYCIN HCL IN DEXTROSE 1-5 GM/200ML-% IV SOLN: 1000.0000 mg | Freq: Once | INTRAVENOUS | Status: DC

## 2021-03-01 MED ORDER — ONDANSETRON HCL 4 MG PO TABS: 4.0000 mg | ORAL_TABLET | Freq: Four times a day (QID) | ORAL | Status: DC | PRN

## 2021-03-01 MED ORDER — ONDANSETRON HCL 4 MG/2ML IJ SOLN
4.0000 mg | Freq: Four times a day (QID) | INTRAMUSCULAR | Status: DC | PRN
  Administered 2021-03-03 – 2021-03-04 (×2): 4 mg via INTRAVENOUS
  Filled 2021-03-01 (×2): qty 2

## 2021-03-01 MED ORDER — ACETAMINOPHEN 500 MG PO TABS
500.0000 mg | ORAL_TABLET | Freq: Four times a day (QID) | ORAL | Status: DC | PRN
  Administered 2021-03-02 – 2021-03-04 (×5): 1000 mg via ORAL
  Filled 2021-03-01 (×5): qty 2

## 2021-03-01 MED ORDER — LACTATED RINGERS IV BOLUS
1000.0000 mL | Freq: Once | INTRAVENOUS | Status: AC
  Administered 2021-03-01: 1000 mL via INTRAVENOUS

## 2021-03-01 MED ORDER — VANCOMYCIN HCL 1250 MG/250ML IV SOLN
1250.0000 mg | INTRAVENOUS | Status: DC
  Administered 2021-03-02: 1250 mg via INTRAVENOUS
  Filled 2021-03-01 (×2): qty 250

## 2021-03-01 MED ORDER — VANCOMYCIN HCL 1500 MG/300ML IV SOLN
1500.0000 mg | Freq: Once | INTRAVENOUS | Status: AC
  Administered 2021-03-01: 1500 mg via INTRAVENOUS
  Filled 2021-03-01: qty 300

## 2021-03-01 MED ORDER — PANTOPRAZOLE SODIUM 40 MG IV SOLR
40.0000 mg | INTRAVENOUS | Status: DC
  Administered 2021-03-01 – 2021-03-03 (×3): 40 mg via INTRAVENOUS
  Filled 2021-03-01 (×3): qty 40

## 2021-03-01 NOTE — Telephone Encounter (Signed)
Call returned to [facility] and informed per [place], NP to take her to ER. He agreed to take her, but voiced concern of having to wait for 7 hours to be seen

## 2021-03-01 NOTE — Consult Note (Signed)
Pharmacy Antibiotic Note  Lauren Lindsey is a 60 y.o. 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 Saw oncologist yesterday where chemotherapy continues to be on hold given symptoms. Patient was noted to have leukocytosis in clinic and started on Levaquin. However, patient presents to ED today with sepsis. Source of infection unclear at this time. Blood cultures have been drawn including from chemo port.  Pharmacy has been consulted for vanco and cefepime dosing. Patient is also ordered metronidazole.  Plan:  Cefepime 2gms IV q8hrs  Vancomycin 1.5gm Ldx1 followed by maintenance regimen of Vancomycin 1.25gms q24hrs --calculated AUC :484, Cmin:11 --level at steady state --daily Scr per protocol  Height: 5\' 5"  (165.1 cm) Weight: 69.4 kg (153 lb) IBW/kg (Calculated) : 57  Temp (24hrs), Avg:100.5 F (38.1 C), Min:99.4 F (37.4 C), Max:101.6 F (38.7 C)  Recent Labs  Lab 02/28/21 0840 03/01/21 1335 03/01/21 1550  WBC 16.8* 20.9*  --   CREATININE 0.96 0.97  --   LATICACIDVEN  --  2.6* 1.9    Estimated Creatinine Clearance: 61.9 mL/min (by C-G formula based on SCr of 0.97 mg/dL).    No Known Allergies  Antimicrobials this admission: vancomycin 6/09 >>  Cefepime  6/09 >>  flagyl 6/09 >>   Dose adjustments this admission: N/A  Microbiology results: 6/09 BCx: pending 6/09 UCx: pending  6/09 MRSA PCR: pending  Thank you for allowing pharmacy to be a part of this patient's care.  Berta Minor 03/01/2021 4:38 PM

## 2021-03-01 NOTE — Consult Note (Signed)
PHARMACY -  BRIEF ANTIBIOTIC NOTE   Pharmacy has received consult(s) for cefepime and vancomycin from an ED provider.  The patient's profile has been reviewed for ht/wt/allergies/indication/available labs.    One time order(s) placed for  Cefepime 2 gram Vancomycin 1500 mg x 1   Further antibiotics/pharmacy consults should be ordered by admitting physician if indicated.                       Thank you, Dorothe Pea, PharmD, BCPS 03/01/2021  2:14 PM

## 2021-03-01 NOTE — ED Notes (Signed)
Patient transported to CT 

## 2021-03-01 NOTE — ED Notes (Signed)
Called dietary and ordered meal tray

## 2021-03-01 NOTE — Telephone Encounter (Signed)
Mortimer Fries called reporting that patient fever is higher today and that she is not hardly eating.  He states she needs to come in for IV fluids and that she has an appointment this afternoon for an MRI. Please advise

## 2021-03-01 NOTE — ED Notes (Signed)
Meal tray 

## 2021-03-01 NOTE — ED Provider Notes (Signed)
[facility] Emergency Department Provider Note  ____________________________________________   Event Date/Time   First MD Initiated Contact with Patient 03/01/21 1313     (approximate)  I have reviewed the triage vital signs and the nursing notes.   HISTORY  Chief Complaint Fever and Emesis   HPI Lauren Lindsey is a 60 y.o. 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  Patient states she has had fever and nausea and vomiting on and off for the last 3 weeks.  She states she feels particularly weak today and has had several episodes of nonbloody emesis.  She denies any cough, shortness of breath, chest pain, headache or earache, sore throat, abdominal pain, back pain, diarrhea, constipation, urinary symptoms or rash.  No recent falls or injuries.  She has not taken any Tylenol today.  No other acute concerns at this time.         Past Medical History:  Diagnosis Date   Abnormal Pap smear of cervix    Anemia    h/o   Diabetes mellitus without complication ([place])    Intrahepatic cholangiocarcinoma ([place])     Patient Active Problem List   Diagnosis Date Noted   Sepsis ([place]) 03/01/2021   AKI (acute kidney injury) ([place]) 09/29/2020   Type 2 diabetes mellitus ([place]) 09/29/2020   Normocytic anemia 09/29/2020   Microcytic anemia 09/29/2020   Acute renal failure (ARF) ([place]) 09/29/2020   Goals of care, counseling/discussion 08/04/2020   Status post laparoscopic cholecystectomy 06/08/2020   Intrahepatic cholangiocarcinoma ([place]) 05/31/2020   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   Chronic venous insufficiency 08/05/2016   Diabetes mellitus type 2, uncomplicated ([place]) 19/62/2297    Past Surgical History:  Procedure Laterality Date    CHOLECYSTECTOMY     COLONOSCOPY WITH PROPOFOL N/A 06/15/2020   Procedure: COLONOSCOPY WITH PROPOFOL;  Surgeon: Lin Landsman, MD;  Location: [place];  Service: Endoscopy;  Laterality: N/A;  Diabetic - oral meds   ENDOMETRIAL ABLATION     HYSTEROSCOPY WITH D & C     LEEP     PORTA CATH INSERTION N/A 01/04/2021   Procedure: PORTA CATH INSERTION;  Surgeon: Algernon Huxley, MD;  Location: [facility];  Service: Cardiovascular;  Laterality: N/A;    Prior to Admission medications   Medication Sig Start Date End Date Taking? Authorizing Provider  acetaminophen (TYLENOL) 500 MG tablet Take 500 mg by mouth every 6 (six) hours as needed.    [provider]  levofloxacin (LEVAQUIN) 500 MG tablet Take 1 tablet (500 mg total) by mouth daily. Patient not taking: No sig reported 12/28/20   Lloyd Huger, MD  levofloxacin (LEVAQUIN) 500 MG tablet Take 1 tablet (500 mg total) by mouth daily. 02/28/21   Lloyd Huger, MD  lidocaine-prilocaine (EMLA) cream Apply 1 application topically as needed. 01/10/21   Lloyd Huger, MD  metFORMIN (GLUCOPHAGE) 500 MG tablet Take 500 mg by mouth 2 (two) times daily. 10/20/20   [provider]  ondansetron (ZOFRAN) 8 MG tablet Take 1 tablet (8 mg total) by mouth every 8 (eight) hours as needed for nausea or vomiting. 08/31/20   Lloyd Huger, MD  sulfamethoxazole-trimethoprim (BACTRIM DS) 800-160 MG tablet Take 1 tablet by mouth 2 (two) times daily. 01/22/21   Borders, Kirt Boys, NP  Allergies Patient has no known allergies.  Family History  Problem Relation Age of Onset   Bleeding Disorder Mother    Biliary Cirrhosis Mother    Liver cancer Mother    Hypertension Mother    Diabetes Father    Lung cancer Father    Breast cancer Neg Hx     Social History Social History   Tobacco Use   Smoking status: Never   Smokeless tobacco: Never  Vaping Use   Vaping Use: Never used  Substance Use Topics   Alcohol  use: No   Drug use: No    Review of Systems  Review of Systems  Constitutional:  Positive for malaise/fatigue. Negative for chills and fever.  HENT:  Negative for sore throat.   Eyes:  Negative for pain.  Respiratory:  Negative for cough and stridor.   Cardiovascular:  Negative for chest pain.  Gastrointestinal:  Positive for nausea and vomiting.  Genitourinary:  Negative for dysuria.  Musculoskeletal:  Negative for myalgias.  Skin:  Negative for rash.  Neurological:  Positive for weakness. Negative for seizures, loss of consciousness and headaches.  Psychiatric/Behavioral:  Negative for suicidal ideas.   All other systems reviewed and are negative.    ____________________________________________   PHYSICAL EXAM:  VITAL SIGNS: ED Triage Vitals  Enc Vitals Group     BP 03/01/21 1306 140/75     Pulse Rate 03/01/21 1306 (!) 110     Resp 03/01/21 1306 (!) 22     Temp 03/01/21 1306 (!) 101.6 F (38.7 C)     Temp Source 03/01/21 1306 Oral     SpO2 03/01/21 1306 99 %     Weight 03/01/21 1307 153 lb (69.4 kg)     Height 03/01/21 1307 5\' 5"  (1.651 m)     Head Circumference --      Peak Flow --      Pain Score 03/01/21 1307 1     Pain Loc --      Pain Edu? --      Excl. in [place]? --    Vitals:   03/01/21 1306  BP: 140/75  Pulse: (!) 110  Resp: (!) 22  Temp: (!) 101.6 F (38.7 C)  SpO2: 99%   Physical Exam Vitals and nursing note reviewed.  Constitutional:      General: She is in acute distress.     Appearance: She is well-developed. She is ill-appearing.  HENT:     Head: Normocephalic and atraumatic.     Right Ear: External ear normal.     Left Ear: External ear normal.     Nose: Nose normal.     Mouth/Throat:     Mouth: Mucous membranes are dry.  Eyes:     Conjunctiva/sclera: Conjunctivae normal.  Cardiovascular:     Rate and Rhythm: Regular rhythm. Tachycardia present.     Heart sounds: No murmur heard. Pulmonary:     Effort: Pulmonary effort is normal.  Tachypnea present. No respiratory distress.     Breath sounds: Normal breath sounds.  Abdominal:     Palpations: Abdomen is soft.     Tenderness: There is no abdominal tenderness.  Musculoskeletal:     Cervical back: Neck supple.  Skin:    General: Skin is warm and dry.     Capillary Refill: Capillary refill takes more than 3 seconds.  Neurological:     Mental Status: She is alert and oriented to person, place, and time.  Psychiatric:  Mood and Affect: Mood normal.     ____________________________________________   LABS (all labs ordered are listed, but only abnormal results are displayed)  Labs Reviewed  LACTIC ACID, PLASMA - Abnormal; Notable for the following components:      Result Value   Lactic Acid, Venous 2.6 (*)    All other components within normal limits  COMPREHENSIVE METABOLIC PANEL - Abnormal; Notable for the following components:   Sodium 131 (*)    Chloride 97 (*)    Glucose, Bld 173 (*)    Calcium 8.4 (*)    Albumin 2.9 (*)    AST 51 (*)    All other components within normal limits  CBC WITH DIFFERENTIAL/PLATELET - Abnormal; Notable for the following components:   WBC 20.9 (*)    RBC 3.20 (*)    Hemoglobin 9.2 (*)    HCT 28.2 (*)    RDW 15.9 (*)    Platelets 519 (*)    Neutro Abs 16.0 (*)    Monocytes Absolute 2.7 (*)    Abs Immature Granulocytes 0.16 (*)    All other components within normal limits  APTT - Abnormal; Notable for the following components:   aPTT 38 (*)    All other components within normal limits  URINALYSIS, COMPLETE (UACMP) WITH MICROSCOPIC - Abnormal; Notable for the following components:   Color, Urine YELLOW (*)    APPearance CLEAR (*)    Glucose, UA >=500 (*)    Bacteria, UA RARE (*)    All other components within normal limits  RESP PANEL BY RT-PCR (FLU A&B, COVID) ARPGX2  URINE CULTURE  CULTURE, BLOOD (ROUTINE X 2)  CULTURE, BLOOD (ROUTINE X 2)  CULTURE, BLOOD (SINGLE)  PROTIME-INR  MAGNESIUM  LACTIC ACID,  PLASMA  POC URINE PREG, ED   ____________________________________________  EKG  ____________________________________________  RADIOLOGY  ED MD interpretation: Chest x-ray markable for central line with tip in appropriate position and possible density over the right lung base although no overt focal consolidation, large effusion, significant edema pneumothorax or any other clear acute intrathoracic process.  CT abdomen pelvis with known cholangiocarcinoma without evidence of diverticulitis, kidney stone, pyelonephritis, abscess or other acute abdominal pelvic process.  Official radiology report(s): CT ABDOMEN PELVIS WO CONTRAST  Result Date: 03/01/2021 CLINICAL DATA:  Low-grade fever, nausea and vomiting for several days. History of intrahepatic cholangiocarcinoma. EXAM: CT ABDOMEN AND PELVIS WITHOUT CONTRAST TECHNIQUE: Multidetector CT imaging of the abdomen and pelvis was performed following the standard protocol without IV contrast. COMPARISON:  MRI abdomen 12/01/2020. FINDINGS: Lower chest: Lung bases clear.  No pleural or pericardial effusion. Hepatobiliary: Lack of IV contrast limits evaluation of solid abdominal viscera. The patient's cholangiocarcinoma better seen on the prior studies but there is an area of abnormal attenuation in the inferior right hepatic lobe measuring approximately 4.3 cm on image 30 of series 2. The gallbladder has been removed. Biliary tree is negative. Pancreas: Unremarkable. No pancreatic ductal dilatation or surrounding inflammatory changes. Spleen: Normal in size without focal abnormality. Adrenals/Urinary Tract: Adrenal glands are unremarkable. No renal calculi, worrisome lesion, or hydronephrosis. Parapelvic renal cysts are seen and more prominent on the right. Bladder is unremarkable. Stomach/Bowel: Stomach is within normal limits. Appendix appears normal. No evidence of bowel wall thickening, distention, or inflammatory changes. Vascular/Lymphatic: No significant  vascular findings are present. No enlarged abdominal or pelvic lymph nodes. 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 Reproductive: Uterus and bilateral adnexa are unremarkable. Other: None. Musculoskeletal: No acute or focal  abnormality. Degenerative disc disease L3-4 and L4-5 noted. IMPRESSION: No acute abnormality abdomen or pelvis. Known cholangiocarcinoma is seen but better visualized on the comparison MRI. Electronically Signed   By: Inge Rise M.D.   On: 03/01/2021 14:21   DG Chest Port 1 View  Result Date: 03/01/2021 CLINICAL DATA:  Questionable sepsis. EXAM: PORTABLE CHEST 1 VIEW COMPARISON:  No recent prior. FINDINGS: Central line noted with tip over cavoatrial junction. Heart size normal. Low lung volumes. Questionable small density noted over the right lung base. This could represent a small focal area of atelectasis or small rounded infiltrate. To exclude a pulmonary nodule follow-up exam suggested to demonstrate resolution. No pleural effusion or pneumothorax. Degenerative change thoracic spine and both shoulders. IMPRESSION: 1.  Central line noted with tip over cavoatrial junction. 2. Questionable density noted over the right lung base. This could represent a small focal area of atelectasis or small rounded infiltrate. To exclude a pulmonary nodule follow-up PA and lateral chest x-ray suggested to demonstrate resolution. Electronically Signed   By: Marcello Moores  Register   On: 03/01/2021 14:29    ____________________________________________   PROCEDURES  Procedure(s) performed (including Critical Care):  .Critical Care  Date/Time: 03/01/2021 2:57 PM Performed by: Lucrezia Starch, MD Authorized by: Lucrezia Starch, MD   Critical care provider statement:    Critical care time (minutes):  45   Critical care time was exclusive of:  Separately billable procedures and treating other patients   Critical care was necessary to treat or prevent imminent or life-threatening  deterioration of the following conditions:  Sepsis   Critical care was time spent personally by me on the following activities:  Discussions with consultants, evaluation of patient's response to treatment, examination of patient, ordering and performing treatments and interventions, ordering and review of laboratory studies, ordering and review of radiographic studies, pulse oximetry, re-evaluation of patient's condition, obtaining history from patient or surrogate and review of old charts   ____________________________________________   INITIAL IMPRESSION / [place] / ED COURSE     Patient presents with above to history exam for assessment of fever associate with nonbloody nonbilious vomiting and generalized weakness that seems of gotten worse today has been on and off over the last 3 weeks.  On arrival patient is tachycardic at 110, febrile at one 1.6, tachypneic at 22 with otherwise stable vital signs on room air.  Her lungs are clear bilaterally abdomen is soft nontender.  She does appear dry.  Concern for dehydration and possible medical alcoholic derangements from acute infectious process given history of vomiting with tachycardia and fever.  No obvious localizing symptoms on history or exam.  Will obtain chest x-ray and urine studies and while these are pending obtain basic labs including CBC, CMP and lactic and blood and urine cultures given concern for possible sepsis.  Will give IV fluids and Tylenol.  Will also order blood and urine cultures and give a dose of cefepime.  CBC with worsening leukocytosis, yesterday with WBC count of 20.9 compared to 16.8.  Hemoglobin stable at 9.2 compared to 9.9 which is very close to patient's baseline.  Platelets are slightly elevated at 519.  INR is unremarkable.  PTT is slightly elevated at 38.  CMP with 90 of 131, glucose of 173, bicarb 24 without any other significant electrolyte or metabolic derangements.  Urine does not appear infected.   COVID and flu is negative.  Chest x-ray markable for central line with tip in appropriate position and possible  density over the right lung base although no overt focal consolidation, large effusion, significant edema pneumothorax or any other clear acute intrathoracic process.  CT abdomen pelvis with known cholangiocarcinoma without evidence of diverticulitis, kidney stone, pyelonephritis, abscess or other acute abdominal pelvic process.   Has no obvious foci of infection on exam imaging or UA and concern for bacteremia and sepsis.  Patient does have a central line.  Will order blood culture from this as well.  I will admit to medicine service for further evaluation and management.      ____________________________________________   FINAL CLINICAL IMPRESSION(S) / ED DIAGNOSES  Final diagnoses:  Nausea and vomiting, intractability of vomiting not specified, unspecified vomiting type  Sepsis, due to unspecified organism, unspecified whether acute organ dysfunction present ([place])  Dehydration    Medications  vancomycin (VANCOREADY) IVPB 1500 mg/300 mL (has no administration in time range)  lactated ringers bolus 1,000 mL (1,000 mLs Intravenous New Bag/Given 03/01/21 1403)  acetaminophen (TYLENOL) tablet 1,000 mg (1,000 mg Oral Given 03/01/21 1402)  lactated ringers bolus 1,000 mL (1,000 mLs Intravenous New Bag/Given 03/01/21 1416)  ceFEPIme (MAXIPIME) 2 g in sodium chloride 0.9 % 100 mL IVPB (2 g Intravenous New Bag/Given 03/01/21 1423)     ED Discharge Orders     None        Note:  This document was prepared using Dragon voice recognition software and may include unintentional dictation errors.    Lucrezia Starch, MD 03/01/21 [id number]

## 2021-03-01 NOTE — ED Triage Notes (Signed)
Per pt husband, pt has been running a low grade fever with N/V for the past several days, went to have her chemo treatment yesterday but was too weak for treatment and was given fluids and medications for nausea and some abx for possible infection, pt has bile duct cancer.

## 2021-03-01 NOTE — ED Notes (Signed)
Hospitalist at bedside 

## 2021-03-01 NOTE — ED Notes (Signed)
Md. Tamala Julian advised of lactic acid 2.6

## 2021-03-01 NOTE — ED Notes (Signed)
Patient assisted to the bedside commode.

## 2021-03-01 NOTE — H&P (Addendum)
History and Physical    Lauren Lindsey XBD:[id number] DOB: 12/09/1960 DOA: 03/01/2021  PCP: [name], MD   Patient coming from: Home  I have personally briefly reviewed patient's old medical records in [place]  Chief Complaint: Fever/poor oral intake  HPI: Lauren Lindsey is a 60 y.o. 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 Patient's husband states that she was seen at the cancer center yesterday (06/08) and was unable to get her scheduled chemotherapy because she was very weak and had a low-grade fever.  She received IV fluids and was discharged home on Levaquin by her oncologist. Patient's husband states that she was seen on June 1 and at that time her chemotherapy was canceled because she had symptomatic anemia.  Patient was transfused 1 unit of packed RBC and he states that since after receiving the blood transfusion she has had a low-grade fever every day for about a week. She complains of nausea, vomiting and anorexia but denies having any diarrhea.  She denies having any abdominal pain, no urinary symptoms, no cough, no headache, no neck stiffness, no dizziness, no lightheadedness, no palpitations, no diaphoresis, no lower extremity swelling, no focal deficits or blurred vision. Labs show sodium 131, potassium 3.7, chloride 97, bicarb 24, glucose 173, BUN 12, creatinine 0.97, calcium 8.4, magnesium 2.0, alkaline phosphatase 110, albumin 2.9, AST 51, ALT 21, total protein 7.9, total bilirubin 0.8, lactic acid 2.6, white count 20.9, hemoglobin 9.2, hematocrit 28.2, MCV 88.1, RDW 15.9, platelet count 519, PT 15.1, INR 1.2 Respiratory viral panel is negative Chest x-ray reviewed by me shows central line noted with tip over cavoatrial junction. Questionable density noted over the right lung base. This  could represent a small focal area of atelectasis or small rounded infiltrate. To exclude a pulmonary nodule follow-up PA and lateral chest x-ray suggested to demonstrate resolution. CT scan of the abdomen and pelvis shows no acute abnormality abdomen or pelvis. Known cholangiocarcinoma is seen but better visualized on the comparison MRI. Twelve-lead EKG reviewed by me shows sinus rhythm.    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 Patient is febrile with a T-max of 101.6 F, she is tachycardic, tachypneic and has marked leukocytosis with elevated lactic acid level. Unclear source of sepsis at this time.  She received broad-spectrum antibiotic therapy in the ER and will be admitted to the hospital for further evaluation.    Review of Systems: As per HPI otherwise all other systems reviewed and negative.    Past Medical History:  Diagnosis Date   Abnormal Pap smear of cervix    Anemia    h/o   Diabetes mellitus without complication (HCC)    Intrahepatic cholangiocarcinoma (HCC)     Past Surgical History:  Procedure Laterality Date   CHOLECYSTECTOMY     COLONOSCOPY WITH PROPOFOL N/A 06/15/2020   Procedure: COLONOSCOPY WITH PROPOFOL;  Surgeon: Lin Landsman, MD;  Location: [facility];  Service: Endoscopy;  Laterality: N/A;  Diabetic - oral meds   ENDOMETRIAL ABLATION     HYSTEROSCOPY WITH D & C     LEEP     PORTA CATH INSERTION N/A 01/04/2021   Procedure: PORTA CATH INSERTION;  Surgeon: Algernon Huxley, MD;  Location: [address] [facility];  Service: Cardiovascular;  Laterality: N/A;     reports that she has never smoked. She has never used smokeless tobacco. She reports that she does not drink alcohol and does not use drugs.  No Known Allergies  Family History  Problem Relation Age of Onset   Bleeding Disorder Mother     Biliary Cirrhosis Mother    Liver cancer Mother    Hypertension Mother    Diabetes Father    Lung cancer Father    Breast cancer Neg Hx       Prior to Admission medications   Medication Sig Start Date End Date Taking? Authorizing Provider  acetaminophen (TYLENOL) 500 MG tablet Take 500-1,000 mg by mouth every 6 (six) hours as needed for mild pain or moderate pain.   Yes [provider]  levofloxacin (LEVAQUIN) 500 MG tablet Take 1 tablet (500 mg total) by mouth daily. 02/28/21  Yes Lloyd Huger, MD  lidocaine-prilocaine (EMLA) cream Apply 1 application topically as needed. 01/10/21  Yes Lloyd Huger, MD  metFORMIN (GLUCOPHAGE) 500 MG tablet Take 250 mg by mouth daily as needed (high blood sugar).   Yes [provider]  ondansetron (ZOFRAN) 8 MG tablet Take 1 tablet (8 mg total) by mouth every 8 (eight) hours as needed for nausea or vomiting. 08/31/20  Yes Lloyd Huger, MD  levofloxacin (LEVAQUIN) 500 MG tablet Take 1 tablet (500 mg total) by mouth daily. Patient not taking: No sig reported 12/28/20   Lloyd Huger, MD  sulfamethoxazole-trimethoprim (BACTRIM DS) 800-160 MG tablet Take 1 tablet by mouth 2 (two) times daily. Patient not taking: Reported on 03/01/2021 01/22/21   Irean Hong, NP    Physical Exam: Vitals:   03/01/21 1306 03/01/21 1307 03/01/21 1528  BP: 140/75    Pulse: (!) 110    Resp: (!) 22    Temp: (!) 101.6 F (38.7 C)  99.4 F (37.4 C)  TempSrc: Oral  Oral  SpO2: 99%    Weight:  69.4 kg   Height:  5\' 5"  (1.651 m)      Vitals:   03/01/21 1306 03/01/21 1307 03/01/21 1528  BP: 140/75    Pulse: (!) 110    Resp: (!) 22    Temp: (!) 101.6 F (38.7 C)  99.4 F (37.4 C)  TempSrc: Oral  Oral  SpO2: 99%    Weight:  69.4 kg   Height:  5\' 5"  (1.651 m)       Constitutional: Alert and oriented x 3 . Not in any apparent distress chronically ill-appearing HEENT:      Head: Normocephalic and atraumatic.         Eyes:  PERLA, EOMI, Conjunctivae are normal. Sclera is non-icteric.       Mouth/Throat: Mucous membranes are moist.       Neck: Supple with no signs of meningismus. Cardiovascular: Tachycardic. No murmurs, gallops, or rubs. 2+ symmetrical distal pulses are present . No JVD. No LE edema Respiratory: Respiratory effort normal .Lungs sounds clear bilaterally. No wheezes, crackles, or rhonchi.  Gastrointestinal: Soft, mild tenderness RUQ, and non distended with positive bowel sounds.  Genitourinary: No CVA tenderness. Musculoskeletal: Nontender with normal range of motion in all extremities. No cyanosis, or erythema of extremities. Neurologic:  Face is symmetric. Moving all extremities. No gross focal neurologic deficits . Skin: Skin is warm, dry.  No rash or ulcers Psychiatric: Mood and affect are normal    Labs on Admission:  I have personally reviewed following labs and imaging studies  CBC: Recent Labs  Lab 02/28/21 0840 03/01/21 1335  WBC 16.8* 20.9*  NEUTROABS 13.4* 16.0*  HGB 9.9* 9.2*  HCT 30.3* 28.2*  MCV 88.6 88.1  PLT 565* 924*   Basic Metabolic Panel: Recent Labs  Lab 02/28/21 0840 03/01/21 1335  NA 133* 131*  K 3.9 3.7  CL 98 97*  CO2 24 24  GLUCOSE 226* 173*  BUN 14 12  CREATININE 0.96 0.97  CALCIUM 8.5* 8.4*  MG  --  2.0   GFR: Estimated Creatinine Clearance: 61.9 mL/min (by C-G formula based on SCr of 0.97 mg/dL). Liver Function Tests: Recent Labs  Lab 02/28/21 0840 03/01/21 1335  AST 33 51*  ALT 21 21  ALKPHOS 112 110  BILITOT 0.6 0.8  PROT 7.8 7.9  ALBUMIN 2.9* 2.9*   No results for input(s): LIPASE, AMYLASE in the last 168 hours. No results for input(s): AMMONIA in the last 168 hours. Coagulation Profile: Recent Labs  Lab 03/01/21 1335  INR 1.2   Cardiac Enzymes: No results for input(s): CKTOTAL, CKMB, CKMBINDEX, TROPONINI in the last 168 hours. BNP (last 3 results) No results for input(s): PROBNP in the last 8760 hours. HbA1C: No results  for input(s): HGBA1C in the last 72 hours. CBG: No results for input(s): GLUCAP in the last 168 hours. Lipid Profile: No results for input(s): CHOL, HDL, LDLCALC, TRIG, CHOLHDL, LDLDIRECT in the last 72 hours. Thyroid Function Tests: No results for input(s): TSH, T4TOTAL, FREET4, T3FREE, THYROIDAB in the last 72 hours. Anemia Panel: No results for input(s): VITAMINB12, FOLATE, FERRITIN, TIBC, IRON, RETICCTPCT in the last 72 hours. Urine analysis:    Component Value Date/Time   COLORURINE YELLOW (A) 03/01/2021 1335   APPEARANCEUR CLEAR (A) 03/01/2021 1335   LABSPEC 1.013 03/01/2021 1335   PHURINE 6.0 03/01/2021 1335   GLUCOSEU >=500 (A) 03/01/2021 1335   HGBUR NEGATIVE 03/01/2021 1335   BILIRUBINUR NEGATIVE 03/01/2021 1335   KETONESUR NEGATIVE 03/01/2021 1335   PROTEINUR NEGATIVE 03/01/2021 1335   NITRITE NEGATIVE 03/01/2021 1335   LEUKOCYTESUR NEGATIVE 03/01/2021 1335    Radiological Exams on Admission: CT ABDOMEN PELVIS WO CONTRAST  Result Date: 03/01/2021 CLINICAL DATA:  Low-grade fever, nausea and vomiting for several days. History of intrahepatic cholangiocarcinoma. EXAM: CT ABDOMEN AND PELVIS WITHOUT CONTRAST TECHNIQUE: Multidetector CT imaging of the abdomen and pelvis was performed following the standard protocol without IV contrast. COMPARISON:  MRI abdomen 12/01/2020. FINDINGS: Lower chest: Lung bases clear.  No pleural or pericardial effusion. Hepatobiliary: Lack of IV contrast limits evaluation of solid abdominal viscera. The patient's cholangiocarcinoma better seen on the prior studies but there is an area of abnormal attenuation in the inferior right hepatic lobe measuring approximately 4.3 cm on image 30 of series 2. The gallbladder has been removed. Biliary tree is negative. Pancreas: Unremarkable. No pancreatic ductal dilatation or surrounding inflammatory changes. Spleen: Normal in size without focal abnormality. Adrenals/Urinary Tract: Adrenal glands are unremarkable. No  renal calculi, worrisome lesion, or hydronephrosis. Parapelvic renal cysts are seen and more prominent on the right. Bladder is unremarkable. Stomach/Bowel: Stomach is within normal limits. Appendix appears normal. No evidence of bowel wall thickening, distention, or inflammatory changes. Vascular/Lymphatic: No significant vascular findings are present. No enlarged abdominal or pelvic lymph nodes. 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 Reproductive: Uterus and bilateral adnexa are unremarkable. Other: None. Musculoskeletal: No acute or focal abnormality. Degenerative disc disease L3-4 and L4-5 noted. IMPRESSION: No acute abnormality  abdomen or pelvis. Known cholangiocarcinoma is seen but better visualized on the comparison MRI. Electronically Signed   By: Inge Rise M.D.   On: 03/01/2021 14:21   DG Chest Port 1 View  Result Date: 03/01/2021 CLINICAL DATA:  Questionable sepsis. EXAM: PORTABLE CHEST 1 VIEW COMPARISON:  No recent prior. FINDINGS: Central line noted with tip over cavoatrial junction. Heart size normal. Low lung volumes. Questionable small density noted over the right lung base. This could represent a small focal area of atelectasis or small rounded infiltrate. To exclude a pulmonary nodule follow-up exam suggested to demonstrate resolution. No pleural effusion or pneumothorax. Degenerative change thoracic spine and both shoulders. IMPRESSION: 1.  Central line noted with tip over cavoatrial junction. 2. Questionable density noted over the right lung base. This could represent a small focal area of atelectasis or small rounded infiltrate. To exclude a pulmonary nodule follow-up PA and lateral chest x-ray suggested to demonstrate resolution. Electronically Signed   By: Marcello Moores  Register   On: 03/01/2021 14:29     Assessment/Plan Principal Problem:   Sepsis ([place]) Active Problems:   Diabetes mellitus type 2, uncomplicated ([place])   Intrahepatic cholangiocarcinoma ([place])      Sepsis from an unknown source Patient has a history of intrahepatic cholangiocarcinoma and is on chemotherapy. She presents to the emergency room for evaluation of a 1 week history of low-grade fever associated with nausea, vomiting and anorexia. Today on admission she has a T-max of 101.6, she is tachycardic, tachypneic and has marked leukocytosis as well as lactic acidosis. We will place patient empirically on IV antibiotic therapy with vancomycin, Flagyl and cefepime Follow-up results of blood cultures Place patient on IV fluid resuscitation.    Diabetes mellitus type 2 Hold metformin Place patient admitted with diet and advance as tolerated Blood sugar checks every 6 hours    Acute gastritis Unclear etiology Supportive care with IV fluids, antiemetics and IV PPI   DVT prophylaxis: Lovenox  Code Status: full code  Family Communication: Greater than 50% of time was spent discussing patient's condition and plan of care with her and her husband at the bedside.  All questions and concerns have been addressed.  They verbalized understanding and agree with the plan. Disposition Plan: Back to previous home environment Consults called: Oncology Status: At the time of admission, it appears that the appropriate admission status for this patient is inpatient. This is judged to be reasonable and necessary in order to provide the required intensity of service to ensure the patient's safety given the presenting symptoms, physical exam findings, and initial radiographic data in the context of their comorbid condition. Patient requires inpatient status due to high intensity of service, high risk for further deterioration and high frequency of surveillance required.    Collier Bullock MD [facility]     03/01/2021, 4:11 PM

## 2021-03-02 ENCOUNTER — Inpatient Hospital Stay: Payer: BC Managed Care – PPO

## 2021-03-02 DIAGNOSIS — R112 Nausea with vomiting, unspecified: Secondary | ICD-10-CM

## 2021-03-02 DIAGNOSIS — A419 Sepsis, unspecified organism: Secondary | ICD-10-CM

## 2021-03-02 DIAGNOSIS — R6510 Systemic inflammatory response syndrome (SIRS) of non-infectious origin without acute organ dysfunction: Secondary | ICD-10-CM

## 2021-03-02 DIAGNOSIS — E119 Type 2 diabetes mellitus without complications: Secondary | ICD-10-CM

## 2021-03-02 DIAGNOSIS — C221 Intrahepatic bile duct carcinoma: Principal | ICD-10-CM

## 2021-03-02 LAB — URINE CULTURE: Culture: NO GROWTH

## 2021-03-02 LAB — PROCALCITONIN: Procalcitonin: 1.23 ng/mL

## 2021-03-02 LAB — GLUCOSE, CAPILLARY
Glucose-Capillary: 171 mg/dL — ABNORMAL HIGH (ref 70–99)
Glucose-Capillary: 121 mg/dL — ABNORMAL HIGH (ref 70–99)

## 2021-03-02 LAB — CORTISOL-AM, BLOOD: Cortisol - AM: 25.3 ug/dL — ABNORMAL HIGH (ref 6.7–22.6)

## 2021-03-02 LAB — BASIC METABOLIC PANEL
Anion gap: 8 (ref 5–15)
BUN: 11 mg/dL (ref 6–20)
CO2: 26 mmol/L (ref 22–32)
Calcium: 8.5 mg/dL — ABNORMAL LOW (ref 8.9–10.3)
Chloride: 103 mmol/L (ref 98–111)
Creatinine, Ser: 0.92 mg/dL (ref 0.44–1.00)
GFR, Estimated: >60 mL/min (ref >60)
Glucose, Bld: 127 mg/dL — ABNORMAL HIGH (ref 70–99)
Potassium: 3.6 mmol/L (ref 3.5–5.1)
Sodium: 137 mmol/L (ref 135–145)

## 2021-03-02 LAB — HEMOGLOBIN A1C
Hgb A1c MFr Bld: 6.9 % — ABNORMAL HIGH (ref 4.8–5.6)
Mean Plasma Glucose: 151 mg/dL

## 2021-03-02 LAB — CBC
HCT: 28.3 % — ABNORMAL LOW (ref 36.0–46.0)
Hemoglobin: 9.2 g/dL — ABNORMAL LOW (ref 12.0–15.0)
MCH: 28.8 pg (ref 26.0–34.0)
MCHC: 32.5 g/dL (ref 30.0–36.0)
MCV: 88.7 fL (ref 80.0–100.0)
Platelets: 510 10*3/uL — ABNORMAL HIGH (ref 150–400)
RBC: 3.19 MIL/uL — ABNORMAL LOW (ref 3.87–5.11)
RDW: 16 % — ABNORMAL HIGH (ref 11.5–15.5)
WBC: 23.1 10*3/uL — ABNORMAL HIGH (ref 4.0–10.5)
nRBC: 0 % (ref 0.0–0.2)

## 2021-03-02 LAB — PROTIME-INR
INR: 1.2 (ref 0.8–1.2)
Prothrombin Time: 15.6 seconds — ABNORMAL HIGH (ref 11.4–15.2)

## 2021-03-02 LAB — CBG MONITORING, ED: Glucose-Capillary: 105 mg/dL — ABNORMAL HIGH (ref 70–99)

## 2021-03-02 MED ORDER — CHLORHEXIDINE GLUCONATE CLOTH 2 % EX PADS
6.0000 | MEDICATED_PAD | Freq: Every day | CUTANEOUS | Status: DC
  Administered 2021-03-02 – 2021-03-04 (×3): 6 via TOPICAL

## 2021-03-02 MED ORDER — METFORMIN HCL 500 MG PO TABS
500.0000 mg | ORAL_TABLET | Freq: Two times a day (BID) | ORAL | Status: DC
  Administered 2021-03-02 – 2021-03-03 (×3): 500 mg via ORAL
  Administered 2021-03-04: 09:00:00 250 mg via ORAL
  Filled 2021-03-02 (×4): qty 1

## 2021-03-02 MED ORDER — ALUM & MAG HYDROXIDE-SIMETH 200-200-20 MG/5ML PO SUSP
15.0000 mL | Freq: Four times a day (QID) | ORAL | Status: DC | PRN
  Filled 2021-03-02: qty 30

## 2021-03-02 MED ORDER — GADOBUTROL 1 MMOL/ML IV SOLN
6.0000 mL | Freq: Once | INTRAVENOUS | Status: AC | PRN
  Administered 2021-03-02: 7.5 mL via INTRAVENOUS

## 2021-03-02 MED ORDER — INSULIN ASPART 100 UNIT/ML IJ SOLN: 0.0000 [IU] | Freq: Three times a day (TID) | INTRAMUSCULAR | Status: DC

## 2021-03-02 NOTE — Consult Note (Signed)
Pharmacy Antibiotic Note  Lauren Lindsey is a 60 y.o. 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 Saw oncologist yesterday where chemotherapy continues to be on hold given symptoms. Patient was noted to have leukocytosis in clinic and started on Levaquin. However, patient presents to ED today with sepsis. Source of infection unclear at this time. Blood cultures have been drawn including from chemo port.  Pharmacy has been consulted for vanco and cefepime dosing. Patient is also ordered metronidazole.  D2 vanc/cefepime  Plan: Cefepime 2gms IV q8hrs  Vancomycin 1.5gm Ldx1 followed by maintenance regimen of Vancomycin 1.25gms q24hrs --calculated AUC :484, Cmin:11 --level at steady state --daily Scr per protocol  Patient is also ordered metronidazole 500 mg IV q8h   Height: 5\' 5"  (165.1 cm) Weight: 69.4 kg (153 lb) IBW/kg (Calculated) : 57  Temp (24hrs), Avg:99.2 F (37.3 C), Min:98.2 F (36.8 C), Max:101 F (38.3 C)  Recent Labs  Lab 02/28/21 0840 03/01/21 1335 03/01/21 1550 03/02/21 0421  WBC 16.8* 20.9*  --  23.1*  CREATININE 0.96 0.97  --  0.92  LATICACIDVEN  --  2.6* 1.9  --      Estimated Creatinine Clearance: 65.2 mL/min (by C-G formula based on SCr of 0.92 mg/dL).    No Known Allergies  Antimicrobials this admission: vancomycin 6/09 >>  Cefepime  6/09 >>  flagyl 6/09 >>   Dose adjustments this admission: N/A  Microbiology results: 6/09 BCx: NG < 24 hours 6/09 UCx: No growth 6/09 MRSA PCR: negative  Thank you for allowing pharmacy to be a part of this patient's care.  Darnelle Bos, PharmD 03/02/2021 2:00 PM

## 2021-03-02 NOTE — Consult Note (Signed)
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  INTERVAL HISTORY: Patient is a 60 year old female actively receiving chemotherapy for stage IV cholangiocarcinoma who had increasing weakness and fatigue, persistent fevers, along with nausea and vomiting.  She was admitted to the hospital for concern of sepsis.  Blood and urine cultures are negative to date.  Per report patient had fever earlier today but upon evaluation she felt significantly improved.  She has no neurologic complaints.  She has a fair appetite, but denies weight loss.  She has no chest pain, shortness of breath, cough, or hemoptysis.  She denies any nausea, vomiting, constipation, or diarrhea.  She has no urinary complaints.  Patient offers no further specific complaints today.  REVIEW OF SYSTEMS:   Review of Systems  Constitutional:  Positive for fever and malaise/fatigue. Negative for weight loss.  Respiratory: Negative.  Negative for cough, hemoptysis and shortness of breath.   Cardiovascular: Negative.  Negative for chest pain and leg swelling.  Gastrointestinal: Negative.  Negative for abdominal pain.  Genitourinary: Negative.  Negative for dysuria.  Musculoskeletal: Negative.  Negative for back pain.  Skin: Negative.  Negative for rash.  Neurological:  Positive for weakness. Negative for dizziness, focal weakness and headaches.  Psychiatric/Behavioral:  The patient is not nervous/anxious.    As per HPI. Otherwise, a complete review of systems is negative.  PAST MEDICAL HISTORY: Past Medical History:  Diagnosis  Date   Abnormal Pap smear of cervix    Anemia    h/o   Diabetes mellitus without complication ([place])    Intrahepatic cholangiocarcinoma ([place])     PAST SURGICAL HISTORY: Past Surgical History:  Procedure Laterality Date   CHOLECYSTECTOMY     COLONOSCOPY WITH PROPOFOL N/A 06/15/2020   Procedure: COLONOSCOPY WITH PROPOFOL;  Surgeon: Lin Landsman, MD;  Location: [facility];  Service: Endoscopy;  Laterality: N/A;  Diabetic - oral meds   ENDOMETRIAL ABLATION     HYSTEROSCOPY WITH D & C     LEEP     PORTA CATH INSERTION N/A 01/04/2021   Procedure: PORTA CATH INSERTION;  Surgeon: Algernon Huxley, MD;  Location: [facility];  Service: Cardiovascular;  Laterality: N/A;    FAMILY HISTORY: Family History  Problem Relation Age of Onset   Bleeding Disorder Mother    Biliary Cirrhosis Mother    Liver cancer Mother    Hypertension Mother    Diabetes Father    Lung cancer Father    Breast cancer Neg Hx     ADVANCED DIRECTIVES (Y/N):  @ADVDIR @  HEALTH MAINTENANCE: Social History   Tobacco Use   Smoking status: Never   Smokeless tobacco: Never  Vaping Use   Vaping Use: Never used  Substance Use Topics   Alcohol use: No   Drug use: No     Colonoscopy:  PAP:  Bone density:  Lipid panel:  No Known Allergies  Current Facility-Administered Medications  Medication Dose Route Frequency Provider Last Rate Last Admin   acetaminophen (TYLENOL) tablet 500-1,000 mg  500-1,000 mg Oral Q6H PRN Agbata, Tochukwu, MD   1,000 mg at 03/02/21 1144   ceFEPIme (MAXIPIME) 2 g in  sodium chloride 0.9 % 100 mL IVPB  2 g Intravenous Q8H Agbata, Tochukwu, MD 200 mL/hr at 03/02/21 1346 2 g at 03/02/21 1346   Chlorhexidine Gluconate Cloth 2 % PADS 6 each  6 each Topical Daily Caren Griffins, MD   6 each at 03/02/21 1122   enoxaparin (LOVENOX) injection 40 mg  40 mg Subcutaneous Q24H Agbata, Tochukwu, MD   40 mg at 03/01/21 2230   lactated ringers infusion   Intravenous Continuous  Agbata, Tochukwu, MD 125 mL/hr at 03/02/21 0150 New Bag at 03/02/21 0150   lidocaine-prilocaine (EMLA) cream 1 application  1 application Topical Once Agbata, Tochukwu, MD       metFORMIN (GLUCOPHAGE) tablet 500 mg  500 mg Oral BID WC Caren Griffins, MD   500 mg at 03/02/21 1553   metroNIDAZOLE (FLAGYL) IVPB 500 mg  500 mg Intravenous Q8H Agbata, Tochukwu, MD 100 mL/hr at 03/02/21 1555 500 mg at 03/02/21 1555   ondansetron (ZOFRAN) tablet 4 mg  4 mg Oral Q6H PRN Agbata, Tochukwu, MD       Or   ondansetron (ZOFRAN) injection 4 mg  4 mg Intravenous Q6H PRN Agbata, Tochukwu, MD       pantoprazole (PROTONIX) injection 40 mg  40 mg Intravenous Q24H Agbata, Tochukwu, MD   40 mg at 03/02/21 1552   vancomycin (VANCOREADY) IVPB 1250 mg/250 mL  1,250 mg Intravenous Q24H Berta Minor, RPH 166.7 mL/hr at 03/02/21 1634 1,250 mg at 03/02/21 1634   Facility-Administered Medications Ordered in Other Encounters  Medication Dose Route Frequency Provider Last Rate Last Admin   heparin lock flush 100 unit/mL  500 Units Intravenous Once Lloyd Huger, MD       sodium chloride flush (NS) 0.9 % injection 10 mL  10 mL Intravenous PRN Lloyd Huger, MD   10 mL at 01/17/21 0854    OBJECTIVE: Vitals:   03/02/21 1135 03/02/21 2026  BP: 138/61 121/70  Pulse: 94 (!) 102  Resp: 18 16  Temp: (!) 101 F (38.3 C) 100.1 F (37.8 C)  SpO2: 100% 100%     Body mass index is 25.46 kg/m.    ECOG FS:1 - Symptomatic but completely ambulatory  General: Well-developed, well-nourished, no acute distress. Eyes: Pink conjunctiva, anicteric sclera. HEENT: Normocephalic, moist mucous membranes. Lungs: No audible wheezing or coughing. Heart: Regular rate and rhythm. Abdomen: Soft, nontender, no obvious distention. Musculoskeletal: No edema, cyanosis, or clubbing. Neuro: Alert, answering all questions appropriately. Cranial nerves grossly intact. Skin: No rashes or petechiae noted. Psych: Normal  affect. Lymphatics: No cervical, calvicular, axillary or inguinal LAD.   LAB RESULTS:  Lab Results  Component Value Date   NA 137 03/02/2021   K 3.6 03/02/2021   CL 103 03/02/2021   CO2 26 03/02/2021   GLUCOSE 127 (H) 03/02/2021   BUN 11 03/02/2021   CREATININE 0.92 03/02/2021   CALCIUM 8.5 (L) 03/02/2021   PROT 7.9 03/01/2021   ALBUMIN 2.9 (L) 03/01/2021   AST 51 (H) 03/01/2021   ALT 21 03/01/2021   ALKPHOS 110 03/01/2021   BILITOT 0.8 03/01/2021   GFRNONAA >60 03/02/2021   GFRAA >60 05/23/2020    Lab Results  Component Value Date   WBC 23.1 (H) 03/02/2021   NEUTROABS 16.0 (H) 03/01/2021   HGB 9.2 (L) 03/02/2021   HCT 28.3 (L) 03/02/2021   MCV 88.7 03/02/2021   PLT 510 (H) 03/02/2021     STUDIES: CT ABDOMEN PELVIS WO CONTRAST  Result Date:  03/01/2021 CLINICAL DATA:  Low-grade fever, nausea and vomiting for several days. History of intrahepatic cholangiocarcinoma. EXAM: CT ABDOMEN AND PELVIS WITHOUT CONTRAST TECHNIQUE: Multidetector CT imaging of the abdomen and pelvis was performed following the standard protocol without IV contrast. COMPARISON:  MRI abdomen 12/01/2020. FINDINGS: Lower chest: Lung bases clear.  No pleural or pericardial effusion. Hepatobiliary: Lack of IV contrast limits evaluation of solid abdominal viscera. The patient's cholangiocarcinoma better seen on the prior studies but there is an area of abnormal attenuation in the inferior right hepatic lobe measuring approximately 4.3 cm on image 30 of series 2. The gallbladder has been removed. Biliary tree is negative. Pancreas: Unremarkable. No pancreatic ductal dilatation or surrounding inflammatory changes. Spleen: Normal in size without focal abnormality. Adrenals/Urinary Tract: Adrenal glands are unremarkable. No renal calculi, worrisome lesion, or hydronephrosis. Parapelvic renal cysts are seen and more prominent on the right. Bladder is unremarkable. Stomach/Bowel: Stomach is within normal limits. Appendix  appears normal. No evidence of bowel wall thickening, distention, or inflammatory changes. Vascular/Lymphatic: No significant vascular findings are present. No enlarged abdominal or pelvic lymph nodes. 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 Reproductive: Uterus and bilateral adnexa are unremarkable. Other: None. Musculoskeletal: No acute or focal abnormality. Degenerative disc disease L3-4 and L4-5 noted. IMPRESSION: No acute abnormality abdomen or pelvis. Known cholangiocarcinoma is seen but better visualized on the comparison MRI. Electronically Signed   By: Inge Rise M.D.   On: 03/01/2021 14:21   DG Chest Port 1 View  Result Date: 03/01/2021 CLINICAL DATA:  Questionable sepsis. EXAM: PORTABLE CHEST 1 VIEW COMPARISON:  No recent prior. FINDINGS: Central line noted with tip over cavoatrial junction. Heart size normal. Low lung volumes. Questionable small density noted over the right lung base. This could represent a small focal area of atelectasis or small rounded infiltrate. To exclude a pulmonary nodule follow-up exam suggested to demonstrate resolution. No pleural effusion or pneumothorax. Degenerative change thoracic spine and both shoulders. IMPRESSION: 1.  Central line noted with tip over cavoatrial junction. 2. Questionable density noted over the right lung base. This could represent a small focal area of atelectasis or small rounded infiltrate. To exclude a pulmonary nodule follow-up PA and lateral chest x-ray suggested to demonstrate resolution. 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  PLAN:    1.  Stage IV cholangiocarcinoma.  Patient last received chemotherapy with gemcitabine on Feb 07, 2021.  Treatment has been delayed several weeks secondary to declining performance status most recently fevers.  CT of the abdomen and pelvis did not reveal any significant etiology.  MRI of the  abdomen scheduled for yesterday was delayed and this has been reordered to assess patient's disease status. 2.  Fever/concern for sepsis: Blood cultures and urine cultures negative to date.  Patient is still having fevers, but no definitive source has been identified.  Appreciate clinical pharmacy input.  Continue current antibiotics. 3.  Leukocytosis: Despite patient feeling better and IV antibiotics, her white blood cell count continues to increase.  Continue current treatment.  MRI as above. 4.  Anemia: Stable.  Patient's hemoglobin is 9.2.  She received a blood transfusion in clinic approximately week and a half ago. 5.  Thrombocytosis: Likely reactive, monitor. 6.  Hyponatremia: Resolved. 7.  Disposition: Recommend patient remain afebrile for 24 hours and then transition to oral antibiotics upon discharge.  Appreciate consult, will follow.   Lloyd Huger,  MD   03/02/2021 9:08 PM

## 2021-03-02 NOTE — Progress Notes (Signed)
PROGRESS NOTE  Lauren Lindsey HFW:[id number] DOB: [birth year] DOA: 03/01/2021 PCP: [name], MD   LOS: 1 day   Brief Narrative / Interim history: 60 year old female with history of DM, cholangiocarcinoma on chemotherapy comes into the hospital with fever, nausea, vomiting.  She was seen at the cancer center on 6/8 for her next cycle of chemotherapy but this was canceled because she was very weak and had a low-grade temperature.  She received fluids and was placed on Levaquin by primary oncologist and was sent home.  She continues to feel poor and eventually decided to come to the hospital.  In the ED she was febrile to one 1.6, tachycardic, normotensive and had a WBC of 20 K.  She was placed on antibiotics and admitted to the hospital  Subjective / 24h Interval events: She is doing well this morning, feeling a little bit better.  Assessment & Plan: Principal Problem SIRS-unknown source, she comes to the hospital with a week history of fevers associated with nausea, vomiting and poor p.o. intake.  Certainly her GI symptoms could be from chemotherapy.  Chest x-ray showed a questionable density over the right lung base, she is without any respiratory symptoms.  CT scan of the abdomen and pelvis was unremarkable and also did not appreciate any densities over the lung bases.  Port looks good, no tenderness, no swelling, no surrounding cellulitis -For now maintained on broad-spectrum antibiotics, cultures have been sent and are pending.  WBC actually getting worse at 23K  Active Problems Cholangiocarcinoma-currently on chemotherapy as an outpatient  Anemia-likely in the setting of malignancy/chemotherapy  Hyponatremia-due to poor p.o. intake, sodium improving with fluids today  DM2-hold metformin, placed on sliding scale  CBG (last 3)  Recent Labs    03/01/21 1747 03/01/21 2241 03/02/21 0830  GLUCAP 116* 120* 105*   Scheduled Meds:  Chlorhexidine Gluconate Cloth  6 each Topical  Daily   enoxaparin (LOVENOX) injection  40 mg Subcutaneous Q24H   lidocaine-prilocaine  1 application Topical Once   pantoprazole (PROTONIX) IV  40 mg Intravenous Q24H   Continuous Infusions:  ceFEPime (MAXIPIME) IV Stopped (03/02/21 0905)   lactated ringers 125 mL/hr at 03/02/21 0150   metronidazole 500 mg (03/02/21 0918)   vancomycin     PRN Meds:.acetaminophen, ondansetron **OR** ondansetron (ZOFRAN) IV  Diet Orders (From admission, onward)     Start     Ordered   03/01/21 1755  Diet Carb Modified Fluid consistency: Thin; Room service appropriate? Yes  Diet effective now       Question Answer Comment  Diet-HS Snack? Nothing   Calorie Level Medium 1600-2000   Fluid consistency: Thin   Room service appropriate? Yes      03/01/21 1754            DVT prophylaxis: enoxaparin (LOVENOX) injection 40 mg Start: 03/01/21 2200     Code Status: Full Code  Family Communication: No family at bedside  Status is: Inpatient  Remains inpatient appropriate because:Inpatient level of care appropriate due to severity of illness  Dispo: The patient is from: Home              Anticipated d/c is to: Home              Patient currently is not medically stable to d/c.   Difficult to place patient No  Level of care: Med-Surg  Consultants:  None  Procedures:  none  Microbiology  Blood culture 6/9-no growth to date Urine culture 6/9  no growth to date  Antimicrobials: Vancomycin 6/9 Cefepime 6/9 Metronidazole 6/9    Objective: Vitals:   03/02/21 0500 03/02/21 0530 03/02/21 0800 03/02/21 0855  BP:  131/65 122/70 132/71  Pulse: 91 92 86 87  Resp:  16 18 18   Temp:   98.2 F (36.8 C) 98.3 F (36.8 C)  TempSrc:   Oral   SpO2: 96% 96% 96% 100%  Weight:      Height:        Intake/Output Summary (Last 24 hours) at 03/02/2021 0953 Last data filed at 03/02/2021 0319 Gross per 24 hour  Intake 2288.26 ml  Output --  Net 2288.26 ml   Filed Weights   03/01/21 1307   Weight: 69.4 kg    Examination:  Constitutional: NAD Eyes: no scleral icterus ENMT: Mucous membranes are moist.  Neck: normal, supple Respiratory: clear to auscultation bilaterally, no wheezing, no crackles. Normal respiratory effort. No accessory muscle use.  Cardiovascular: Regular rate and rhythm, no murmurs / rubs / gallops. No LE edema.  Abdomen: non distended, no tenderness. Bowel sounds positive.  Musculoskeletal: no clubbing / cyanosis.  Skin: no rashes.  Port looks good Neurologic: CN 2-12 grossly intact. Strength 5/5 in all 4.  Psychiatric: Normal judgment and insight. Alert and oriented x 3. Normal mood.    Data Reviewed: I have independently reviewed following labs and imaging studies chest x-ray-no significant infiltrates  CBC: Recent Labs  Lab 02/28/21 0840 03/01/21 1335 03/02/21 0421  WBC 16.8* 20.9* 23.1*  NEUTROABS 13.4* 16.0*  --   HGB 9.9* 9.2* 9.2*  HCT 30.3* 28.2* 28.3*  MCV 88.6 88.1 88.7  PLT 565* 519* 914*   Basic Metabolic Panel: Recent Labs  Lab 02/28/21 0840 03/01/21 1335 03/02/21 0421  NA 133* 131* 137  K 3.9 3.7 3.6  CL 98 97* 103  CO2 24 24 26   GLUCOSE 226* 173* 127*  BUN 14 12 11   CREATININE 0.96 0.97 0.92  CALCIUM 8.5* 8.4* 8.5*  MG  --  2.0  --    Liver Function Tests: Recent Labs  Lab 02/28/21 0840 03/01/21 1335  AST 33 51*  ALT 21 21  ALKPHOS 112 110  BILITOT 0.6 0.8  PROT 7.8 7.9  ALBUMIN 2.9* 2.9*   Coagulation Profile: Recent Labs  Lab 03/01/21 1335 03/02/21 0421  INR 1.2 1.2   HbA1C: No results for input(s): HGBA1C in the last 72 hours. CBG: Recent Labs  Lab 03/01/21 1747 03/01/21 2241 03/02/21 0830  GLUCAP 116* 120* 105*    Recent Results (from the past 240 hour(s))  Urine Culture     Status: Abnormal   Collection Time: 02/28/21 11:01 AM   Specimen: Urine, Random  Result Value Ref Range Status   Specimen Description   Final    URINE, RANDOM Performed at [facility], [address], [place], [place] [address]    Special Requests   Final    NONE Performed at [facility], [address] [place], [place] [address]    Culture (A)  Final    <10,000 COLONIES/mL INSIGNIFICANT GROWTH Performed at [facility], [place] [address], [place],  [address]    Report Status 03/01/2021 FINAL  Final  MRSA PCR Screening     Status: None   Collection Time: 03/01/21 12:58 PM   Specimen: Nasopharyngeal  Result Value Ref Range Status   MRSA by PCR NEGATIVE NEGATIVE Final    Comment:        The  GeneXpert MRSA Assay (FDA approved for NASAL specimens only), is one component of a comprehensive MRSA colonization surveillance program. It is not intended to diagnose MRSA infection nor to guide or monitor treatment for MRSA infections. Performed at [facility], [address] [place],  [place] [address]   Resp Panel by RT-PCR (Flu A&B, Covid) Nasopharyngeal Swab     Status: None   Collection Time: 03/01/21  1:35 PM   Specimen: Nasopharyngeal Swab; Nasopharyngeal(NP) swabs in vial transport medium  Result Value Ref Range Status   SARS Coronavirus 2 by RT PCR NEGATIVE NEGATIVE Final    Comment: (NOTE) SARS-CoV-2 target nucleic acids are NOT DETECTED.  The SARS-CoV-2 RNA is generally detectable in upper respiratory specimens during the acute phase of infection. The lowest concentration of SARS-CoV-2 viral copies this assay can detect is 138 copies/mL. A negative result does not preclude SARS-Cov-2 infection and should not be used as the sole basis for treatment or other patient management decisions. A negative result may occur with  improper specimen collection/handling, submission of specimen other than nasopharyngeal swab, presence of viral mutation(s) within the areas targeted by this assay, and inadequate number of viral copies(<138 copies/mL). A negative result must be combined with clinical observations, patient history, and  epidemiological information. The expected result is Negative.  Fact Sheet for Patients:  EntrepreneurPulse.com.au  Fact Sheet for Healthcare Providers:  IncredibleEmployment.be  This test is no t yet approved or cleared by the [place] FDA and  has been authorized for detection and/or diagnosis of SARS-CoV-2 by FDA under an Emergency Use Authorization (EUA). This EUA will remain  in effect (meaning this test can be used) for the duration of the COVID-19 declaration under Section 564(b)(1) of the Act, 21 U.S.C.section 360bbb-3(b)(1), unless the authorization is terminated  or revoked sooner.       Influenza A by PCR NEGATIVE NEGATIVE Final   Influenza B by PCR NEGATIVE NEGATIVE Final    Comment: (NOTE) The Xpert Xpress SARS-CoV-2/FLU/RSV plus assay is intended as an aid in the diagnosis of influenza from Nasopharyngeal swab specimens and should not be used as a sole basis for treatment. Nasal washings and aspirates are unacceptable for Xpert Xpress SARS-CoV-2/FLU/RSV testing.  Fact Sheet for Patients: EntrepreneurPulse.com.au  Fact Sheet for Healthcare Providers: IncredibleEmployment.be  This test is not yet approved or cleared by the [place] FDA and has been authorized for detection and/or diagnosis of SARS-CoV-2 by FDA under an Emergency Use Authorization (EUA). This EUA will remain in effect (meaning this test can be used) for the duration of the COVID-19 declaration under Section 564(b)(1) of the Act, 21 U.S.C. section 360bbb-3(b)(1), unless the authorization is terminated or revoked.  Performed at [facility], [address] [place], [place] [address]   Blood culture (routine x 2)     Status: None (Preliminary result)   Collection Time: 03/01/21  1:35 PM   Specimen: BLOOD RIGHT WRIST  Result Value Ref Range Status   Specimen Description BLOOD RIGHT WRIST  Final   Special  Requests   Final    BOTTLES DRAWN AEROBIC AND ANAEROBIC Blood Culture results may not be optimal due to an inadequate volume of blood received in culture bottles   Culture   Final    NO GROWTH < 24 HOURS Performed at [facility], [address] [place], [place] [address]    Report Status PENDING  Incomplete  Blood culture (routine x 2)     Status: None (Preliminary result)  Collection Time: 03/01/21  1:35 PM   Specimen: BLOOD RIGHT HAND  Result Value Ref Range Status   Specimen Description BLOOD RIGHT HAND  Final   Special Requests   Final    BOTTLES DRAWN AEROBIC AND ANAEROBIC Blood Culture adequate volume   Culture   Final    NO GROWTH < 24 HOURS Performed at [facility], [address], [place], [place] [address]    Report Status PENDING  Incomplete  Blood culture (single)     Status: None (Preliminary result)   Collection Time: 03/01/21  1:35 PM   Specimen: BLOOD  Result Value Ref Range Status   Specimen Description BLOOD PORTA CATH  Final   Special Requests BLOOD Blood Culture adequate volume  Final   Culture   Final    NO GROWTH < 24 HOURS Performed at [facility], [address] [place], [place] [address]    Report Status PENDING  Incomplete     Radiology Studies: CT ABDOMEN PELVIS WO CONTRAST  Result Date: 03/01/2021 CLINICAL DATA:  Low-grade fever, nausea and vomiting for several days. History of intrahepatic cholangiocarcinoma. EXAM: CT ABDOMEN AND PELVIS WITHOUT CONTRAST TECHNIQUE: Multidetector CT imaging of the abdomen and pelvis was performed following the standard protocol without IV contrast. COMPARISON:  MRI abdomen 12/01/2020. FINDINGS: Lower chest: Lung bases clear.  No pleural or pericardial effusion. Hepatobiliary: Lack of IV contrast limits evaluation of solid abdominal viscera. The patient's cholangiocarcinoma better seen on the prior studies but there is an area of abnormal attenuation in the inferior right hepatic lobe  measuring approximately 4.3 cm on image 30 of series 2. The gallbladder has been removed. Biliary tree is negative. Pancreas: Unremarkable. No pancreatic ductal dilatation or surrounding inflammatory changes. Spleen: Normal in size without focal abnormality. Adrenals/Urinary Tract: Adrenal glands are unremarkable. No renal calculi, worrisome lesion, or hydronephrosis. Parapelvic renal cysts are seen and more prominent on the right. Bladder is unremarkable. Stomach/Bowel: Stomach is within normal limits. Appendix appears normal. No evidence of bowel wall thickening, distention, or inflammatory changes. Vascular/Lymphatic: No significant vascular findings are present. No enlarged abdominal or pelvic lymph nodes. 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 Reproductive: Uterus and bilateral adnexa are unremarkable. Other: None. Musculoskeletal: No acute or focal abnormality. Degenerative disc disease L3-4 and L4-5 noted. IMPRESSION: No acute abnormality abdomen or pelvis. Known cholangiocarcinoma is seen but better visualized on the comparison MRI. Electronically Signed   By: Inge Rise M.D.   On: 03/01/2021 14:21   DG Chest Port 1 View  Result Date: 03/01/2021 CLINICAL DATA:  Questionable sepsis. EXAM: PORTABLE CHEST 1 VIEW COMPARISON:  No recent prior. FINDINGS: Central line noted with tip over cavoatrial junction. Heart size normal. Low lung volumes. Questionable small density noted over the right lung base. This could represent a small focal area of atelectasis or small rounded infiltrate. To exclude a pulmonary nodule follow-up exam suggested to demonstrate resolution. No pleural effusion or pneumothorax. Degenerative change thoracic spine and both shoulders. IMPRESSION: 1.  Central line noted with tip over cavoatrial junction. 2. Questionable density noted over the right lung base. This could represent a small focal area of atelectasis or small rounded infiltrate. To exclude a pulmonary nodule  follow-up PA and lateral chest x-ray suggested to demonstrate resolution. Electronically Signed   By: Marcello Moores  Register   On: 03/01/2021 14:29    Marzetta Board, MD, PhD [facility]  Between 7 am - 7 pm I am available, please contact me via  Amion (for emergencies) or Securechat (non urgent messages)  Between 7 pm - 7 am I am not available, please contact night coverage MD/APP via Amion

## 2021-03-02 NOTE — Plan of Care (Signed)
Report given to Tacy Dura, RN.  Transferring to room 103/floor 1c.

## 2021-03-02 NOTE — Plan of Care (Signed)

## 2021-03-03 LAB — CBC WITH DIFFERENTIAL/PLATELET
Abs Immature Granulocytes: 0.13 10*3/uL — ABNORMAL HIGH (ref 0.00–0.07)
Basophils Absolute: 0.1 10*3/uL (ref 0.0–0.1)
Basophils Relative: 0 %
Eosinophils Absolute: 0.1 10*3/uL (ref 0.0–0.5)
Eosinophils Relative: 0 %
HCT: 24.7 % — ABNORMAL LOW (ref 36.0–46.0)
Hemoglobin: 8 g/dL — ABNORMAL LOW (ref 12.0–15.0)
Immature Granulocytes: 1 %
Lymphocytes Relative: 8 %
Lymphs Abs: 1.5 10*3/uL (ref 0.7–4.0)
MCH: 29 pg (ref 26.0–34.0)
MCHC: 32.4 g/dL (ref 30.0–36.0)
MCV: 89.5 fL (ref 80.0–100.0)
Monocytes Absolute: 1.5 10*3/uL — ABNORMAL HIGH (ref 0.1–1.0)
Monocytes Relative: 8 %
Neutro Abs: 16.3 10*3/uL — ABNORMAL HIGH (ref 1.7–7.7)
Neutrophils Relative %: 83 %
Platelets: 420 10*3/uL — ABNORMAL HIGH (ref 150–400)
RBC: 2.76 MIL/uL — ABNORMAL LOW (ref 3.87–5.11)
RDW: 15.9 % — ABNORMAL HIGH (ref 11.5–15.5)
WBC: 19.5 10*3/uL — ABNORMAL HIGH (ref 4.0–10.5)
nRBC: 0 % (ref 0.0–0.2)

## 2021-03-03 LAB — COMPREHENSIVE METABOLIC PANEL
ALT: 15 U/L (ref 0–44)
AST: 23 U/L (ref 15–41)
Albumin: 2.4 g/dL — ABNORMAL LOW (ref 3.5–5.0)
Alkaline Phosphatase: 95 U/L (ref 38–126)
Anion gap: 6 (ref 5–15)
BUN: 10 mg/dL (ref 6–20)
CO2: 26 mmol/L (ref 22–32)
Calcium: 8.2 mg/dL — ABNORMAL LOW (ref 8.9–10.3)
Chloride: 105 mmol/L (ref 98–111)
Creatinine, Ser: 0.78 mg/dL (ref 0.44–1.00)
GFR, Estimated: >60 mL/min (ref >60)
Glucose, Bld: 134 mg/dL — ABNORMAL HIGH (ref 70–99)
Potassium: 3.2 mmol/L — ABNORMAL LOW (ref 3.5–5.1)
Sodium: 137 mmol/L (ref 135–145)
Total Bilirubin: 1 mg/dL (ref 0.3–1.2)
Total Protein: 6.8 g/dL (ref 6.5–8.1)

## 2021-03-03 MED ORDER — VANCOMYCIN HCL 1500 MG/300ML IV SOLN
1500.0000 mg | INTRAVENOUS | Status: DC
  Filled 2021-03-03: qty 300

## 2021-03-03 MED ORDER — POTASSIUM CHLORIDE CRYS ER 20 MEQ PO TBCR
40.0000 meq | EXTENDED_RELEASE_TABLET | Freq: Once | ORAL | Status: AC
  Administered 2021-03-03: 09:00:00 40 meq via ORAL
  Filled 2021-03-03: qty 2

## 2021-03-03 NOTE — Plan of Care (Signed)

## 2021-03-03 NOTE — Progress Notes (Signed)
Pt given tylenol as ordered at this time for oral temp 99.5.  pt c/o headache and feels hot to touch.  Oral temp was 99.5, but likely higher as she reports she just drank ice water

## 2021-03-03 NOTE — Consult Note (Signed)
Pharmacy Antibiotic Note  Lauren Lindsey is a 60 y.o. 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 Saw oncologist yesterday where chemotherapy continues to be on hold given symptoms. Patient was noted to have leukocytosis in clinic and started on Levaquin. However, patient presents to ED today with sepsis. Source of infection unclear at this time. Blood cultures have been drawn including from chemo port.  Pharmacy has been consulted for vanco and cefepime dosing. Patient is also ordered metronidazole.  Plan:  Cefepime 2gms IV q8hrs  Vancomycin 1250 mg q24H , slight improvement in Scr. Adjust vancomycin dose to 1500 mg q24H for an AUC of 486. Goal AUC 400-550. Plan to obtain level prior to the 5th dose.   On metronidazole 500 mg q8H.   Height: 5\' 5"  (165.1 cm) Weight: 69.4 kg (153 lb) IBW/kg (Calculated) : 57  Temp (24hrs), Avg:100.1 F (37.8 C), Min:98.9 F (37.2 C), Max:101 F (38.3 C)  Recent Labs  Lab 02/28/21 0840 03/01/21 1335 03/01/21 1550 03/02/21 0421 03/03/21 0452  WBC 16.8* 20.9*  --  23.1* 19.5*  CREATININE 0.96 0.97  --  0.92 0.78  LATICACIDVEN  --  2.6* 1.9  --   --      Estimated Creatinine Clearance: 75 mL/min (by C-G formula based on SCr of 0.78 mg/dL).    No Known Allergies  Antimicrobials this admission: vancomycin 6/09 >>  Cefepime  6/09 >>  flagyl 6/09 >>   Dose adjustments this admission: N/A  Microbiology results: 6/09 BCx: pending 6/09 UCx: pending  6/09 MRSA PCR: negative.  Thank you for allowing pharmacy to be a part of this patient's care.  Oswald Hillock 03/03/2021 8:56 AM

## 2021-03-03 NOTE — Progress Notes (Signed)
PROGRESS NOTE  Lauren Lindsey FYB:[id number] DOB: 1961/09/02 DOA: 03/01/2021 PCP: [name], MD   LOS: 2 days   Brief Narrative / Interim history: 60 year old female with history of DM, cholangiocarcinoma on chemotherapy comes into the hospital with fever, nausea, vomiting.  She was seen at the cancer center on 6/8 for her next cycle of chemotherapy but this was canceled because she was very weak and had a low-grade temperature.  She received fluids and was placed on Levaquin by primary oncologist and was sent home.  She continues to feel poor and eventually decided to come to the hospital.  In the ED she was febrile to one 1.6, tachycardic, normotensive and had a WBC of 20 K.  She was placed on antibiotics and admitted to the hospital  Subjective / 24h Interval events: Had few episodes of nausea and vomiting last night and feeling nauseous this morning also  Assessment & Plan: Principal Problem SIRS-unknown source, she comes to the hospital with a week history of fevers associated with nausea, vomiting and poor p.o. intake.  Certainly her GI symptoms could be from chemotherapy.  Chest x-ray showed a questionable density over the right lung base, she is without any respiratory symptoms.  CT scan of the abdomen and pelvis was unremarkable and also did not appreciate any densities over the lung bases.  Port looks good, no tenderness, no swelling, no surrounding cellulitis -For now maintained on broad-spectrum antibiotics, cultures have been sent and are pending.  White count improving today -Still febrile last night -Cultures are negative, MRSA PCR was negative and discontinue vancomycin  Active Problems Cholangiocarcinoma-currently on chemotherapy as an outpatient  Anemia-likely in the setting of malignancy/chemotherapy  Hyponatremia-due to poor p.o. intake, sodium improving with fluids today  DM2-hold metformin, placed on sliding scale  CBG (last 3)  Recent Labs    03/02/21 0830  03/02/21 1137 03/02/21 1616  GLUCAP 105* 171* 121*    Scheduled Meds:  Chlorhexidine Gluconate Cloth  6 each Topical Daily   enoxaparin (LOVENOX) injection  40 mg Subcutaneous Q24H   lidocaine-prilocaine  1 application Topical Once   metFORMIN  500 mg Oral BID WC   pantoprazole (PROTONIX) IV  40 mg Intravenous Q24H   Continuous Infusions:  ceFEPime (MAXIPIME) IV 2 g (03/03/21 0603)   lactated ringers 125 mL/hr at 03/02/21 0150   metronidazole 500 mg (03/03/21 0804)   PRN Meds:.acetaminophen, alum & mag hydroxide-simeth, ondansetron **OR** ondansetron (ZOFRAN) IV  Diet Orders (From admission, onward)     Start     Ordered   03/01/21 1755  Diet Carb Modified Fluid consistency: Thin; Room service appropriate? Yes  Diet effective now       Question Answer Comment  Diet-HS Snack? Nothing   Calorie Level Medium 1600-2000   Fluid consistency: Thin   Room service appropriate? Yes      03/01/21 1754            DVT prophylaxis: enoxaparin (LOVENOX) injection 40 mg Start: 03/01/21 2200     Code Status: Full Code  Family Communication: No family at bedside  Status is: Inpatient  Remains inpatient appropriate because:Inpatient level of care appropriate due to severity of illness  Dispo: The patient is from: Home              Anticipated d/c is to: Home              Patient currently is not medically stable to d/c.   Difficult to place patient  No  Level of care: Med-Surg  Consultants:  None  Procedures:  none  Microbiology  Blood culture 6/9-no growth to date Urine culture 6/9 no growth to date  Antimicrobials: Vancomycin 6/9 Cefepime 6/9 Metronidazole 6/9    Objective: Vitals:   03/03/21 0032 03/03/21 0317 03/03/21 0911 03/03/21 1129  BP: (!) 113/58 125/70 136/69 109/83  Pulse: (!) 106 88 79 88  Resp: 16 16 16 15   Temp: (!) 100.5 F (38.1 C) 98.9 F (37.2 C) 98.3 F (36.8 C) 98.3 F (36.8 C)  TempSrc: Oral Oral Oral Oral  SpO2: 98% 100% 100%  100%  Weight:      Height:        Intake/Output Summary (Last 24 hours) at 03/03/2021 1258 Last data filed at 03/03/2021 1034 Gross per 24 hour  Intake 1504.44 ml  Output --  Net 1504.44 ml    Filed Weights   03/01/21 1307  Weight: 69.4 kg    Examination:  Constitutional: No distress Eyes: Anicteric ENMT: mmm Neck: normal, supple Respiratory: clear bilaterally, no wheezing or crackles heard Cardiovascular: Regular rate and rhythm, no murmurs, no peripheral edema Abdomen: Soft, NT, ND, bowel sounds positive Musculoskeletal: no clubbing / cyanosis.  Skin: No rashes seen Neurologic: Nonfocal   Data Reviewed: I have independently reviewed following labs and imaging studies chest x-ray-no significant infiltrates  CBC: Recent Labs  Lab 02/28/21 0840 03/01/21 1335 03/02/21 0421 03/03/21 0452  WBC 16.8* 20.9* 23.1* 19.5*  NEUTROABS 13.4* 16.0*  --  16.3*  HGB 9.9* 9.2* 9.2* 8.0*  HCT 30.3* 28.2* 28.3* 24.7*  MCV 88.6 88.1 88.7 89.5  PLT 565* 519* 510* 420*    Basic Metabolic Panel: Recent Labs  Lab 02/28/21 0840 03/01/21 1335 03/02/21 0421 03/03/21 0452  NA 133* 131* 137 137  K 3.9 3.7 3.6 3.2*  CL 98 97* 103 105  CO2 24 24 26 26   GLUCOSE 226* 173* 127* 134*  BUN 14 12 11 10   CREATININE 0.96 0.97 0.92 0.78  CALCIUM 8.5* 8.4* 8.5* 8.2*  MG  --  2.0  --   --     Liver Function Tests: Recent Labs  Lab 02/28/21 0840 03/01/21 1335 03/03/21 0452  AST 33 51* 23  ALT 21 21 15   ALKPHOS 112 110 95  BILITOT 0.6 0.8 1.0  PROT 7.8 7.9 6.8  ALBUMIN 2.9* 2.9* 2.4*    Coagulation Profile: Recent Labs  Lab 03/01/21 1335 03/02/21 0421  INR 1.2 1.2    HbA1C: Recent Labs    03/02/21 0421  HGBA1C 6.9*   CBG: Recent Labs  Lab 03/01/21 1747 03/01/21 2241 03/02/21 0830 03/02/21 1137 03/02/21 1616  GLUCAP 116* 120* 105* 171* 121*     Recent Results (from the past 240 hour(s))  Urine Culture     Status: Abnormal   Collection Time: 02/28/21  11:01 AM   Specimen: Urine, Random  Result Value Ref Range Status   Specimen Description   Final    URINE, RANDOM Performed at [facility], [address] [place], [place] [address]    Special Requests   Final    NONE Performed at [facility], [address] [place], [place] [address]    Culture (A)  Final    <10,000 COLONIES/mL INSIGNIFICANT GROWTH Performed at [facility], [place] [address], [place], [place] [address]    Report Status 03/01/2021 FINAL  Final  MRSA PCR Screening     Status: None   Collection Time: 03/01/21  12:58 PM   Specimen: Nasopharyngeal  Result Value Ref Range Status   MRSA by PCR NEGATIVE NEGATIVE Final    Comment:        The GeneXpert MRSA Assay (FDA approved for NASAL specimens only), is one component of a comprehensive MRSA colonization surveillance program. It is not intended to diagnose MRSA infection nor to guide or monitor treatment for MRSA infections. Performed at [facility], [address], [place], [place] [address]   Urine culture     Status: None   Collection Time: 03/01/21  1:35 PM   Specimen: Urine, Random  Result Value Ref Range Status   Specimen Description   Final    URINE, RANDOM Performed at [facility], [address] [place], [place] [address]    Special Requests   Final    NONE Performed at [facility], [address], [place], [place] [address]    Culture   Final    NO GROWTH Performed at [facility], [place] [address] [place],  [address]    Report Status 03/02/2021 FINAL  Final  Resp Panel by RT-PCR (Flu A&B, Covid) Nasopharyngeal Swab     Status: None   Collection Time: 03/01/21  1:35 PM   Specimen: Nasopharyngeal Swab; Nasopharyngeal(NP) swabs in vial transport medium  Result Value Ref Range Status   SARS Coronavirus 2 by RT PCR NEGATIVE NEGATIVE Final    Comment: (NOTE) SARS-CoV-2 target nucleic acids are NOT DETECTED.  The SARS-CoV-2 RNA  is generally detectable in upper respiratory specimens during the acute phase of infection. The lowest concentration of SARS-CoV-2 viral copies this assay can detect is 138 copies/mL. A negative result does not preclude SARS-Cov-2 infection and should not be used as the sole basis for treatment or other patient management decisions. A negative result may occur with  improper specimen collection/handling, submission of specimen other than nasopharyngeal swab, presence of viral mutation(s) within the areas targeted by this assay, and inadequate number of viral copies(<138 copies/mL). A negative result must be combined with clinical observations, patient history, and epidemiological information. The expected result is Negative.  Fact Sheet for Patients:  EntrepreneurPulse.com.au  Fact Sheet for Healthcare Providers:  IncredibleEmployment.be  This test is no t yet approved or cleared by the [place] FDA and  has been authorized for detection and/or diagnosis of SARS-CoV-2 by FDA under an Emergency Use Authorization (EUA). This EUA will remain  in effect (meaning this test can be used) for the duration of the COVID-19 declaration under Section 564(b)(1) of the Act, 21 U.S.C.section 360bbb-3(b)(1), unless the authorization is terminated  or revoked sooner.       Influenza A by PCR NEGATIVE NEGATIVE Final   Influenza B by PCR NEGATIVE NEGATIVE Final    Comment: (NOTE) The Xpert Xpress SARS-CoV-2/FLU/RSV plus assay is intended as an aid in the diagnosis of influenza from Nasopharyngeal swab specimens and should not be used as a sole basis for treatment. Nasal washings and aspirates are unacceptable for Xpert Xpress SARS-CoV-2/FLU/RSV testing.  Fact Sheet for Patients: EntrepreneurPulse.com.au  Fact Sheet for Healthcare Providers: IncredibleEmployment.be  This test is not yet approved or cleared by the  [place] FDA and has been authorized for detection and/or diagnosis of SARS-CoV-2 by FDA under an Emergency Use Authorization (EUA). This EUA will remain in effect (meaning this test can be used) for the duration of the COVID-19 declaration under Section 564(b)(1) of the Act, 21 U.S.C. section 360bbb-3(b)(1), unless the authorization is  terminated or revoked.  Performed at [facility], [address] [place], [place] [address]   Blood culture (routine x 2)     Status: None (Preliminary result)   Collection Time: 03/01/21  1:35 PM   Specimen: BLOOD RIGHT WRIST  Result Value Ref Range Status   Specimen Description BLOOD RIGHT WRIST  Final   Special Requests   Final    BOTTLES DRAWN AEROBIC AND ANAEROBIC Blood Culture results may not be optimal due to an inadequate volume of blood received in culture bottles   Culture   Final    NO GROWTH 2 DAYS Performed at [facility], [address], [place], [place] [address]    Report Status PENDING  Incomplete  Blood culture (routine x 2)     Status: None (Preliminary result)   Collection Time: 03/01/21  1:35 PM   Specimen: BLOOD RIGHT HAND  Result Value Ref Range Status   Specimen Description BLOOD RIGHT HAND  Final   Special Requests   Final    BOTTLES DRAWN AEROBIC AND ANAEROBIC Blood Culture adequate volume   Culture   Final    NO GROWTH 2 DAYS Performed at [facility], [address], [place], [place] [address]    Report Status PENDING  Incomplete  Blood culture (single)     Status: None (Preliminary result)   Collection Time: 03/01/21  1:35 PM   Specimen: BLOOD  Result Value Ref Range Status   Specimen Description BLOOD PORTA CATH  Final   Special Requests BLOOD Blood Culture adequate volume  Final   Culture   Final    NO GROWTH 2 DAYS Performed at [facility], [address], [place], [place] [address]    Report Status PENDING  Incomplete      Radiology Studies: MR LIVER W  WO CONTRAST  Result Date: 03/03/2021 CLINICAL DATA:  Cholangiocarcinoma. On chemotherapy. Vomiting. Fever. Nausea EXAM: MRI ABDOMEN WITHOUT AND WITH CONTRAST TECHNIQUE: Multiplanar multisequence MR imaging of the abdomen was performed both before and after the administration of intravenous contrast. CONTRAST:  7.70mL GADAVIST GADOBUTROL 1 MMOL/ML IV SOLN COMPARISON:  03/01/2021 noncontrast CT.  Prior MRI of 12/01/2020. FINDINGS: Lower chest: Normal heart size without pericardial or pleural effusion. Anterior right upper lung 8 mm nodule on 54/19 is only seen on overview images but is similar to 52/18 from prior MRI. Hepatobiliary: Dominant pericholecystic right hepatic lobe (segment 5/6) mass is significantly progressive. Example 5.3 x 4.4 cm on 43/7. On the order of 1.2 x 1.9 cm on the prior exam. New satellite lesions within the more inferior right hepatic lobe, including at 7 mm on 59/17. Gallbladder not visualized, absent by clinical history. No biliary duct dilatation. Pancreas:  Normal, without mass or ductal dilatation. Spleen:  Normal in size, without focal abnormality. Adrenals/Urinary Tract: Normal adrenal glands. Tiny bilateral renal lesions which are too small to characterize but likely cysts. No hydronephrosis. Stomach/Bowel: Normal stomach and abdominal bowel loops. Vascular/Lymphatic: Aortic atherosclerosis. Left IVC. Porta hepatis node of 1.2 cm on 17/6 is felt to be enlarged compared to the prior, where it measures on the order of 8 mm. Other:  No ascites.  No evidence of omental or peritoneal disease. Musculoskeletal: No acute osseous abnormality. IMPRESSION: 1. Significant progression of dominant right hepatic lobe mass and satellite liver lesions. 2. Enlargement of a porta hepatis node, which is borderline sized, suspicious for nodal metastasis. 3. 8 mm right lung nodule, corresponding to a calcified granuloma on the  07/13/2020 PET. 4.  Aortic Atherosclerosis (ICD10-I70.0). Electronically Signed    By: Abigail Miyamoto M.D.   On: 03/03/2021 07:27    Marzetta Board, MD, PhD [facility]  Between 7 am - 7 pm I am available, please contact me via Amion (for emergencies) or Securechat (non urgent messages)  Between 7 pm - 7 am I am not available, please contact night coverage MD/APP via Amion

## 2021-03-04 ENCOUNTER — Inpatient Hospital Stay: Payer: BC Managed Care – PPO

## 2021-03-04 LAB — CBC
HCT: 24.6 % — ABNORMAL LOW (ref 36.0–46.0)
Hemoglobin: 8.1 g/dL — ABNORMAL LOW (ref 12.0–15.0)
MCH: 28.8 pg (ref 26.0–34.0)
MCHC: 32.9 g/dL (ref 30.0–36.0)
MCV: 87.5 fL (ref 80.0–100.0)
Platelets: 469 10*3/uL — ABNORMAL HIGH (ref 150–400)
RBC: 2.81 MIL/uL — ABNORMAL LOW (ref 3.87–5.11)
RDW: 15.9 % — ABNORMAL HIGH (ref 11.5–15.5)
WBC: 22.9 10*3/uL — ABNORMAL HIGH (ref 4.0–10.5)
nRBC: 0 % (ref 0.0–0.2)

## 2021-03-04 LAB — BASIC METABOLIC PANEL
Anion gap: 7 (ref 5–15)
BUN: 7 mg/dL (ref 6–20)
CO2: 27 mmol/L (ref 22–32)
Calcium: 8.1 mg/dL — ABNORMAL LOW (ref 8.9–10.3)
Chloride: 101 mmol/L (ref 98–111)
Creatinine, Ser: 0.63 mg/dL (ref 0.44–1.00)
GFR, Estimated: >60 mL/min (ref >60)
Glucose, Bld: 127 mg/dL — ABNORMAL HIGH (ref 70–99)
Potassium: 3.3 mmol/L — ABNORMAL LOW (ref 3.5–5.1)
Sodium: 135 mmol/L (ref 135–145)

## 2021-03-04 LAB — D-DIMER, QUANTITATIVE: D-Dimer, Quant: 1.36 ug/mL-FEU — ABNORMAL HIGH (ref 0.00–0.50)

## 2021-03-04 MED ORDER — METFORMIN HCL 500 MG PO TABS
250.0000 mg | ORAL_TABLET | Freq: Two times a day (BID) | ORAL | Status: DC
  Filled 2021-03-04 (×3): qty 1

## 2021-03-04 MED ORDER — HEPARIN SOD (PORK) LOCK FLUSH 100 UNIT/ML IV SOLN
500.0000 [IU] | Freq: Once | INTRAVENOUS | Status: AC
  Administered 2021-03-04: 16:00:00 500 [IU] via INTRAVENOUS
  Filled 2021-03-04: qty 5

## 2021-03-04 MED ORDER — POTASSIUM CHLORIDE CRYS ER 20 MEQ PO TBCR
40.0000 meq | EXTENDED_RELEASE_TABLET | Freq: Once | ORAL | Status: AC
  Administered 2021-03-04: 40 meq via ORAL
  Filled 2021-03-04: qty 2

## 2021-03-04 MED ORDER — PANTOPRAZOLE SODIUM 40 MG PO TBEC
40.0000 mg | DELAYED_RELEASE_TABLET | Freq: Every day | ORAL | Status: DC
  Administered 2021-03-04: 10:00:00 40 mg via ORAL
  Filled 2021-03-04: qty 1

## 2021-03-04 NOTE — Progress Notes (Signed)
[place]  [phone] [phone]  ID: Lauren Lindsey OB: 05-May-1961  MR#: [id number]  HGD#:[id number]  Patient Care Team: [name], MD as PCP - General (Family Medicine) Patient, No Pcp Per (Inactive) (General Practice) [name], RN as Oncology Nurse Navigator [name], [name], MD as Consulting Physician (Oncology)  CHIEF COMPLAINT: Stage IV cholangiocarcinoma, fevers.  INTERVAL HISTORY: Patient initially felt improved when last evaluated on Friday, but now states she feels worse.  She continues to have low-grade fevers, but no other complaints.  REVIEW OF SYSTEMS:   Review of Systems  Constitutional:  Positive for fever and malaise/fatigue. Negative for weight loss.  Respiratory: Negative.  Negative for cough, hemoptysis and shortness of breath.   Cardiovascular: Negative.  Negative for chest pain and leg swelling.  Gastrointestinal: Negative.  Negative for abdominal pain, nausea and vomiting.  Genitourinary: Negative.  Negative for dysuria.  Musculoskeletal: Negative.  Negative for back pain.  Skin: Negative.  Negative for rash.  Neurological:  Positive for weakness. Negative for dizziness, focal weakness and headaches.  Psychiatric/Behavioral:  Positive for depression.    As per HPI. Otherwise, a complete review of systems is negative.  PAST MEDICAL HISTORY: Past Medical History:  Diagnosis Date   Abnormal Pap smear of cervix    Anemia    h/o   Diabetes mellitus without complication ([place])    Intrahepatic cholangiocarcinoma ([place])     PAST SURGICAL HISTORY: Past Surgical History:  Procedure Laterality Date   CHOLECYSTECTOMY     COLONOSCOPY WITH PROPOFOL N/A 06/15/2020   Procedure: COLONOSCOPY WITH PROPOFOL;  Surgeon: Lin Landsman, MD;  Location: [facility];  Service: Endoscopy;  Laterality: N/A;  Diabetic - oral meds   ENDOMETRIAL ABLATION     HYSTEROSCOPY WITH D & C     LEEP     PORTA CATH  INSERTION N/A 01/04/2021   Procedure: PORTA CATH INSERTION;  Surgeon: Algernon Huxley, MD;  Location: [facility];  Service: Cardiovascular;  Laterality: N/A;    FAMILY HISTORY: Family History  Problem Relation Age of Onset   Bleeding Disorder Mother    Biliary Cirrhosis Mother    Liver cancer Mother    Hypertension Mother    Diabetes Father    Lung cancer Father    Breast cancer Neg Hx     ADVANCED DIRECTIVES (Y/N):  @ADVDIR @  HEALTH MAINTENANCE: Social History   Tobacco Use   Smoking status: Never   Smokeless tobacco: Never  Vaping Use   Vaping Use: Never used  Substance Use Topics   Alcohol use: No   Drug use: No     Colonoscopy:  PAP:  Bone density:  Lipid panel:  No Known Allergies  Current Facility-Administered Medications  Medication Dose Route Frequency Provider Last Rate Last Admin   acetaminophen (TYLENOL) tablet 500-1,000 mg  500-1,000 mg Oral Q6H PRN Agbata, Tochukwu, MD   1,000 mg at 03/04/21 1007   alum & mag hydroxide-simeth (MAALOX/MYLANTA) 200-200-20 MG/5ML suspension 15 mL  15 mL Oral Q6H PRN Caren Griffins, MD       ceFEPIme (MAXIPIME) 2 g in sodium chloride 0.9 % 100 mL IVPB  2 g Intravenous Q8H Agbata, Tochukwu, MD 200 mL/hr at 03/04/21 0602 2 g at 03/04/21 0602   Chlorhexidine Gluconate Cloth 2 % PADS 6 each  6 each Topical Daily Caren Griffins, MD   6 each at 03/04/21 0937   enoxaparin (LOVENOX) injection 40 mg  40  mg Subcutaneous Q24H Agbata, Tochukwu, MD   40 mg at 03/03/21 2254   lactated ringers infusion   Intravenous Continuous Agbata, Tochukwu, MD 125 mL/hr at 03/04/21 0641 New Bag at 03/04/21 0641   lidocaine-prilocaine (EMLA) cream 1 application  1 application Topical Once Agbata, Tochukwu, MD       metFORMIN (GLUCOPHAGE) tablet 250 mg  250 mg Oral BID WC Gherghe, Costin M, MD       metroNIDAZOLE (FLAGYL) IVPB 500 mg  500 mg Intravenous Q8H Agbata, Tochukwu, MD 100 mL/hr at 03/04/21 0950 500 mg at 03/04/21 0950   ondansetron  (ZOFRAN) tablet 4 mg  4 mg Oral Q6H PRN Agbata, Tochukwu, MD       Or   ondansetron (ZOFRAN) injection 4 mg  4 mg Intravenous Q6H PRN Agbata, Tochukwu, MD   4 mg at 03/04/21 0619   pantoprazole (PROTONIX) EC tablet 40 mg  40 mg Oral Daily Benita Gutter, RPH   40 mg at 03/04/21 0102   Facility-Administered Medications Ordered in Other Encounters  Medication Dose Route Frequency Provider Last Rate Last Admin   heparin lock flush 100 unit/mL  500 Units Intravenous Once Lloyd Huger, MD       sodium chloride flush (NS) 0.9 % injection 10 mL  10 mL Intravenous PRN Lloyd Huger, MD   10 mL at 01/17/21 0854    OBJECTIVE: Vitals:   03/04/21 0713 03/04/21 1107  BP: 133/65 125/66  Pulse: 90 92  Resp: 18 18  Temp: 99.5 F (37.5 C) 100.1 F (37.8 C)  SpO2: 98% 98%     Body mass index is 25.46 kg/m.    ECOG FS:1 - Symptomatic but completely ambulatory  General: Well-developed, well-nourished, no acute distress. Eyes: Pink conjunctiva, anicteric sclera. HEENT: Normocephalic, moist mucous membranes. Lungs: No audible wheezing or coughing. Heart: Regular rate and rhythm. Abdomen: Soft, nontender, no obvious distention. Musculoskeletal: No edema, cyanosis, or clubbing. Neuro: Alert, answering all questions appropriately. Cranial nerves grossly intact. Skin: No rashes or petechiae noted. Psych: Normal affect.    LAB RESULTS:  Lab Results  Component Value Date   NA 135 03/04/2021   K 3.3 (L) 03/04/2021   CL 101 03/04/2021   CO2 27 03/04/2021   GLUCOSE 127 (H) 03/04/2021   BUN 7 03/04/2021   CREATININE 0.63 03/04/2021   CALCIUM 8.1 (L) 03/04/2021   PROT 6.8 03/03/2021   ALBUMIN 2.4 (L) 03/03/2021   AST 23 03/03/2021   ALT 15 03/03/2021   ALKPHOS 95 03/03/2021   BILITOT 1.0 03/03/2021   GFRNONAA >60 03/04/2021   GFRAA >60 05/23/2020    Lab Results  Component Value Date   WBC 22.9 (H) 03/04/2021   NEUTROABS 16.3 (H) 03/03/2021   HGB 8.1 (L) 03/04/2021   HCT  24.6 (L) 03/04/2021   MCV 87.5 03/04/2021   PLT 469 (H) 03/04/2021     STUDIES: CT ABDOMEN PELVIS WO CONTRAST  Result Date: 03/01/2021 CLINICAL DATA:  Low-grade fever, nausea and vomiting for several days. History of intrahepatic cholangiocarcinoma. EXAM: CT ABDOMEN AND PELVIS WITHOUT CONTRAST TECHNIQUE: Multidetector CT imaging of the abdomen and pelvis was performed following the standard protocol without IV contrast. COMPARISON:  MRI abdomen 12/01/2020. FINDINGS: Lower chest: Lung bases clear.  No pleural or pericardial effusion. Hepatobiliary: Lack of IV contrast limits evaluation of solid abdominal viscera. The patient's cholangiocarcinoma better seen on the prior studies but there is an area of abnormal attenuation in the inferior right hepatic lobe measuring approximately  4.3 cm on image 30 of series 2. The gallbladder has been removed. Biliary tree is negative. Pancreas: Unremarkable. No pancreatic ductal dilatation or surrounding inflammatory changes. Spleen: Normal in size without focal abnormality. Adrenals/Urinary Tract: Adrenal glands are unremarkable. No renal calculi, worrisome lesion, or hydronephrosis. Parapelvic renal cysts are seen and more prominent on the right. Bladder is unremarkable. Stomach/Bowel: Stomach is within normal limits. Appendix appears normal. No evidence of bowel wall thickening, distention, or inflammatory changes. Vascular/Lymphatic: No significant vascular findings are present. No enlarged abdominal or pelvic lymph nodes. 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 Reproductive: Uterus and bilateral adnexa are unremarkable. Other: None. Musculoskeletal: No acute or focal abnormality. Degenerative disc disease L3-4 and L4-5 noted. IMPRESSION: No acute abnormality abdomen or pelvis. Known cholangiocarcinoma is seen but better visualized on the comparison MRI. Electronically Signed   By: Inge Rise M.D.   On: 03/01/2021 14:21   CT HEAD WO  CONTRAST  Result Date: 03/04/2021 CLINICAL DATA:  Headache, new or worsening. EXAM: CT HEAD WITHOUT CONTRAST TECHNIQUE: Contiguous axial images were obtained from the base of the skull through the vertex without intravenous contrast. COMPARISON:  None. FINDINGS: Brain: No evidence of acute infarction, hemorrhage, hydrocephalus, extra-axial collection or mass lesion/mass effect. Vascular: No hyperdense vessel or unexpected calcification. Skull: Normal. Negative for fracture or focal lesion. Sinuses/Orbits: No acute finding. IMPRESSION: Negative head CT. Electronically Signed   By: Monte Fantasia M.D.   On: 03/04/2021 09:23   MR LIVER W WO CONTRAST  Result Date: 03/03/2021 CLINICAL DATA:  Cholangiocarcinoma. On chemotherapy. Vomiting. Fever. Nausea EXAM: MRI ABDOMEN WITHOUT AND WITH CONTRAST TECHNIQUE: Multiplanar multisequence MR imaging of the abdomen was performed both before and after the administration of intravenous contrast. CONTRAST:  7.15mL GADAVIST GADOBUTROL 1 MMOL/ML IV SOLN COMPARISON:  03/01/2021 noncontrast CT.  Prior MRI of 12/01/2020. FINDINGS: Lower chest: Normal heart size without pericardial or pleural effusion. Anterior right upper lung 8 mm nodule on 54/19 is only seen on overview images but is similar to 52/18 from prior MRI. Hepatobiliary: Dominant pericholecystic right hepatic lobe (segment 5/6) mass is significantly progressive. Example 5.3 x 4.4 cm on 43/7. On the order of 1.2 x 1.9 cm on the prior exam. New satellite lesions within the more inferior right hepatic lobe, including at 7 mm on 59/17. Gallbladder not visualized, absent by clinical history. No biliary duct dilatation. Pancreas:  Normal, without mass or ductal dilatation. Spleen:  Normal in size, without focal abnormality. Adrenals/Urinary Tract: Normal adrenal glands. Tiny bilateral renal lesions which are too small to characterize but likely cysts. No hydronephrosis. Stomach/Bowel: Normal stomach and abdominal bowel loops.  Vascular/Lymphatic: Aortic atherosclerosis. Left IVC. Porta hepatis node of 1.2 cm on 17/6 is felt to be enlarged compared to the prior, where it measures on the order of 8 mm. Other:  No ascites.  No evidence of omental or peritoneal disease. Musculoskeletal: No acute osseous abnormality. IMPRESSION: 1. Significant progression of dominant right hepatic lobe mass and satellite liver lesions. 2. Enlargement of a porta hepatis node, which is borderline sized, suspicious for nodal metastasis. 3. 8 mm right lung nodule, corresponding to a calcified granuloma on the 07/13/2020 PET. 4.  Aortic Atherosclerosis (ICD10-I70.0). Electronically Signed   By: Abigail Miyamoto M.D.   On: 03/03/2021 07:27   US Venous Img Lower Bilateral (DVT)  Result Date: 03/04/2021 CLINICAL DATA:  60 year old female with a history of leg pain EXAM: BILATERAL LOWER EXTREMITY VENOUS DOPPLER ULTRASOUND TECHNIQUE: Gray-scale sonography with graded compression,  as well as color Doppler and duplex ultrasound were performed to evaluate the lower extremity deep venous systems from the level of the common femoral vein and including the common femoral, femoral, profunda femoral, popliteal and calf veins including the posterior tibial, peroneal and gastrocnemius veins when visible. The superficial great saphenous vein was also interrogated. Spectral Doppler was utilized to evaluate flow at rest and with distal augmentation maneuvers in the common femoral, femoral and popliteal veins. COMPARISON:  None. FINDINGS: RIGHT LOWER EXTREMITY Common Femoral Vein: No evidence of thrombus. Normal compressibility, respiratory phasicity and response to augmentation. Saphenofemoral Junction: No evidence of thrombus. Normal compressibility and flow on color Doppler imaging. Profunda Femoral Vein: No evidence of thrombus. Normal compressibility and flow on color Doppler imaging. Femoral Vein: No evidence of thrombus. Normal compressibility, respiratory phasicity and  response to augmentation. Popliteal Vein: No evidence of thrombus. Normal compressibility, respiratory phasicity and response to augmentation. Calf Veins: No evidence of thrombus. Normal compressibility and flow on color Doppler imaging. Superficial Great Saphenous Vein: No evidence of thrombus. Normal compressibility and flow on color Doppler imaging. Other Findings:  None. LEFT LOWER EXTREMITY Common Femoral Vein: No evidence of thrombus. Normal compressibility, respiratory phasicity and response to augmentation. Saphenofemoral Junction: No evidence of thrombus. Normal compressibility and flow on color Doppler imaging. Profunda Femoral Vein: No evidence of thrombus. Normal compressibility and flow on color Doppler imaging. Femoral Vein: No evidence of thrombus. Normal compressibility, respiratory phasicity and response to augmentation. Popliteal Vein: No evidence of thrombus. Normal compressibility, respiratory phasicity and response to augmentation. Calf Veins: No evidence of thrombus. Normal compressibility and flow on color Doppler imaging. Superficial Great Saphenous Vein: No evidence of thrombus. Normal compressibility and flow on color Doppler imaging. Other Findings:  None. IMPRESSION: Sonographic survey of the bilateral lower extremities negative for DVT Electronically Signed   By: Corrie Mckusick D.O.   On: 03/04/2021 11:41   DG Chest Port 1 View  Result Date: 03/01/2021 CLINICAL DATA:  Questionable sepsis. EXAM: PORTABLE CHEST 1 VIEW COMPARISON:  No recent prior. FINDINGS: Central line noted with tip over cavoatrial junction. Heart size normal. Low lung volumes. Questionable small density noted over the right lung base. This could represent a small focal area of atelectasis or small rounded infiltrate. To exclude a pulmonary nodule follow-up exam suggested to demonstrate resolution. No pleural effusion or pneumothorax. Degenerative change thoracic spine and both shoulders. IMPRESSION: 1.  Central line  noted with tip over cavoatrial junction. 2. Questionable density noted over the right lung base. This could represent a small focal area of atelectasis or small rounded infiltrate. To exclude a pulmonary nodule follow-up PA and lateral chest x-ray suggested to demonstrate resolution. Electronically Signed   By: Marcello Moores  Register   On: 03/01/2021 14:29    ASSESSMENT: Stage IV cholangiocarcinoma, fevers.  PLAN:     1.  Stage IV cholangiocarcinoma.  MRI results from March 02, 2021 reviewed independently and reported as above with significant progression of disease in patient's liver.  She last received chemotherapy with gemcitabine on Feb 07, 2021.  Treatment has been delayed several weeks secondary to declining performance status most recently fevers.  Persistent low-grade fevers may be related to malignancy in the absence of any infectious etiology.  Patient has been instructed to keep her follow-up appointment in the cancer center on Wednesday, March 07, 2021 for further evaluation and treatment planning.  Can consider FOLFOX or other oxaliplatin-based regimen if patient desires additional treatment. 2.  Fevers: Possibly related to  progression of disease. Blood cultures and urine cultures negative to date.  With work-up essentially negative, comfortable with discharging patient on oral antibiotics and follow-up in the cancer center as above.   3.  Leukocytosis: Chronic and unchanged.  Patient's most recent white blood cell count is 22.9.   4.  Anemia: Hemoglobin trended down slightly to 8.1, monitor.  Can consider 1 additional unit of blood in the next 1 to 2 days.  She received a blood transfusion in clinic approximately week and a half ago. 5.  Thrombocytosis: Likely reactive, monitor. 6.  Hyponatremia: Resolved. 7.  Disposition: Although patient continues to have low-grade fevers, this might be related to progression of disease in the absence of a definitive source for infection.  Okay from an oncology  standpoint to discharge on oral antibiotics and follow-up in the cancer center this coming Wednesday.    Lloyd Huger, MD   03/04/2021 12:44 PM

## 2021-03-04 NOTE — Progress Notes (Signed)
Pt provided discharge instructions and pt accompanied her at d/c.   03/04/21 1549  Assess: MEWS Score  Temp 98.9 F (37.2 C)  BP 120/68  Pulse Rate 98  Resp 15  Level of Consciousness Alert  SpO2 100 %  O2 Device Room Air

## 2021-03-04 NOTE — Progress Notes (Deleted)
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  She was seen at the cancer center on 6/8 for her next cycle of chemotherapy but this was canceled because she was very weak and had a low-grade temperature.  She received fluids and was placed on Levaquin by primary oncologist and was sent home.  She continues to feel poor and eventually decided to come to the hospital.  In the ED she was febrile to one 1.6, tachycardic, normotensive and had a WBC of 20 K.  She was placed on antibiotics and admitted to the hospital  Subjective / 24h Interval events: T-max 99.5 but patient tells me she felt quite poorly and she has had some cold water prior to having her temperature checked  Assessment & Plan: Principal Problem SIRS-unknown source, she comes to the hospital with a week history of fevers associated with nausea, vomiting and poor p.o. intake.  Certainly her GI symptoms could be from chemotherapy.  Chest x-ray showed a questionable density over the right lung base, she is without any respiratory symptoms.  CT scan of the abdomen and pelvis was unremarkable and also did not appreciate any densities over the lung bases.  Port looks good, no tenderness, no swelling, no surrounding cellulitis -Cultures are still pending, WBC still up at 22K.  Continue cefepime -Still has low-grade temps -Cultures are negative, MRSA PCR was negative and discontinue vancomycin -Reports some nonspecific symptoms in her left ear/will obtain imaging -Check a D-dimer.  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  MRI looks like tumor is getting a little bit worse  Anemia-likely in the  setting of malignancy/chemotherapy, hemoglobin stable  Hyponatremia-due to poor p.o. intake, sodium now normalized with fluids  Hypokalemia-replete  DM2-hold metformin, placed on sliding scale  CBG (last 3)  Recent Labs    03/02/21 0830 03/02/21 1137 03/02/21 1616  GLUCAP 105* 171* 121*    Scheduled Meds:  Chlorhexidine Gluconate Cloth  6 each Topical Daily   enoxaparin (LOVENOX) injection  40 mg Subcutaneous Q24H   lidocaine-prilocaine  1 application Topical Once   metFORMIN  250 mg Oral BID WC   pantoprazole  40 mg Oral Daily   Continuous Infusions:  ceFEPime (MAXIPIME) IV 2 g (03/04/21 0602)   lactated ringers 125 mL/hr at 03/04/21 0641   metronidazole 500 mg (03/04/21 0115)   PRN Meds:.acetaminophen, alum & mag hydroxide-simeth, ondansetron **OR** ondansetron (ZOFRAN) IV  Diet Orders (From admission, onward)     Start     Ordered   03/01/21 1755  Diet Carb Modified Fluid consistency: Thin; Room service appropriate? Yes  Diet effective now       Question Answer Comment  Diet-HS Snack? Nothing   Calorie Level Medium 1600-2000   Fluid consistency: Thin   Room service appropriate? Yes      03/01/21 1754            DVT prophylaxis: enoxaparin (LOVENOX) injection 40 mg Start: 03/01/21 2200     Code Status: Full Code  Family Communication: No family at bedside  Status is: Inpatient  Remains inpatient appropriate because:Inpatient level of care appropriate due to severity of illness  Dispo: The patient is from: Home  Anticipated d/c is to: Home              Patient currently is not medically stable to d/c.   Difficult to place patient No  Level of care: Med-Surg  Consultants:  None  Procedures:  none  Microbiology  Blood culture 6/9-no growth to date Urine culture 6/9 no growth to date  Antimicrobials: Vancomycin 6/9-6/11 Cefepime 6/9 Metronidazole 6/9    Objective: Vitals:   03/03/21 2054 03/03/21 2324 03/04/21 0520  03/04/21 0713  BP: (!) 111/47 (!) 117/53 124/70 133/65  Pulse: 87 91 89 90  Resp: 16  16 18   Temp: 98.2 F (36.8 C)  99.3 F (37.4 C) 99.5 F (37.5 C)  TempSrc: Oral  Oral   SpO2: 100% 99% 100% 98%  Weight:      Height:        Intake/Output Summary (Last 24 hours) at 03/04/2021 7741 Last data filed at 03/04/2021 0400 Gross per 24 hour  Intake 3863.66 ml  Output --  Net 3863.66 ml    Filed Weights   03/01/21 1307  Weight: 69.4 kg    Examination:  Constitutional: NAD Eyes: No scleral icterus ENMT: Moist mucous membranes Neck: normal, supple Respiratory: Lungs are clear bilaterally, no wheezing, no crackles Cardiovascular: Regular rate and rhythm, no murmurs, no edema Abdomen: Soft, NT, ND, bowel sounds positive Musculoskeletal: no clubbing / cyanosis.  Skin: No rashes apparent Neurologic: No focal deficits   Data Reviewed: I have independently reviewed following labs and imaging studies chest x-ray-no significant infiltrates  CBC: Recent Labs  Lab 02/28/21 0840 03/01/21 1335 03/02/21 0421 03/03/21 0452 03/04/21 0628  WBC 16.8* 20.9* 23.1* 19.5* 22.9*  NEUTROABS 13.4* 16.0*  --  16.3*  --   HGB 9.9* 9.2* 9.2* 8.0* 8.1*  HCT 30.3* 28.2* 28.3* 24.7* 24.6*  MCV 88.6 88.1 88.7 89.5 87.5  PLT 565* 519* 510* 420* 469*    Basic Metabolic Panel: Recent Labs  Lab 02/28/21 0840 03/01/21 1335 03/02/21 0421 03/03/21 0452 03/04/21 0628  NA 133* 131* 137 137 135  K 3.9 3.7 3.6 3.2* 3.3*  CL 98 97* 103 105 101  CO2 24 24 26 26 27   GLUCOSE 226* 173* 127* 134* 127*  BUN 14 12 11 10 7   CREATININE 0.96 0.97 0.92 0.78 0.63  CALCIUM 8.5* 8.4* 8.5* 8.2* 8.1*  MG  --  2.0  --   --   --     Liver Function Tests: Recent Labs  Lab 02/28/21 0840 03/01/21 1335 03/03/21 0452  AST 33 51* 23  ALT 21 21 15   ALKPHOS 112 110 95  BILITOT 0.6 0.8 1.0  PROT 7.8 7.9 6.8  ALBUMIN 2.9* 2.9* 2.4*    Coagulation Profile: Recent Labs  Lab 03/01/21 1335 03/02/21 0421   INR 1.2 1.2    HbA1C: Recent Labs    03/02/21 0421  HGBA1C 6.9*    CBG: Recent Labs  Lab 03/01/21 1747 03/01/21 2241 03/02/21 0830 03/02/21 1137 03/02/21 1616  GLUCAP 116* 120* 105* 171* 121*     Recent Results (from the past 240 hour(s))  Urine Culture     Status: Abnormal   Collection Time: 02/28/21 11:01 AM   Specimen: Urine, Random  Result Value Ref Range Status   Specimen Description   Final    URINE, RANDOM Performed at [facility], [address] [place],  [address]    Special Requests   Final    NONE Performed at [facility], [address] [place], [place] [address]    Culture (A)  Final    <10,000 COLONIES/mL INSIGNIFICANT GROWTH Performed at [facility] [address], [place], [place] [address]    Report Status 03/01/2021 FINAL  Final  MRSA PCR Screening     Status: None   Collection Time: 03/01/21 12:58 PM   Specimen: Nasopharyngeal  Result Value Ref Range Status   MRSA by PCR NEGATIVE NEGATIVE Final    Comment:        The GeneXpert MRSA Assay (FDA approved for NASAL specimens only), is one component of a comprehensive MRSA colonization surveillance program. It is not intended to diagnose MRSA infection nor to guide or monitor treatment for MRSA infections. Performed at [facility], [address] [place], [place] [address]   Urine culture     Status: None   Collection Time: 03/01/21  1:35 PM   Specimen: Urine, Random  Result Value Ref Range Status   Specimen Description   Final    URINE, RANDOM Performed at [facility], [address] [place], [place] [address]    Special Requests   Final    NONE Performed at [facility], [address], [place], [place] [address]    Culture   Final    NO GROWTH Performed at [facility], [place] [address], [place], [place] [address]    Report Status 03/02/2021 FINAL  Final  Resp Panel by RT-PCR (Flu A&B, Covid)  Nasopharyngeal Swab     Status: None   Collection Time: 03/01/21  1:35 PM   Specimen: Nasopharyngeal Swab; Nasopharyngeal(NP) swabs in vial transport medium  Result Value Ref Range Status   SARS Coronavirus 2 by RT PCR NEGATIVE NEGATIVE Final    Comment: (NOTE) SARS-CoV-2 target nucleic acids are NOT DETECTED.  The SARS-CoV-2 RNA is generally detectable in upper respiratory specimens during the acute phase of infection. The lowest concentration of SARS-CoV-2 viral copies this assay can detect is 138 copies/mL. A negative result does not preclude SARS-Cov-2 infection and should not be used as the sole basis for treatment or other patient management decisions. A negative result may occur with  improper specimen collection/handling, submission of specimen other than nasopharyngeal swab, presence of viral mutation(s) within the areas targeted by this assay, and inadequate number of viral copies(<138 copies/mL). A negative result must be combined with clinical observations, patient history, and epidemiological information. The expected result is Negative.  Fact Sheet for Patients:  EntrepreneurPulse.com.au  Fact Sheet for Healthcare Providers:  IncredibleEmployment.be  This test is no t yet approved or cleared by the [place] FDA and  has been authorized for detection and/or diagnosis of SARS-CoV-2 by FDA under an Emergency Use Authorization (EUA). This EUA will remain  in effect (meaning this test can be used) for the duration of the COVID-19 declaration under Section 564(b)(1) of the Act, 21 U.S.C.section 360bbb-3(b)(1), unless the authorization is terminated  or revoked sooner.       Influenza A by PCR NEGATIVE NEGATIVE Final   Influenza B by PCR NEGATIVE NEGATIVE Final    Comment: (NOTE) The Xpert Xpress SARS-CoV-2/FLU/RSV plus assay is intended as an aid in the diagnosis of influenza from Nasopharyngeal swab specimens and should not be  used as a sole basis for treatment. Nasal washings and aspirates are unacceptable for Xpert Xpress SARS-CoV-2/FLU/RSV testing.  Fact Sheet for Patients: EntrepreneurPulse.com.au  Fact Sheet for Healthcare Providers: IncredibleEmployment.be  This test is not yet approved or  cleared by the [place] and has been authorized for detection and/or diagnosis of SARS-CoV-2 by FDA under an Emergency Use Authorization (EUA). This EUA will remain in effect (meaning this test can be used) for the duration of the COVID-19 declaration under Section 564(b)(1) of the Act, 21 U.S.C. section 360bbb-3(b)(1), unless the authorization is terminated or revoked.  Performed at [facility], [address] [place], [place] [address]   Blood culture (routine x 2)     Status: None (Preliminary result)   Collection Time: 03/01/21  1:35 PM   Specimen: BLOOD RIGHT WRIST  Result Value Ref Range Status   Specimen Description BLOOD RIGHT WRIST  Final   Special Requests   Final    BOTTLES DRAWN AEROBIC AND ANAEROBIC Blood Culture results may not be optimal due to an inadequate volume of blood received in culture bottles   Culture   Final    NO GROWTH 2 DAYS Performed at [facility], [address] [place], [place] [address]    Report Status PENDING  Incomplete  Blood culture (routine x 2)     Status: None (Preliminary result)   Collection Time: 03/01/21  1:35 PM   Specimen: BLOOD RIGHT HAND  Result Value Ref Range Status   Specimen Description BLOOD RIGHT HAND  Final   Special Requests   Final    BOTTLES DRAWN AEROBIC AND ANAEROBIC Blood Culture adequate volume   Culture   Final    NO GROWTH 2 DAYS Performed at [facility], [address], [place], [place] [address]    Report Status PENDING  Incomplete  Blood culture (single)     Status: None (Preliminary result)   Collection Time: 03/01/21  1:35 PM   Specimen: BLOOD  Result  Value Ref Range Status   Specimen Description BLOOD PORTA CATH  Final   Special Requests BLOOD Blood Culture adequate volume  Final   Culture   Final    NO GROWTH 2 DAYS Performed at [facility], [address] [place], [place] [address]    Report Status PENDING  Incomplete      Radiology Studies: CT HEAD WO CONTRAST  Result Date: 03/04/2021 CLINICAL DATA:  Headache, new or worsening. EXAM: CT HEAD WITHOUT CONTRAST TECHNIQUE: Contiguous axial images were obtained from the base of the skull through the vertex without intravenous contrast. COMPARISON:  None. FINDINGS: Brain: No evidence of acute infarction, hemorrhage, hydrocephalus, extra-axial collection or mass lesion/mass effect. Vascular: No hyperdense vessel or unexpected calcification. Skull: Normal. Negative for fracture or focal lesion. Sinuses/Orbits: No acute finding. IMPRESSION: Negative head CT. Electronically Signed   By: Monte Fantasia M.D.   On: 03/04/2021 09:23    Marzetta Board, MD, PhD [facility]  Between 7 am - 7 pm I am available, please contact me via Amion (for emergencies) or Securechat (non urgent messages)  Between 7 pm - 7 am I am not available, please contact night coverage MD/APP via Amion

## 2021-03-04 NOTE — Progress Notes (Signed)
PHARMACIST - PHYSICIAN COMMUNICATION  CONCERNING: IV to Oral Route Change Policy  RECOMMENDATION: This patient is receiving pantoprazole by the intravenous route.  Based on criteria approved by the Pharmacy and Therapeutics Committee, the intravenous medication(s) is/are being converted to the equivalent oral dose form(s).   DESCRIPTION: These criteria include: The patient is eating (either orally or via tube) and/or has been taking other orally administered medications for a least 24 hours The patient has no evidence of active gastrointestinal bleeding or impaired GI absorption (gastrectomy, short bowel, patient on TNA or NPO).  If you have questions about this conversion, please contact the [place], [facility] 03/04/2021 7:51 AM

## 2021-03-04 NOTE — Discharge Summary (Signed)
Physician Discharge Summary  Lauren Lindsey OBS:[id number] DOB: Jul 17, 1961 DOA: 03/01/2021  PCP: Lauren Hartigan, MD  Admit date: 03/01/2021 Discharge date: 03/04/2021  Admitted From: home Disposition:  home  Recommendations for Outpatient Follow-up:  Follow up with Dr Lauren Lindsey in 2 days   Home Health: none Equipment/Devices: none  Discharge Condition: stable CODE STATUS: Full code Diet recommendation: regular  HPI: Per admitting MD, Lauren Lindsey is a 60 y.o. 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 Patient's husband states that she was seen at the cancer center yesterday (06/08) and was unable to get her scheduled chemotherapy because she was very weak and had a low-grade fever.  She received IV fluids and was discharged home on Levaquin by her oncologist. Patient's husband states that she was seen on June 1 and at that time her chemotherapy was canceled because she had symptomatic anemia.  Patient was transfused 1 unit of packed RBC and he states that since after receiving the blood transfusion she has had a low-grade fever every day for about a week. She complains of nausea, vomiting and anorexia but denies having any diarrhea.  She denies having any abdominal pain, no urinary symptoms, no cough, no headache, no neck stiffness, no dizziness, no lightheadedness, no palpitations, no diaphoresis, no lower extremity swelling, no focal deficits or blurred vision. Labs show sodium 131, potassium 3.7, chloride 97, bicarb 24, glucose 173, BUN 12, creatinine 0.97, calcium 8.4, magnesium 2.0, alkaline phosphatase 110, albumin 2.9, AST 51, ALT 21, total protein 7.9, total bilirubin 0.8, lactic acid 2.6, white count 20.9, hemoglobin 9.2, hematocrit 28.2, MCV 88.1, RDW 15.9, platelet count 519, PT 15.1, INR 1.2 Respiratory  viral panel is negative Chest x-ray reviewed by me shows central line noted with tip over cavoatrial junction. Questionable density noted over the right lung base. This could represent a small focal area of atelectasis or small rounded infiltrate. To exclude a pulmonary nodule follow-up PA and lateral chest x-ray suggested to demonstrate resolution. CT scan of the abdomen and pelvis shows no acute abnormality abdomen or pelvis. 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 Twelve-lead EKG reviewed by me shows sinus rhythm.  Hospital Course / Discharge diagnoses: Principal Problem SIRS-unknown source, she comes to the hospital with a week history of fevers associated with nausea, vomiting and poor p.o. intake.  Certainly her GI symptoms could be from chemotherapy.  Chest x-ray showed a questionable density over the right lung base, she is without any respiratory symptoms.  CT scan of the abdomen and pelvis was unremarkable and also did not appreciate any densities over the lung bases.  Port looks good, no tenderness, no swelling, no surrounding cellulitis. Cultures have remained negative to date.  She had some lower extremity pain and underwent her ultrasound which was negative for DVT.  She has no shortness of breath/chest pain/tachycardia to suggest underlying PE.  An MRI of the liver showed actually progression of her cancer.  Oncology consulted and followed patient while hospitalized.  Without a clear infectious source is possible that this is all malignancy induced given progression of her cancer, oncology okay with patient going home with close outpatient follow-up.  Will discharge home in stable condition, fever curve overall is improved, continue Levaquin which has been prescribed just before her admission.  She has follow-up with Dr. Grayland Lindsey  in 2 days in the cancer center.  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  MRI looks like  tumor is getting worse Anemia-likely in the setting of malignancy/chemotherapy, hemoglobin stable Hyponatremia-due to poor p.o. intake, sodium now normalized with fluids Hypokalemia-replete DM2-continue home regimen  Sepsis ruled out   Discharge Instructions   Allergies as of 03/04/2021   No Known Allergies      Medication List     STOP taking these medications    sulfamethoxazole-trimethoprim 800-160 MG tablet Commonly known as: BACTRIM DS       TAKE these medications    acetaminophen 500 MG tablet Commonly known as: TYLENOL Take 500-1,000 mg by mouth every 6 (six) hours as needed for mild pain or moderate pain.   levofloxacin 500 MG tablet Commonly known as: Levaquin Take 1 tablet (500 mg total) by mouth daily. What changed: Another medication with the same name was removed. Continue taking this medication, and follow the directions you see here.   lidocaine-prilocaine cream Commonly known as: EMLA Apply 1 application topically as needed.   metFORMIN 500 MG tablet Commonly known as: GLUCOPHAGE Take 250 mg by mouth daily as needed (high blood sugar).   ondansetron 8 MG tablet Commonly known as: ZOFRAN Take 1 tablet (8 mg total) by mouth every 8 (eight) hours as needed for nausea or vomiting.         Consultations: Oncology  Procedures/Studies:  CT ABDOMEN PELVIS WO CONTRAST  Result Date: 03/01/2021 CLINICAL DATA:  Low-grade fever, nausea and vomiting for several days. History of intrahepatic cholangiocarcinoma. EXAM: CT ABDOMEN AND PELVIS WITHOUT CONTRAST TECHNIQUE: Multidetector CT imaging of the abdomen and pelvis was performed following the standard protocol without IV contrast. COMPARISON:  MRI abdomen 12/01/2020. FINDINGS: Lower chest: Lung bases clear.  No pleural or pericardial effusion. Hepatobiliary: Lack of IV contrast limits evaluation of solid abdominal viscera. The patient's cholangiocarcinoma better seen on the prior studies but there is an  area of abnormal attenuation in the inferior right hepatic lobe measuring approximately 4.3 cm on image 30 of series 2. The gallbladder has been removed. Biliary tree is negative. Pancreas: Unremarkable. No pancreatic ductal dilatation or surrounding inflammatory changes. Spleen: Normal in size without focal abnormality. Adrenals/Urinary Tract: Adrenal glands are unremarkable. No renal calculi, worrisome lesion, or hydronephrosis. Parapelvic renal cysts are seen and more prominent on the right. Bladder is unremarkable. Stomach/Bowel: Stomach is within normal limits. Appendix appears normal. No evidence of bowel wall thickening, distention, or inflammatory changes. Vascular/Lymphatic: No significant vascular findings are present. No enlarged abdominal or pelvic lymph nodes. 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 Reproductive: Uterus and bilateral adnexa are unremarkable. Other: None. Musculoskeletal: No acute or focal abnormality. Degenerative disc disease L3-4 and L4-5 noted. IMPRESSION: No acute abnormality abdomen or pelvis. Known cholangiocarcinoma is seen but better visualized on the comparison MRI. Electronically Signed   By: Inge Rise M.D.   On: 03/01/2021 14:21   CT HEAD WO CONTRAST  Result Date: 03/04/2021 CLINICAL DATA:  Headache, new or worsening. EXAM: CT HEAD WITHOUT CONTRAST TECHNIQUE: Contiguous axial images were obtained from the base of the skull through the vertex without intravenous contrast. COMPARISON:  None. FINDINGS: Brain: No evidence of acute infarction, hemorrhage, hydrocephalus, extra-axial collection or mass lesion/mass effect. Vascular: No hyperdense vessel or unexpected calcification. Skull: Normal. Negative for fracture or focal lesion. Sinuses/Orbits: No acute finding. IMPRESSION: Negative head CT. Electronically Signed   By: Monte Fantasia M.D.   On: 03/04/2021 09:23  MR LIVER W WO CONTRAST  Result Date: 03/03/2021 CLINICAL DATA:  Cholangiocarcinoma. On  chemotherapy. Vomiting. Fever. Nausea EXAM: MRI ABDOMEN WITHOUT AND WITH CONTRAST TECHNIQUE: Multiplanar multisequence MR imaging of the abdomen was performed both before and after the administration of intravenous contrast. CONTRAST:  7.35mL GADAVIST GADOBUTROL 1 MMOL/ML IV SOLN COMPARISON:  03/01/2021 noncontrast CT.  Prior MRI of 12/01/2020. FINDINGS: Lower chest: Normal heart size without pericardial or pleural effusion. Anterior right upper lung 8 mm nodule on 54/19 is only seen on overview images but is similar to 52/18 from prior MRI. Hepatobiliary: Dominant pericholecystic right hepatic lobe (segment 5/6) mass is significantly progressive. Example 5.3 x 4.4 cm on 43/7. On the order of 1.2 x 1.9 cm on the prior exam. New satellite lesions within the more inferior right hepatic lobe, including at 7 mm on 59/17. Gallbladder not visualized, absent by clinical history. No biliary duct dilatation. Pancreas:  Normal, without mass or ductal dilatation. Spleen:  Normal in size, without focal abnormality. Adrenals/Urinary Tract: Normal adrenal glands. Tiny bilateral renal lesions which are too small to characterize but likely cysts. No hydronephrosis. Stomach/Bowel: Normal stomach and abdominal bowel loops. Vascular/Lymphatic: Aortic atherosclerosis. Left IVC. Porta hepatis node of 1.2 cm on 17/6 is felt to be enlarged compared to the prior, where it measures on the order of 8 mm. Other:  No ascites.  No evidence of omental or peritoneal disease. Musculoskeletal: No acute osseous abnormality. IMPRESSION: 1. Significant progression of dominant right hepatic lobe mass and satellite liver lesions. 2. Enlargement of a porta hepatis node, which is borderline sized, suspicious for nodal metastasis. 3. 8 mm right lung nodule, corresponding to a calcified granuloma on the 07/13/2020 PET. 4.  Aortic Atherosclerosis (ICD10-I70.0). Electronically Signed   By: Abigail Miyamoto M.D.   On: 03/03/2021 07:27   US Venous Img Lower  Bilateral (DVT)  Result Date: 03/04/2021 CLINICAL DATA:  60 year old female with a history of leg pain EXAM: BILATERAL LOWER EXTREMITY VENOUS DOPPLER ULTRASOUND TECHNIQUE: Gray-scale sonography with graded compression, as well as color Doppler and duplex ultrasound were performed to evaluate the lower extremity deep venous systems from the level of the common femoral vein and including the common femoral, femoral, profunda femoral, popliteal and calf veins including the posterior tibial, peroneal and gastrocnemius veins when visible. The superficial great saphenous vein was also interrogated. Spectral Doppler was utilized to evaluate flow at rest and with distal augmentation maneuvers in the common femoral, femoral and popliteal veins. COMPARISON:  None. FINDINGS: RIGHT LOWER EXTREMITY Common Femoral Vein: No evidence of thrombus. Normal compressibility, respiratory phasicity and response to augmentation. Saphenofemoral Junction: No evidence of thrombus. Normal compressibility and flow on color Doppler imaging. Profunda Femoral Vein: No evidence of thrombus. Normal compressibility and flow on color Doppler imaging. Femoral Vein: No evidence of thrombus. Normal compressibility, respiratory phasicity and response to augmentation. Popliteal Vein: No evidence of thrombus. Normal compressibility, respiratory phasicity and response to augmentation. Calf Veins: No evidence of thrombus. Normal compressibility and flow on color Doppler imaging. Superficial Great Saphenous Vein: No evidence of thrombus. Normal compressibility and flow on color Doppler imaging. Other Findings:  None. LEFT LOWER EXTREMITY Common Femoral Vein: No evidence of thrombus. Normal compressibility, respiratory phasicity and response to augmentation. Saphenofemoral Junction: No evidence of thrombus. Normal compressibility and flow on color Doppler imaging. Profunda Femoral Vein: No evidence of thrombus. Normal compressibility and flow on color Doppler  imaging. Femoral Vein: No evidence of thrombus. Normal compressibility, respiratory phasicity and response to augmentation.  Popliteal Vein: No evidence of thrombus. Normal compressibility, respiratory phasicity and response to augmentation. Calf Veins: No evidence of thrombus. Normal compressibility and flow on color Doppler imaging. Superficial Great Saphenous Vein: No evidence of thrombus. Normal compressibility and flow on color Doppler imaging. Other Findings:  None. IMPRESSION: Sonographic survey of the bilateral lower extremities negative for DVT Electronically Signed   By: Corrie Mckusick D.O.   On: 03/04/2021 11:41   DG Chest Port 1 View  Result Date: 03/01/2021 CLINICAL DATA:  Questionable sepsis. EXAM: PORTABLE CHEST 1 VIEW COMPARISON:  No recent prior. FINDINGS: Central line noted with tip over cavoatrial junction. Heart size normal. Low lung volumes. Questionable small density noted over the right lung base. This could represent a small focal area of atelectasis or small rounded infiltrate. To exclude a pulmonary nodule follow-up exam suggested to demonstrate resolution. No pleural effusion or pneumothorax. Degenerative change thoracic spine and both shoulders. IMPRESSION: 1.  Central line noted with tip over cavoatrial junction. 2. Questionable density noted over the right lung base. This could represent a small focal area of atelectasis or small rounded infiltrate. To exclude a pulmonary nodule follow-up PA and lateral chest x-ray suggested to demonstrate resolution. Electronically Signed   By: Marcello Moores  Register   On: 03/01/2021 14:29     Subjective: - no chest pain, shortness of breath, no abdominal pain, nausea or vomiting.   Discharge Exam: BP 125/66 (BP Location: Right Arm)   Pulse 92   Temp 98.3 F (36.8 C)   Resp 18   Ht 5\' 5"  (1.651 m)   Wt 69.4 kg   LMP 06/26/2019   SpO2 98%   BMI 25.46 kg/m   General: Pt is alert, awake, not in acute distress Cardiovascular: RRR, S1/S2 +,  no rubs, no gallops Respiratory: CTA bilaterally, no wheezing, no rhonchi Abdominal: Soft, NT, ND, bowel sounds + Extremities: no edema, no cyanosis   The results of significant diagnostics from this hospitalization (including imaging, microbiology, ancillary and laboratory) are listed below for reference.     Microbiology: Recent Results (from the past 240 hour(s))  Urine Culture     Status: Abnormal   Collection Time: 02/28/21 11:01 AM   Specimen: Urine, Random  Result Value Ref Range Status   Specimen Description   Final    URINE, RANDOM Performed at [facility], [address], [place], [place] [address]    Special Requests   Final    NONE Performed at [facility], [address] [place], [place] [address]    Culture (A)  Final    <10,000 COLONIES/mL INSIGNIFICANT GROWTH Performed at [facility] [address] [place], [place] [address]    Report Status 03/01/2021 FINAL  Final  MRSA PCR Screening     Status: None   Collection Time: 03/01/21 12:58 PM   Specimen: Nasopharyngeal  Result Value Ref Range Status   MRSA by PCR NEGATIVE NEGATIVE Final    Comment:        The GeneXpert MRSA Assay (FDA approved for NASAL specimens only), is one component of a comprehensive MRSA colonization surveillance program. It is not intended to diagnose MRSA infection nor to guide or monitor treatment for MRSA infections. Performed at [facility], [address], [place], [place] [address]   Urine culture     Status: None   Collection Time: 03/01/21  1:35 PM   Specimen: Urine, Random  Result Value Ref Range Status   Specimen Description  Final    URINE, RANDOM Performed at [facility], [address] [place], [place] [address]    Special Requests   Final    NONE Performed at [facility], [address] [place], [place] [address]    Culture   Final    NO GROWTH Performed at [facility], [place] [address],  [place], [place] [address]    Report Status 03/02/2021 FINAL  Final  Resp Panel by RT-PCR (Flu A&B, Covid) Nasopharyngeal Swab     Status: None   Collection Time: 03/01/21  1:35 PM   Specimen: Nasopharyngeal Swab; Nasopharyngeal(NP) swabs in vial transport medium  Result Value Ref Range Status   SARS Coronavirus 2 by RT PCR NEGATIVE NEGATIVE Final    Comment: (NOTE) SARS-CoV-2 target nucleic acids are NOT DETECTED.  The SARS-CoV-2 RNA is generally detectable in upper respiratory specimens during the acute phase of infection. The lowest concentration of SARS-CoV-2 viral copies this assay can detect is 138 copies/mL. A negative result does not preclude SARS-Cov-2 infection and should not be used as the sole basis for treatment or other patient management decisions. A negative result may occur with  improper specimen collection/handling, submission of specimen other than nasopharyngeal swab, presence of viral mutation(s) within the areas targeted by this assay, and inadequate number of viral copies(<138 copies/mL). A negative result must be combined with clinical observations, patient history, and epidemiological information. The expected result is Negative.  Fact Sheet for Patients:  EntrepreneurPulse.com.au  Fact Sheet for Healthcare Providers:  IncredibleEmployment.be  This test is no t yet approved or cleared by the [place] FDA and  has been authorized for detection and/or diagnosis of SARS-CoV-2 by FDA under an Emergency Use Authorization (EUA). This EUA will remain  in effect (meaning this test can be used) for the duration of the COVID-19 declaration under Section 564(b)(1) of the Act, 21 U.S.C.section 360bbb-3(b)(1), unless the authorization is terminated  or revoked sooner.       Influenza A by PCR NEGATIVE NEGATIVE Final   Influenza B by PCR NEGATIVE NEGATIVE Final    Comment: (NOTE) The Xpert Xpress SARS-CoV-2/FLU/RSV plus assay is  intended as an aid in the diagnosis of influenza from Nasopharyngeal swab specimens and should not be used as a sole basis for treatment. Nasal washings and aspirates are unacceptable for Xpert Xpress SARS-CoV-2/FLU/RSV testing.  Fact Sheet for Patients: EntrepreneurPulse.com.au  Fact Sheet for Healthcare Providers: IncredibleEmployment.be  This test is not yet approved or cleared by the [place] FDA and has been authorized for detection and/or diagnosis of SARS-CoV-2 by FDA under an Emergency Use Authorization (EUA). This EUA will remain in effect (meaning this test can be used) for the duration of the COVID-19 declaration under Section 564(b)(1) of the Act, 21 U.S.C. section 360bbb-3(b)(1), unless the authorization is terminated or revoked.  Performed at [facility], [address] [place], [place] [address]   Blood culture (routine x 2)     Status: None (Preliminary result)   Collection Time: 03/01/21  1:35 PM   Specimen: BLOOD RIGHT WRIST  Result Value Ref Range Status   Specimen Description BLOOD RIGHT WRIST  Final   Special Requests   Final    BOTTLES DRAWN AEROBIC AND ANAEROBIC Blood Culture results may not be optimal due to an inadequate volume of blood received in culture bottles   Culture   Final    NO GROWTH 2 DAYS Performed at [facility], [place],  [place], [place] [address]    Report Status PENDING  Incomplete  Blood culture (routine x 2)     Status: None (Preliminary result)   Collection Time: 03/01/21  1:35 PM   Specimen: BLOOD RIGHT HAND  Result Value Ref Range Status   Specimen Description BLOOD RIGHT HAND  Final   Special Requests   Final    BOTTLES DRAWN AEROBIC AND ANAEROBIC Blood Culture adequate volume   Culture   Final    NO GROWTH 2 DAYS Performed at [facility], [address] [place], [place] [address]    Report Status PENDING  Incomplete  Blood culture (single)      Status: None (Preliminary result)   Collection Time: 03/01/21  1:35 PM   Specimen: BLOOD  Result Value Ref Range Status   Specimen Description BLOOD PORTA CATH  Final   Special Requests BLOOD Blood Culture adequate volume  Final   Culture   Final    NO GROWTH 2 DAYS Performed at [facility], [address] [place],  [address]    Report Status PENDING  Incomplete     Labs: Basic Metabolic Panel: Recent Labs  Lab 02/28/21 0840 03/01/21 1335 03/02/21 0421 03/03/21 0452 03/04/21 0628  NA 133* 131* 137 137 135  K 3.9 3.7 3.6 3.2* 3.3*  CL 98 97* 103 105 101  CO2 24 24 26 26 27   GLUCOSE 226* 173* 127* 134* 127*  BUN 14 12 11 10 7   CREATININE 0.96 0.97 0.92 0.78 0.63  CALCIUM 8.5* 8.4* 8.5* 8.2* 8.1*  MG  --  2.0  --   --   --    Liver Function Tests: Recent Labs  Lab 02/28/21 0840 03/01/21 1335 03/03/21 0452  AST 33 51* 23  ALT 21 21 15   ALKPHOS 112 110 95  BILITOT 0.6 0.8 1.0  PROT 7.8 7.9 6.8  ALBUMIN 2.9* 2.9* 2.4*   CBC: Recent Labs  Lab 02/28/21 0840 03/01/21 1335 03/02/21 0421 03/03/21 0452 03/04/21 0628  WBC 16.8* 20.9* 23.1* 19.5* 22.9*  NEUTROABS 13.4* 16.0*  --  16.3*  --   HGB 9.9* 9.2* 9.2* 8.0* 8.1*  HCT 30.3* 28.2* 28.3* 24.7* 24.6*  MCV 88.6 88.1 88.7 89.5 87.5  PLT 565* 519* 510* 420* 469*   CBG: Recent Labs  Lab 03/01/21 1747 03/01/21 2241 03/02/21 0830 03/02/21 1137 03/02/21 1616  GLUCAP 116* 120* 105* 171* 121*   Hgb A1c Recent Labs    03/02/21 0421  HGBA1C 6.9*   Lipid Profile No results for input(s): CHOL, HDL, LDLCALC, TRIG, CHOLHDL, LDLDIRECT in the last 72 hours. Thyroid function studies No results for input(s): TSH, T4TOTAL, T3FREE, THYROIDAB in the last 72 hours.  Invalid input(s): FREET3 Urinalysis    Component Value Date/Time   COLORURINE YELLOW (A) 03/01/2021 1335   APPEARANCEUR CLEAR (A) 03/01/2021 1335   LABSPEC 1.013 03/01/2021 1335   PHURINE 6.0 03/01/2021 1335   GLUCOSEU >=500 (A)  03/01/2021 1335   HGBUR NEGATIVE 03/01/2021 1335   BILIRUBINUR NEGATIVE 03/01/2021 1335   KETONESUR NEGATIVE 03/01/2021 1335   PROTEINUR NEGATIVE 03/01/2021 1335   NITRITE NEGATIVE 03/01/2021 1335   LEUKOCYTESUR NEGATIVE 03/01/2021 1335    FURTHER DISCHARGE INSTRUCTIONS:   Get Medicines reviewed and adjusted: Please take all your medications with you for your next visit with your Primary MD   Laboratory/radiological data: Please request your Primary MD to go over all hospital tests and procedure/radiological results at the follow up, please ask your Primary MD  to get all Hospital records sent to his/her office.   In some cases, they will be blood work, cultures and biopsy results pending at the time of your discharge. Please request that your primary care M.D. goes through all the records of your hospital data and follows up on these results.   Also Note the following: If you experience worsening of your admission symptoms, develop shortness of breath, life threatening emergency, suicidal or homicidal thoughts you must seek medical attention immediately by calling 911 or calling your MD immediately  if symptoms less severe.   You must read complete instructions/literature along with all the possible adverse reactions/side effects for all the Medicines you take and that have been prescribed to you. Take any new Medicines after you have completely understood and accpet all the possible adverse reactions/side effects.    Do not drive when taking Pain medications or sleeping medications (Benzodaizepines)   Do not take more than prescribed Pain, Sleep and Anxiety Medications. It is not advisable to combine anxiety,sleep and pain medications without talking with your primary care practitioner   Special Instructions: If you have smoked or chewed Tobacco  in the last 2 yrs please stop smoking, stop any regular Alcohol  and or any Recreational drug use.   Wear Seat belts while driving.    Please note: You were cared for by a hospitalist during your hospital stay. Once you are discharged, your primary care physician will handle any further medical issues. Please note that NO REFILLS for any discharge medications will be authorized once you are discharged, as it is imperative that you return to your primary care physician (or establish a relationship with a primary care physician if you do not have one) for your post hospital discharge needs so that they can reassess your need for medications and monitor your lab values.  Time coordinating discharge: 40 minutes  SIGNED:  Marzetta Board, MD, PhD 03/04/2021, 2:02 PM

## 2021-03-06 LAB — CULTURE, BLOOD (ROUTINE X 2)
Culture: NO GROWTH
Culture: NO GROWTH
Special Requests: ADEQUATE

## 2021-03-06 LAB — CULTURE, BLOOD (SINGLE)
Culture: NO GROWTH
Special Requests: ADEQUATE

## 2021-03-07 ENCOUNTER — Encounter: Payer: Self-pay | Admitting: Oncology

## 2021-03-07 ENCOUNTER — Inpatient Hospital Stay: Payer: BC Managed Care – PPO

## 2021-03-07 ENCOUNTER — Inpatient Hospital Stay (HOSPITAL_BASED_OUTPATIENT_CLINIC_OR_DEPARTMENT_OTHER): Payer: BC Managed Care – PPO | Admitting: Hospice and Palliative Medicine

## 2021-03-07 ENCOUNTER — Inpatient Hospital Stay (HOSPITAL_BASED_OUTPATIENT_CLINIC_OR_DEPARTMENT_OTHER): Payer: BC Managed Care – PPO | Admitting: Oncology

## 2021-03-07 ENCOUNTER — Other Ambulatory Visit: Payer: Self-pay

## 2021-03-07 VITALS — BP 107/55 | HR 113 | Temp 101.1°F | Resp 20

## 2021-03-07 DIAGNOSIS — Z801 Family history of malignant neoplasm of trachea, bronchus and lung: Secondary | ICD-10-CM | POA: Diagnosis not present

## 2021-03-07 DIAGNOSIS — D709 Neutropenia, unspecified: Secondary | ICD-10-CM | POA: Diagnosis not present

## 2021-03-07 DIAGNOSIS — C786 Secondary malignant neoplasm of retroperitoneum and peritoneum: Secondary | ICD-10-CM

## 2021-03-07 DIAGNOSIS — C221 Intrahepatic bile duct carcinoma: Secondary | ICD-10-CM | POA: Diagnosis not present

## 2021-03-07 DIAGNOSIS — Z515 Encounter for palliative care: Secondary | ICD-10-CM

## 2021-03-07 DIAGNOSIS — E860 Dehydration: Secondary | ICD-10-CM

## 2021-03-07 DIAGNOSIS — E1165 Type 2 diabetes mellitus with hyperglycemia: Secondary | ICD-10-CM | POA: Diagnosis not present

## 2021-03-07 DIAGNOSIS — R630 Anorexia: Secondary | ICD-10-CM | POA: Diagnosis not present

## 2021-03-07 DIAGNOSIS — R112 Nausea with vomiting, unspecified: Secondary | ICD-10-CM | POA: Diagnosis not present

## 2021-03-07 DIAGNOSIS — M5136 Other intervertebral disc degeneration, lumbar region: Secondary | ICD-10-CM | POA: Diagnosis not present

## 2021-03-07 DIAGNOSIS — Z79899 Other long term (current) drug therapy: Secondary | ICD-10-CM | POA: Diagnosis not present

## 2021-03-07 DIAGNOSIS — R110 Nausea: Secondary | ICD-10-CM

## 2021-03-07 DIAGNOSIS — D649 Anemia, unspecified: Secondary | ICD-10-CM | POA: Diagnosis not present

## 2021-03-07 LAB — CBC WITH DIFFERENTIAL/PLATELET
Abs Immature Granulocytes: 0.37 10*3/uL — ABNORMAL HIGH (ref 0.00–0.07)
Basophils Absolute: 0.1 10*3/uL (ref 0.0–0.1)
Basophils Relative: 0 %
Eosinophils Absolute: 0.1 10*3/uL (ref 0.0–0.5)
Eosinophils Relative: 0 %
HCT: 24.9 % — ABNORMAL LOW (ref 36.0–46.0)
Hemoglobin: 8.2 g/dL — ABNORMAL LOW (ref 12.0–15.0)
Immature Granulocytes: 1 %
Lymphocytes Relative: 5 %
Lymphs Abs: 1.5 10*3/uL (ref 0.7–4.0)
MCH: 28.7 pg (ref 26.0–34.0)
MCHC: 32.9 g/dL (ref 30.0–36.0)
MCV: 87.1 fL (ref 80.0–100.0)
Monocytes Absolute: 2.1 10*3/uL — ABNORMAL HIGH (ref 0.1–1.0)
Monocytes Relative: 6 %
Neutro Abs: 29 10*3/uL — ABNORMAL HIGH (ref 1.7–7.7)
Neutrophils Relative %: 88 %
Platelets: 533 10*3/uL — ABNORMAL HIGH (ref 150–400)
RBC: 2.86 MIL/uL — ABNORMAL LOW (ref 3.87–5.11)
RDW: 16.2 % — ABNORMAL HIGH (ref 11.5–15.5)
WBC: 33.2 10*3/uL — ABNORMAL HIGH (ref 4.0–10.5)
nRBC: 0 % (ref 0.0–0.2)

## 2021-03-07 LAB — COMPREHENSIVE METABOLIC PANEL
ALT: 27 U/L (ref 0–44)
AST: 48 U/L — ABNORMAL HIGH (ref 15–41)
Albumin: 2.5 g/dL — ABNORMAL LOW (ref 3.5–5.0)
Alkaline Phosphatase: 170 U/L — ABNORMAL HIGH (ref 38–126)
Anion gap: 8 (ref 5–15)
BUN: 10 mg/dL (ref 6–20)
CO2: 28 mmol/L (ref 22–32)
Calcium: 8.1 mg/dL — ABNORMAL LOW (ref 8.9–10.3)
Chloride: 97 mmol/L — ABNORMAL LOW (ref 98–111)
Creatinine, Ser: 0.75 mg/dL (ref 0.44–1.00)
GFR, Estimated: >60 mL/min (ref >60)
Glucose, Bld: 155 mg/dL — ABNORMAL HIGH (ref 70–99)
Potassium: 3.4 mmol/L — ABNORMAL LOW (ref 3.5–5.1)
Sodium: 133 mmol/L — ABNORMAL LOW (ref 135–145)
Total Bilirubin: 0.6 mg/dL (ref 0.3–1.2)
Total Protein: 7.4 g/dL (ref 6.5–8.1)

## 2021-03-07 LAB — MAGNESIUM: Magnesium: 2 mg/dL (ref 1.7–2.4)

## 2021-03-07 MED ORDER — SODIUM CHLORIDE 0.9 % IV SOLN: 8.0000 mg | Freq: Once | INTRAVENOUS | Status: DC

## 2021-03-07 MED ORDER — ONDANSETRON HCL 4 MG/2ML IJ SOLN
8.0000 mg | Freq: Once | INTRAMUSCULAR | Status: AC
  Administered 2021-03-07: 8 mg via INTRAVENOUS
  Filled 2021-03-07: qty 4

## 2021-03-07 MED ORDER — POTASSIUM CHLORIDE 20 MEQ/100ML IV SOLN: 20.0000 meq | Freq: Once | INTRAVENOUS | Status: DC

## 2021-03-07 MED ORDER — OXYCODONE HCL 5 MG PO TABS: 5.0000 mg | ORAL_TABLET | Freq: Four times a day (QID) | ORAL | 0 refills | Status: AC | PRN

## 2021-03-07 MED ORDER — HEPARIN SOD (PORK) LOCK FLUSH 100 UNIT/ML IV SOLN
INTRAVENOUS | Status: AC
  Filled 2021-03-07: qty 5

## 2021-03-07 MED ORDER — SODIUM CHLORIDE 0.9 % IV SOLN
Freq: Once | INTRAVENOUS | Status: AC
  Filled 2021-03-07: qty 250

## 2021-03-07 MED ORDER — POTASSIUM CHLORIDE IN NACL 20-0.9 MEQ/L-% IV SOLN
Freq: Once | INTRAVENOUS | Status: DC
  Filled 2021-03-07: qty 1000

## 2021-03-07 MED ORDER — DEXAMETHASONE SODIUM PHOSPHATE 10 MG/ML IJ SOLN
10.0000 mg | Freq: Once | INTRAMUSCULAR | Status: AC
Start: 2021-03-07 — End: 2021-03-07
  Administered 2021-03-07: 10 mg via INTRAVENOUS
  Filled 2021-03-07: qty 1

## 2021-03-07 MED ORDER — SODIUM CHLORIDE 0.9 % IV SOLN
INTRAVENOUS | Status: DC
  Filled 2021-03-07: qty 250

## 2021-03-07 MED ORDER — POTASSIUM CHLORIDE IN NACL 20-0.9 MEQ/L-% IV SOLN
Freq: Once | INTRAVENOUS | Status: AC
  Filled 2021-03-07: qty 1000

## 2021-03-07 MED ORDER — HEPARIN SOD (PORK) LOCK FLUSH 100 UNIT/ML IV SOLN
500.0000 [IU] | Freq: Once | INTRAVENOUS | Status: AC
  Administered 2021-03-07: 500 [IU] via INTRAVENOUS
  Filled 2021-03-07: qty 5

## 2021-03-07 MED ORDER — PROCHLORPERAZINE MALEATE 10 MG PO TABS: 10.0000 mg | ORAL_TABLET | Freq: Four times a day (QID) | ORAL | 0 refills | Status: AC | PRN

## 2021-03-07 MED ORDER — SODIUM CHLORIDE 0.9% FLUSH
10.0000 mL | INTRAVENOUS | Status: DC | PRN
  Administered 2021-03-07: 10 mL via INTRAVENOUS
  Filled 2021-03-07: qty 10

## 2021-03-07 NOTE — Patient Instructions (Signed)
Dehydration, Adult Dehydration is a condition in which there is not enough water or other fluids in the body. This happens when a person loses more fluids than he or she takes in. Important organs, such as the kidneys, brain, and heart, cannot function without a proper amount of fluids. Any loss of fluids from the body can lead todehydration. Dehydration can be mild, moderate, or severe. It should be treated right awayto prevent it from becoming severe. What are the causes? Dehydration may be caused by: Conditions that cause loss of water or other fluids, such as diarrhea, vomiting, or sweating or urinating a lot. Not drinking enough fluids, especially when you are ill or doing activities that require a lot of energy. Other illnesses and conditions, such as fever or infection. Certain medicines, such as medicines that remove excess fluid from the body (diuretics). Lack of safe drinking water. Not being able to get enough water and food. What increases the risk? The following factors may make you more likely to develop this condition: Having a long-term (chronic) illness that has not been treated properly, such as diabetes, heart disease, or kidney disease. Being 14 years of age or older. Having a disability. Living in a place that is high in altitude, where thinner, drier air causes more fluid loss. Doing exercises that put stress on your body for a long time (endurance sports). What are the signs or symptoms? Symptoms of dehydration depend on how severe it is. Mild or moderate dehydration Thirst. Dry lips or dry mouth. Dizziness or light-headedness, especially when standing up from a seated position. Muscle cramps. Dark urine. Urine may be the color of tea. Less urine or tears produced than usual. Headache. Severe dehydration Changes in skin. Your skin may be cold and clammy, blotchy, or pale. Your skin also may not return to normal after being lightly pinched and released. Little or  no tears, urine, or sweat. Changes in vital signs, such as rapid breathing and low blood pressure. Your pulse may be weak or may be faster than 100 beats a minute when you are sitting still. Other changes, such as: Feeling very thirsty. Sunken eyes. Cold hands and feet. Confusion. Being very tired (lethargic) or having trouble waking from sleep. Short-term weight loss. Loss of consciousness. How is this diagnosed? This condition is diagnosed based on your symptoms and a physical exam. You mayhave blood and urine tests to help confirm the diagnosis. How is this treated? Treatment for this condition depends on how severe it is. Treatment should be started right away. Do not wait until dehydration becomes severe. Severe dehydration is an emergency and needs to be treated in a hospital. Mild or moderate dehydration can be treated at home. You may be asked to: Drink more fluids. Drink an oral rehydration solution (ORS). This drink helps restore proper amounts of fluids and salts and minerals in the blood (electrolytes). Severe dehydration can be treated: With IV fluids. By correcting abnormal levels of electrolytes. This is often done by giving electrolytes through a tube that is passed through your nose and into your stomach (nasogastric tube, or NG tube). By treating the underlying cause of dehydration. Follow these instructions at home: Oral rehydration solution If told by your health care provider, drink an ORS: Make an ORS by following instructions on the package. Start by drinking small amounts, about  cup (120 mL) every 5-10 minutes. Slowly increase how much you drink until you have taken the amount recommended by your health care provider. Eating  and drinking        Drink enough clear fluid to keep your urine pale yellow. If you were told to drink an ORS, finish the ORS first and then start slowly drinking other clear fluids. Drink fluids such as: Water. Do not drink only water.  Doing that can lead to hyponatremia, which is having too little salt (sodium) in the body. Water from ice chips you suck on. Fruit juice that you have added water to (diluted fruit juice). Low-calorie sports drinks. Eat foods that contain a healthy balance of electrolytes, such as bananas, oranges, potatoes, tomatoes, and spinach. Do not drink alcohol. Avoid the following: Drinks that contain a lot of sugar. These include high-calorie sports drinks, fruit juice that is not diluted, and soda. Caffeine. Foods that are greasy or contain a lot of fat or sugar. General instructions Take over-the-counter and prescription medicines only as told by your health care provider. Do not take sodium tablets. Doing that can lead to having too much sodium in the body (hypernatremia). Return to your normal activities as told by your health care provider. Ask your health care provider what activities are safe for you. Keep all follow-up visits as told by your health care provider. This is important. Contact a health care provider if: You have muscle cramps, pain, or discomfort, such as: Pain in your abdomen and the pain gets worse or stays in one area (localizes). Stiff neck. You have a rash. You are more irritable than usual. You are sleepier or have a harder time waking than usual. You feel weak or dizzy. You feel very thirsty. Get help right away if you have: Any symptoms of severe dehydration. Symptoms of vomiting, such as: You cannot eat or drink without vomiting. Vomiting gets worse or does not go away. Vomit includes blood or green matter (bile). Symptoms that get worse with treatment. A fever. A severe headache. Problems with urination or bowel movements, such as: Diarrhea that gets worse or does not go away. Blood in your stool (feces). This may cause stool to look black and tarry. Not urinating, or urinating only a small amount of very dark urine, within 6-8 hours. Trouble  breathing. These symptoms may represent a serious problem that is an emergency. Do not wait to see if the symptoms will go away. Get medical help right away. Call your local emergency services (911 in the [place] Do not drive yourself to the hospital. Summary Dehydration is a condition in which there is not enough water or other fluids in the body. This happens when a person loses more fluids than he or she takes in. Treatment for this condition depends on how severe it is. Treatment should be started right away. Do not wait until dehydration becomes severe. Drink enough clear fluid to keep your urine pale yellow. If you were told to drink an oral rehydration solution (ORS), finish the ORS first and then start slowly drinking other clear fluids. Take over-the-counter and prescription medicines only as told by your health care provider. Get help right away if you have any symptoms of severe dehydration. This information is not intended to replace advice given to you by your health care provider. Make sure you discuss any questions you have with your healthcare provider. Document Revised: 04/22/2019 Document Reviewed: 04/22/2019 Elsevier Patient Education  [place].   Hypokalemia Hypokalemia means that the amount of potassium in the blood is lower than normal. Potassium is a chemical (electrolyte) that helps regulate the amount of  fluid in the body. It also stimulates muscle tightening (contraction) and helps nerves work properly. Normally, most of the body's potassium is inside cells, and only a very small amount is in the blood. Because the amount in the blood is so small, minorchanges to potassium levels in the blood can be life-threatening. What are the causes? This condition may be caused by: Antibiotic medicine. Diarrhea or vomiting. Taking too much of a medicine that helps you have a bowel movement (laxative) can cause diarrhea and lead to hypokalemia. Chronic kidney disease  (CKD). Medicines that help the body get rid of excess fluid (diuretics). Eating disorders, such as bulimia. Low magnesium levels in the body. Sweating a lot. What are the signs or symptoms? Symptoms of this condition include: Weakness. Constipation. Fatigue. Muscle cramps. Mental confusion. Skipped heartbeats or irregular heartbeat (palpitations). Tingling or numbness. How is this diagnosed? This condition is diagnosed with a blood test. How is this treated? This condition may be treated by: Taking potassium supplements by mouth. Adjusting the medicines that you take. Eating more foods that contain a lot of potassium. If your potassium level is very low, you may need to get potassium through anIV and be monitored in the hospital. Follow these instructions at home:  Take over-the-counter and prescription medicines only as told by your health care provider. This includes vitamins and supplements. Eat a healthy diet. A healthy diet includes fresh fruits and vegetables, whole grains, healthy fats, and lean proteins. If instructed, eat more foods that contain a lot of potassium. This includes: Nuts, such as peanuts and pistachios. Seeds, such as sunflower seeds and pumpkin seeds. Peas, lentils, and lima beans. Whole grain and bran cereals and breads. Fresh fruits and vegetables, such as apricots, avocado, bananas, cantaloupe, kiwi, oranges, tomatoes, asparagus, and potatoes. Orange juice. Tomato juice. Red meats. Yogurt. Keep all follow-up visits as told by your health care provider. This is important. Contact a health care provider if you: Have weakness that gets worse. Feel your heart pounding or racing. Vomit. Have diarrhea. Have diabetes (diabetes mellitus) and you have trouble keeping your blood sugar (glucose) in your target range. Get help right away if you: Have chest pain. Have shortness of breath. Have vomiting or diarrhea that lasts for more than 2  days. Faint. Summary Hypokalemia means that the amount of potassium in the blood is lower than normal. This condition is diagnosed with a blood test. Hypokalemia may be treated by taking potassium supplements, adjusting the medicines that you take, or eating more foods that are high in potassium. If your potassium level is very low, you may need to get potassium through an IV and be monitored in the hospital. This information is not intended to replace advice given to you by your health care provider. Make sure you discuss any questions you have with your healthcare provider. Document Revised: 04/22/2018 Document Reviewed: 04/22/2018 Elsevier Patient Education  [place].

## 2021-03-07 NOTE — Progress Notes (Signed)
Lauren Lindsey  Telephone:([phone] [phone]   Name: Lauren Lindsey Date: 03/07/2021 MRN: [id number]  DOB: [birth year]  Patient Care Team: [name], MD as PCP - General (Family Medicine) Patient, No Pcp Per (Inactive) (General Practice) [name], RN as Oncology Nurse Navigator [name], [name], MD as Consulting Physician (Oncology)    REASON FOR CONSULTATION: Lauren Lindsey is a 60 y.o. 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  Patient was recently hospitalized 03/01/2021-03/04/2021 with SIRS from unknown source.  While hospitalized, an abdominal MRI revealed significant disease progression in the liver.  Palliative care was consulted help address goals.  SOCIAL HISTORY:     reports that she has never smoked. She has never used smokeless tobacco. She reports that she does not drink alcohol and does not use drugs.  Patient is married and lives at home with her husband.  She has no children.  [place].  ADVANCE DIRECTIVES:  On file  CODE STATUS:   PAST MEDICAL HISTORY: Past Medical History:  Diagnosis Date   Abnormal Pap smear of cervix    Anemia    h/o   Diabetes mellitus without complication ([place])    Intrahepatic cholangiocarcinoma ([place])     PAST SURGICAL HISTORY:  Past Surgical History:  Procedure Laterality Date   CHOLECYSTECTOMY     COLONOSCOPY WITH PROPOFOL N/A 06/15/2020   Procedure: COLONOSCOPY WITH PROPOFOL;  Surgeon: Lin Landsman, MD;  Location: [facility];  Service: Endoscopy;  Laterality: N/A;  Diabetic - oral meds   ENDOMETRIAL ABLATION     HYSTEROSCOPY WITH D & C     LEEP     PORTA CATH INSERTION N/A 01/04/2021   Procedure: PORTA CATH INSERTION;  Surgeon: Algernon Huxley, MD;  Location: [facility];  Service: Cardiovascular;  Laterality:  N/A;    HEMATOLOGY/ONCOLOGY HISTORY:  Oncology History  Intrahepatic cholangiocarcinoma ([place])  05/31/2020 Initial Diagnosis   Intrahepatic cholangiocarcinoma ([place])    08/04/2020 Cancer Staging   Staging form: Intrahepatic Bile Duct, AJCC 8th Edition - Clinical stage from 08/04/2020: Stage IV (cT1b, cN0, pM1) - Signed by Lloyd Huger, MD on 08/04/2020    08/31/2020 -  Chemotherapy    Patient is on Treatment Plan: CISPLATIN D1 + GEMCITABINE D1,8 Q21D         ALLERGIES:  has No Known Allergies.  MEDICATIONS:  Current Outpatient Medications  Medication Sig Dispense Refill   acetaminophen (TYLENOL) 500 MG tablet Take 500-1,000 mg by mouth every 6 (six) hours as needed for mild pain or moderate pain.     levofloxacin (LEVAQUIN) 500 MG tablet Take 1 tablet (500 mg total) by mouth daily. 7 tablet 0   lidocaine-prilocaine (EMLA) cream Apply 1 application topically as needed. 30 g 2   metFORMIN (GLUCOPHAGE) 500 MG tablet Take 250 mg by mouth daily as needed (high blood sugar).     ondansetron (ZOFRAN) 8 MG tablet Take 1 tablet (8 mg total) by mouth every 8 (eight) hours as needed for nausea or vomiting. 30 tablet 2   No current facility-administered medications for this visit.   Facility-Administered Medications Ordered in Other Visits  Medication Dose Route Frequency Provider Last Rate Last Admin   heparin lock flush 100 unit/mL  500 Units Intravenous Once Lloyd Huger, MD       heparin lock flush 100 unit/mL  500  Units Intravenous Once Lloyd Huger, MD       sodium chloride flush (NS) 0.9 % injection 10 mL  10 mL Intravenous PRN Lloyd Huger, MD   10 mL at 01/17/21 0854   sodium chloride flush (NS) 0.9 % injection 10 mL  10 mL Intravenous PRN Lloyd Huger, MD   10 mL at 03/07/21 0855    VITAL SIGNS: LMP 06/26/2019  There were no vitals filed for this visit.  Estimated body mass index is 25.46 kg/m as calculated from the following:   Height as  of 03/01/21: '5\' 5"'  (1.651 m).   Weight as of 03/01/21: 153 lb (69.4 kg).  LABS: CBC:    Component Value Date/Time   WBC 33.2 (H) 03/07/2021 0848   HGB 8.2 (L) 03/07/2021 0848   HGB 12.4 01/31/2012 0959   HCT 24.9 (L) 03/07/2021 0848   HCT 37.9 01/31/2012 0959   PLT 533 (H) 03/07/2021 0848   PLT 330 01/31/2012 0959   MCV 87.1 03/07/2021 0848   MCV 82 01/31/2012 0959   NEUTROABS 29.0 (H) 03/07/2021 0848   LYMPHSABS 1.5 03/07/2021 0848   MONOABS 2.1 (H) 03/07/2021 0848   EOSABS 0.1 03/07/2021 0848   BASOSABS 0.1 03/07/2021 0848   Comprehensive Metabolic Panel:    Component Value Date/Time   NA 133 (L) 03/07/2021 0848   K 3.4 (L) 03/07/2021 0848   CL 97 (L) 03/07/2021 0848   CO2 28 03/07/2021 0848   BUN 10 03/07/2021 0848   CREATININE 0.75 03/07/2021 0848   GLUCOSE 155 (H) 03/07/2021 0848   CALCIUM 8.1 (L) 03/07/2021 0848   AST 48 (H) 03/07/2021 0848   ALT 27 03/07/2021 0848   ALKPHOS 170 (H) 03/07/2021 0848   BILITOT 0.6 03/07/2021 0848   PROT 7.4 03/07/2021 0848   ALBUMIN 2.5 (L) 03/07/2021 0848    RADIOGRAPHIC STUDIES: CT ABDOMEN PELVIS WO CONTRAST  Result Date: 03/01/2021 CLINICAL DATA:  Low-grade fever, nausea and vomiting for several days. History of intrahepatic cholangiocarcinoma. EXAM: CT ABDOMEN AND PELVIS WITHOUT CONTRAST TECHNIQUE: Multidetector CT imaging of the abdomen and pelvis was performed following the standard protocol without IV contrast. COMPARISON:  MRI abdomen 12/01/2020. FINDINGS: Lower chest: Lung bases clear.  No pleural or pericardial effusion. Hepatobiliary: Lack of IV contrast limits evaluation of solid abdominal viscera. The patient's cholangiocarcinoma better seen on the prior studies but there is an area of abnormal attenuation in the inferior right hepatic lobe measuring approximately 4.3 cm on image 30 of series 2. The gallbladder has been removed. Biliary tree is negative. Pancreas: Unremarkable. No pancreatic ductal dilatation or surrounding  inflammatory changes. Spleen: Normal in size without focal abnormality. Adrenals/Urinary Tract: Adrenal glands are unremarkable. No renal calculi, worrisome lesion, or hydronephrosis. Parapelvic renal cysts are seen and more prominent on the right. Bladder is unremarkable. Stomach/Bowel: Stomach is within normal limits. Appendix appears normal. No evidence of bowel wall thickening, distention, or inflammatory changes. Vascular/Lymphatic: No significant vascular findings are present. No enlarged abdominal or pelvic lymph nodes. 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 Reproductive: Uterus and bilateral adnexa are unremarkable. Other: None. Musculoskeletal: No acute or focal abnormality. Degenerative disc disease L3-4 and L4-5 noted. IMPRESSION: No acute abnormality abdomen or pelvis. Known cholangiocarcinoma is seen but better visualized on the comparison MRI. Electronically Signed   By: Inge Rise M.D.   On: 03/01/2021 14:21   CT HEAD WO CONTRAST  Result Date: 03/04/2021 CLINICAL DATA:  Headache, new or worsening. EXAM: CT  HEAD WITHOUT CONTRAST TECHNIQUE: Contiguous axial images were obtained from the base of the skull through the vertex without intravenous contrast. COMPARISON:  None. FINDINGS: Brain: No evidence of acute infarction, hemorrhage, hydrocephalus, extra-axial collection or mass lesion/mass effect. Vascular: No hyperdense vessel or unexpected calcification. Skull: Normal. Negative for fracture or focal lesion. Sinuses/Orbits: No acute finding. IMPRESSION: Negative head CT. Electronically Signed   By: Monte Fantasia M.D.   On: 03/04/2021 09:23   MR LIVER W WO CONTRAST  Result Date: 03/03/2021 CLINICAL DATA:  Cholangiocarcinoma. On chemotherapy. Vomiting. Fever. Nausea EXAM: MRI ABDOMEN WITHOUT AND WITH CONTRAST TECHNIQUE: Multiplanar multisequence MR imaging of the abdomen was performed both before and after the administration of intravenous contrast. CONTRAST:  7.92m GADAVIST  GADOBUTROL 1 MMOL/ML IV SOLN COMPARISON:  03/01/2021 noncontrast CT.  Prior MRI of 12/01/2020. FINDINGS: Lower chest: Normal heart size without pericardial or pleural effusion. Anterior right upper lung 8 mm nodule on 54/19 is only seen on overview images but is similar to 52/18 from prior MRI. Hepatobiliary: Dominant pericholecystic right hepatic lobe (segment 5/6) mass is significantly progressive. Example 5.3 x 4.4 cm on 43/7. On the order of 1.2 x 1.9 cm on the prior exam. New satellite lesions within the more inferior right hepatic lobe, including at 7 mm on 59/17. Gallbladder not visualized, absent by clinical history. No biliary duct dilatation. Pancreas:  Normal, without mass or ductal dilatation. Spleen:  Normal in size, without focal abnormality. Adrenals/Urinary Tract: Normal adrenal glands. Tiny bilateral renal lesions which are too small to characterize but likely cysts. No hydronephrosis. Stomach/Bowel: Normal stomach and abdominal bowel loops. Vascular/Lymphatic: Aortic atherosclerosis. Left IVC. Porta hepatis node of 1.2 cm on 17/6 is felt to be enlarged compared to the prior, where it measures on the order of 8 mm. Other:  No ascites.  No evidence of omental or peritoneal disease. Musculoskeletal: No acute osseous abnormality. IMPRESSION: 1. Significant progression of dominant right hepatic lobe mass and satellite liver lesions. 2. Enlargement of a porta hepatis node, which is borderline sized, suspicious for nodal metastasis. 3. 8 mm right lung nodule, corresponding to a calcified granuloma on the 07/13/2020 PET. 4.  Aortic Atherosclerosis (ICD10-I70.0). Electronically Signed   By: KAbigail MiyamotoM.D.   On: 03/03/2021 07:27   [place] Img Lower Bilateral (DVT)  Result Date: 03/04/2021 CLINICAL DATA:  60year old female with a history of leg pain EXAM: BILATERAL LOWER EXTREMITY VENOUS DOPPLER ULTRASOUND TECHNIQUE: Gray-scale sonography with graded compression, as well as color Doppler and duplex  ultrasound were performed to evaluate the lower extremity deep venous systems from the level of the common femoral vein and including the common femoral, femoral, profunda femoral, popliteal and calf veins including the posterior tibial, peroneal and gastrocnemius veins when visible. The superficial great saphenous vein was also interrogated. Spectral Doppler was utilized to evaluate flow at rest and with distal augmentation maneuvers in the common femoral, femoral and popliteal veins. COMPARISON:  None. FINDINGS: RIGHT LOWER EXTREMITY Common Femoral Vein: No evidence of thrombus. Normal compressibility, respiratory phasicity and response to augmentation. Saphenofemoral Junction: No evidence of thrombus. Normal compressibility and flow on color Doppler imaging. Profunda Femoral Vein: No evidence of thrombus. Normal compressibility and flow on color Doppler imaging. Femoral Vein: No evidence of thrombus. Normal compressibility, respiratory phasicity and response to augmentation. Popliteal Vein: No evidence of thrombus. Normal compressibility, respiratory phasicity and response to augmentation. Calf Veins: No evidence of thrombus. Normal compressibility and flow on color Doppler imaging. Superficial Great Saphenous Vein:  No evidence of thrombus. Normal compressibility and flow on color Doppler imaging. Other Findings:  None. LEFT LOWER EXTREMITY Common Femoral Vein: No evidence of thrombus. Normal compressibility, respiratory phasicity and response to augmentation. Saphenofemoral Junction: No evidence of thrombus. Normal compressibility and flow on color Doppler imaging. Profunda Femoral Vein: No evidence of thrombus. Normal compressibility and flow on color Doppler imaging. Femoral Vein: No evidence of thrombus. Normal compressibility, respiratory phasicity and response to augmentation. Popliteal Vein: No evidence of thrombus. Normal compressibility, respiratory phasicity and response to augmentation. Calf Veins: No  evidence of thrombus. Normal compressibility and flow on color Doppler imaging. Superficial Great Saphenous Vein: No evidence of thrombus. Normal compressibility and flow on color Doppler imaging. Other Findings:  None. IMPRESSION: Sonographic survey of the bilateral lower extremities negative for DVT Electronically Signed   By: Corrie Mckusick D.O.   On: 03/04/2021 11:41   DG Chest Port 1 View  Result Date: 03/01/2021 CLINICAL DATA:  Questionable sepsis. EXAM: PORTABLE CHEST 1 VIEW COMPARISON:  No recent prior. FINDINGS: Central line noted with tip over cavoatrial junction. Heart size normal. Low lung volumes. Questionable small density noted over the right lung base. This could represent a small focal area of atelectasis or small rounded infiltrate. To exclude a pulmonary nodule follow-up exam suggested to demonstrate resolution. No pleural effusion or pneumothorax. Degenerative change thoracic spine and both shoulders. IMPRESSION: 1.  Central line noted with tip over cavoatrial junction. 2. Questionable density noted over the right lung base. This could represent a small focal area of atelectasis or small rounded infiltrate. To exclude a pulmonary nodule follow-up PA and lateral chest x-ray suggested to demonstrate resolution. Electronically Signed   By: Marcello Moores  Register   On: 03/01/2021 14:29    PERFORMANCE STATUS (ECOG) : 2 - Symptomatic, <50% confined to bed  Review of Systems Unless otherwise noted, a complete review of systems is negative.  Physical Exam General: NAD Pulmonary: Unlabored Extremities: no edema, no joint deformities Skin: no rashes Neurological: Weakness but otherwise nonfocal  IMPRESSION: I met with patient and husband today.  I introduced palliative care services and attempted to establish therapeutic rapport.  Since discharging home from the hospital, patient has remained weak with persistent nausea and poor oral intake.  Patient says that she spoke with Dr. [name] in  the hospital and understands that there is evidence of disease progression on imaging.  However, her husband had yet to hear that news.  We discussed disease progression in the context of a stage IV, incurable malignancy.  Second line FOLFOX has been offered but patient's declining performance status and overall symptom burden is such that a focus on quality of life and foregoing further treatment would not be unreasonable.  We discussed the option for further chemotherapy versus transitioning to a comfort focus.  Both patient and husband are unsure what they would want to do.  Patient did mention option of a second opinion.  I encouraged them to consider options and I can follow-up when they have made a decision.  Plan is for patient to receive IV fluids and potassium today.  She mentioned a desire to obtain medical records as she is planning a trip back to [place].  Discussed CODE STATUS.  Patient does have ACP documents on file.  They seem to be leaning towards DNR/DNI but wanted to think about decision-making.  Symptomatically, she has persistent nausea unrelieved by Zofran alone.  We will start as needed prochlorperazine.  She has occasional headaches and  generalized pain.  Will start as needed oxycodone.  PLAN: -Patient has been considering further treatment -Start prochlorperazine as needed for nausea -Start oxycodone as needed for pain -RTC TBD   Patient expressed understanding and was in agreement with this plan. She also understands that She can call the clinic at any time with any questions, concerns, or complaints.     Time Total: 20 minutes  Visit consisted of counseling and education dealing with the complex and emotionally intense issues of symptom management and palliative care in the setting of serious and potentially life-threatening illness.Greater than 50%  of this time was spent counseling and coordinating care related to the above assessment and plan.  Signed  by: Altha Harm, PhD, NP-C

## 2021-03-07 NOTE — Progress Notes (Signed)
[place]  [phone] [phone]) [phone]  ID: Lauren Lindsey OB: 01/01/1961  MR#: [id number]  XTK#:[id number]  Patient Care Team: [name], MD as PCP - General (Family Medicine) Patient, No Pcp Per (Inactive) (General Practice) [name], RN as Oncology Nurse Navigator [name], [name], MD as Consulting Physician (Oncology)   CHIEF COMPLAINT: Stage IV intrahepatic cholangiocarcinoma.  INTERVAL HISTORY: Patient returns to clinic today for follow-up after recent hospitalization.  Treatment on 06/01 was cancelled secondary to anemia, and she was given 1 unit of PRBC instead.  She missed treatment on 02/28/21 secondary to weakness and fevers. She was given IV fluids, and a prescription for PO Levaquin was sent to her pharmacy.   She was recently admitted to [facility] from 06/10 through 06/12 for fever, nausea, vomiting, and poor oral intake.   MRI of ABD shows progression of cholangiocarcinoma.   Today, she presents with her husband to discuss additional treatment options versus palliative care.  They did receive the results of her recent imaging while she was admitted to the hospital by Dr. [name].  They were under the assumption that the chemotherapy was helping. She is interested in knowing her "time-left" because she wants to go home to [place].  She would like her medical records as well.  Review of Systems  Constitutional:  Positive for fever and malaise/fatigue. Negative for weight loss.  HENT:  Negative for congestion and hearing loss.   Eyes:  Negative for blurred vision and double vision.  Respiratory:  Negative for cough and shortness of breath.   Cardiovascular:  Negative for chest pain and palpitations.  Gastrointestinal:  Positive for nausea. Negative for abdominal pain, constipation, diarrhea and vomiting.  Genitourinary:  Negative for frequency and urgency.  Skin:  Negative for rash.  Neurological:  Negative for  dizziness, tingling and headaches.  Endo/Heme/Allergies:  Does not bruise/bleed easily.  Psychiatric/Behavioral:  Negative for depression. The patient is not nervous/anxious and does not have insomnia.        Reports sadness, but denies depression    As per HPI. Otherwise, a complete review of systems is negative.  PAST MEDICAL HISTORY: Past Medical History:  Diagnosis Date   Abnormal Pap smear of cervix    Anemia    h/o   Diabetes mellitus without complication ([place])    Intrahepatic cholangiocarcinoma ([place])     PAST SURGICAL HISTORY: Past Surgical History:  Procedure Laterality Date   CHOLECYSTECTOMY     COLONOSCOPY WITH PROPOFOL N/A 06/15/2020   Procedure: COLONOSCOPY WITH PROPOFOL;  Surgeon: Lin Landsman, MD;  Location: [facility];  Service: Endoscopy;  Laterality: N/A;  Diabetic - oral meds   ENDOMETRIAL ABLATION     HYSTEROSCOPY WITH D & C     LEEP     PORTA CATH INSERTION N/A 01/04/2021   Procedure: PORTA CATH INSERTION;  Surgeon: Algernon Huxley, MD;  Location: [facility];  Service: Cardiovascular;  Laterality: N/A;    FAMILY HISTORY: Family History  Problem Relation Age of Onset   Bleeding Disorder Mother    Biliary Cirrhosis Mother    Liver cancer Mother    Hypertension Mother    Diabetes Father    Lung cancer Father    Breast cancer Neg Hx     ADVANCED DIRECTIVES (Y/N):  N  HEALTH MAINTENANCE: Social History   Tobacco Use   Smoking status: Never   Smokeless tobacco: Never  Vaping Use   Vaping Use: Never  used  Substance Use Topics   Alcohol use: No   Drug use: No   No Known Allergies  Current Outpatient Medications  Medication Sig Dispense Refill   acetaminophen (TYLENOL) 500 MG tablet Take 500-1,000 mg by mouth every 6 (six) hours as needed for mild pain or moderate pain.     levofloxacin (LEVAQUIN) 500 MG tablet Take 1 tablet (500 mg total) by mouth daily. 7 tablet 0   lidocaine-prilocaine (EMLA) cream Apply 1 application  topically as needed. 30 g 2   metFORMIN (GLUCOPHAGE) 500 MG tablet Take 250 mg by mouth daily as needed (high blood sugar).     ondansetron (ZOFRAN) 8 MG tablet Take 1 tablet (8 mg total) by mouth every 8 (eight) hours as needed for nausea or vomiting. 30 tablet 2   No current facility-administered medications for this visit.   Facility-Administered Medications Ordered in Other Visits  Medication Dose Route Frequency Provider Last Rate Last Admin   heparin lock flush 100 unit/mL  500 Units Intravenous Once Lloyd Huger, MD       heparin lock flush 100 unit/mL  500 Units Intravenous Once Lloyd Huger, MD       sodium chloride flush (NS) 0.9 % injection 10 mL  10 mL Intravenous PRN Lloyd Huger, MD   10 mL at 01/17/21 0854   sodium chloride flush (NS) 0.9 % injection 10 mL  10 mL Intravenous PRN Lloyd Huger, MD        OBJECTIVE: Vitals:   03/07/21 0923  BP: (!) 107/55  Pulse: (!) 113  Resp: 20  Temp: (!) 101.1 F (38.4 C)      There is no height or weight on file to calculate BMI.    ECOG FS:1 - Symptomatic but completely ambulatory   Physical Exam Constitutional:      Appearance: Normal appearance.  HENT:     Head: Normocephalic and atraumatic.  Eyes:     Pupils: Pupils are equal, round, and reactive to light.  Cardiovascular:     Rate and Rhythm: Normal rate and regular rhythm.     Heart sounds: Normal heart sounds. No murmur heard. Pulmonary:     Effort: Pulmonary effort is normal.     Breath sounds: Normal breath sounds. No wheezing.  Abdominal:     General: Bowel sounds are normal. There is no distension.     Palpations: Abdomen is soft.     Tenderness: There is no abdominal tenderness.  Musculoskeletal:        General: Normal range of motion.     Cervical back: Normal range of motion.  Skin:    General: Skin is warm and dry.     Coloration: Skin is jaundiced.     Findings: No rash.  Neurological:     Mental Status: She is alert and  oriented to person, place, and time.  Psychiatric:        Judgment: Judgment normal.    LAB RESULTS:  Lab Results  Component Value Date   NA 135 03/04/2021   K 3.3 (L) 03/04/2021   CL 101 03/04/2021   CO2 27 03/04/2021   GLUCOSE 127 (H) 03/04/2021   BUN 7 03/04/2021   CREATININE 0.63 03/04/2021   CALCIUM 8.1 (L) 03/04/2021   PROT 6.8 03/03/2021   ALBUMIN 2.4 (L) 03/03/2021   AST 23 03/03/2021   ALT 15 03/03/2021   ALKPHOS 95 03/03/2021   BILITOT 1.0 03/03/2021   GFRNONAA >60 03/04/2021  GFRAA >60 05/23/2020    Lab Results  Component Value Date   WBC 22.9 (H) 03/04/2021   NEUTROABS 16.3 (H) 03/03/2021   HGB 8.1 (L) 03/04/2021   HCT 24.6 (L) 03/04/2021   MCV 87.5 03/04/2021   PLT 469 (H) 03/04/2021     STUDIES: CT ABDOMEN PELVIS WO CONTRAST  Result Date: 03/01/2021 CLINICAL DATA:  Low-grade fever, nausea and vomiting for several days. History of intrahepatic cholangiocarcinoma. EXAM: CT ABDOMEN AND PELVIS WITHOUT CONTRAST TECHNIQUE: Multidetector CT imaging of the abdomen and pelvis was performed following the standard protocol without IV contrast. COMPARISON:  MRI abdomen 12/01/2020. FINDINGS: Lower chest: Lung bases clear.  No pleural or pericardial effusion. Hepatobiliary: Lack of IV contrast limits evaluation of solid abdominal viscera. The patient's cholangiocarcinoma better seen on the prior studies but there is an area of abnormal attenuation in the inferior right hepatic lobe measuring approximately 4.3 cm on image 30 of series 2. The gallbladder has been removed. Biliary tree is negative. Pancreas: Unremarkable. No pancreatic ductal dilatation or surrounding inflammatory changes. Spleen: Normal in size without focal abnormality. Adrenals/Urinary Tract: Adrenal glands are unremarkable. No renal calculi, worrisome lesion, or hydronephrosis. Parapelvic renal cysts are seen and more prominent on the right. Bladder is unremarkable. Stomach/Bowel: Stomach is within normal  limits. Appendix appears normal. No evidence of bowel wall thickening, distention, or inflammatory changes. Vascular/Lymphatic: No significant vascular findings are present. No enlarged abdominal or pelvic lymph nodes. 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 Reproductive: Uterus and bilateral adnexa are unremarkable. Other: None. Musculoskeletal: No acute or focal abnormality. Degenerative disc disease L3-4 and L4-5 noted. IMPRESSION: No acute abnormality abdomen or pelvis. Known cholangiocarcinoma is seen but better visualized on the comparison MRI. Electronically Signed   By: Inge Rise M.D.   On: 03/01/2021 14:21   CT HEAD WO CONTRAST  Result Date: 03/04/2021 CLINICAL DATA:  Headache, new or worsening. EXAM: CT HEAD WITHOUT CONTRAST TECHNIQUE: Contiguous axial images were obtained from the base of the skull through the vertex without intravenous contrast. COMPARISON:  None. FINDINGS: Brain: No evidence of acute infarction, hemorrhage, hydrocephalus, extra-axial collection or mass lesion/mass effect. Vascular: No hyperdense vessel or unexpected calcification. Skull: Normal. Negative for fracture or focal lesion. Sinuses/Orbits: No acute finding. IMPRESSION: Negative head CT. Electronically Signed   By: Monte Fantasia M.D.   On: 03/04/2021 09:23   MR LIVER W WO CONTRAST  Result Date: 03/03/2021 CLINICAL DATA:  Cholangiocarcinoma. On chemotherapy. Vomiting. Fever. Nausea EXAM: MRI ABDOMEN WITHOUT AND WITH CONTRAST TECHNIQUE: Multiplanar multisequence MR imaging of the abdomen was performed both before and after the administration of intravenous contrast. CONTRAST:  7.54mL GADAVIST GADOBUTROL 1 MMOL/ML IV SOLN COMPARISON:  03/01/2021 noncontrast CT.  Prior MRI of 12/01/2020. FINDINGS: Lower chest: Normal heart size without pericardial or pleural effusion. Anterior right upper lung 8 mm nodule on 54/19 is only seen on overview images but is similar to 52/18 from prior MRI. Hepatobiliary:  Dominant pericholecystic right hepatic lobe (segment 5/6) mass is significantly progressive. Example 5.3 x 4.4 cm on 43/7. On the order of 1.2 x 1.9 cm on the prior exam. New satellite lesions within the more inferior right hepatic lobe, including at 7 mm on 59/17. Gallbladder not visualized, absent by clinical history. No biliary duct dilatation. Pancreas:  Normal, without mass or ductal dilatation. Spleen:  Normal in size, without focal abnormality. Adrenals/Urinary Tract: Normal adrenal glands. Tiny bilateral renal lesions which are too small to characterize but likely cysts. No hydronephrosis.  Stomach/Bowel: Normal stomach and abdominal bowel loops. Vascular/Lymphatic: Aortic atherosclerosis. Left IVC. Porta hepatis node of 1.2 cm on 17/6 is felt to be enlarged compared to the prior, where it measures on the order of 8 mm. Other:  No ascites.  No evidence of omental or peritoneal disease. Musculoskeletal: No acute osseous abnormality. IMPRESSION: 1. Significant progression of dominant right hepatic lobe mass and satellite liver lesions. 2. Enlargement of a porta hepatis node, which is borderline sized, suspicious for nodal metastasis. 3. 8 mm right lung nodule, corresponding to a calcified granuloma on the 07/13/2020 PET. 4.  Aortic Atherosclerosis (ICD10-I70.0). Electronically Signed   By: Abigail Miyamoto M.D.   On: 03/03/2021 07:27   US Venous Img Lower Bilateral (DVT)  Result Date: 03/04/2021 CLINICAL DATA:  60 year old female with a history of leg pain EXAM: BILATERAL LOWER EXTREMITY VENOUS DOPPLER ULTRASOUND TECHNIQUE: Gray-scale sonography with graded compression, as well as color Doppler and duplex ultrasound were performed to evaluate the lower extremity deep venous systems from the level of the common femoral vein and including the common femoral, femoral, profunda femoral, popliteal and calf veins including the posterior tibial, peroneal and gastrocnemius veins when visible. The superficial great  saphenous vein was also interrogated. Spectral Doppler was utilized to evaluate flow at rest and with distal augmentation maneuvers in the common femoral, femoral and popliteal veins. COMPARISON:  None. FINDINGS: RIGHT LOWER EXTREMITY Common Femoral Vein: No evidence of thrombus. Normal compressibility, respiratory phasicity and response to augmentation. Saphenofemoral Junction: No evidence of thrombus. Normal compressibility and flow on color Doppler imaging. Profunda Femoral Vein: No evidence of thrombus. Normal compressibility and flow on color Doppler imaging. Femoral Vein: No evidence of thrombus. Normal compressibility, respiratory phasicity and response to augmentation. Popliteal Vein: No evidence of thrombus. Normal compressibility, respiratory phasicity and response to augmentation. Calf Veins: No evidence of thrombus. Normal compressibility and flow on color Doppler imaging. Superficial Great Saphenous Vein: No evidence of thrombus. Normal compressibility and flow on color Doppler imaging. Other Findings:  None. LEFT LOWER EXTREMITY Common Femoral Vein: No evidence of thrombus. Normal compressibility, respiratory phasicity and response to augmentation. Saphenofemoral Junction: No evidence of thrombus. Normal compressibility and flow on color Doppler imaging. Profunda Femoral Vein: No evidence of thrombus. Normal compressibility and flow on color Doppler imaging. Femoral Vein: No evidence of thrombus. Normal compressibility, respiratory phasicity and response to augmentation. Popliteal Vein: No evidence of thrombus. Normal compressibility, respiratory phasicity and response to augmentation. Calf Veins: No evidence of thrombus. Normal compressibility and flow on color Doppler imaging. Superficial Great Saphenous Vein: No evidence of thrombus. Normal compressibility and flow on color Doppler imaging. Other Findings:  None. IMPRESSION: Sonographic survey of the bilateral lower extremities negative for DVT  Electronically Signed   By: Corrie Mckusick D.O.   On: 03/04/2021 11:41   DG Chest Port 1 View  Result Date: 03/01/2021 CLINICAL DATA:  Questionable sepsis. EXAM: PORTABLE CHEST 1 VIEW COMPARISON:  No recent prior. FINDINGS: Central line noted with tip over cavoatrial junction. Heart size normal. Low lung volumes. Questionable small density noted over the right lung base. This could represent a small focal area of atelectasis or small rounded infiltrate. To exclude a pulmonary nodule follow-up exam suggested to demonstrate resolution. No pleural effusion or pneumothorax. Degenerative change thoracic spine and both shoulders. IMPRESSION: 1.  Central line noted with tip over cavoatrial junction. 2. Questionable density noted over the right lung base. This could represent a small focal area of atelectasis or small rounded infiltrate.  To exclude a pulmonary nodule follow-up PA and lateral chest x-ray suggested to demonstrate resolution. Electronically Signed   By: Marcello Moores  Register   On: 03/01/2021 14:29    ASSESSMENT: Stage IV intrahepatic cholangiocarcinoma.  PLAN:   1.  Stage IV intrahepatic cholangiocarcinoma:  Incidental finding on routine cholecystectomy.  PET scan results from July 13, 2020 as well as MRI results from July 20, 2020 reviewed independently with right lobe liver lesion consistent with intrahepatic cholangiocarcinoma.   Biopsy confirmed the results.   Gynecologic work-up is essentially negative.   Colonoscopy on June 15, 2020 was essentially within normal limits.   Mammogram on July 19, 2020 was reported as BI-RADS 2.  All patient tumor markers are negative.   Patient has now had port placement secondary to poor IV access.   She received 7 cycles of cisplatin plus gemcitabine last given on 02/07/2021. Treatment has been on hold secondary to fevers and electrolyte derangement. She was hospitalized from 03/01/2021 - 03/04/2021 for fevers, nausea, vomiting and poor oral intake.   Symptoms are thought to be secondary to progressive cancer. She had MRI of her liver on 03/02/2021 which showed significant progression of dominant right hepatic lobe mass and liver lesions, enlargement of porta hepatis node.  Given progression of disease, we will not proceed with additional treatment at this time.   I have asked Altha Harm, palliative NP to assess patient today as well. Reviewed labs from 03/07/2021 which show stable hemoglobin 8.2 but worsening leukocytosis 33.2.  She just finished a course of antibiotics Levaquin.   Dehydration:  Secondary to high fevers.   Recommend holding chemotherapy and to giving 1 L normal saline   Leukocytosis/fevers: Secondary to progressive disease.  Hypokalemia: K 3.3 today Replete wtith 79mEq IV potassium today.   Disposition: Palliative care consult. Proceed with 1 L normal saline and 20 mEq potassium. RTC in 1 to 2 weeks to discuss plan of care with Dr. [name]. Per nursing staff, patient would like to hold off on her appointment at this time and would like to collect her medical records to take to [place].  Patient expressed understanding and was in agreement with this plan. She also understands that She can call clinic at any time with any questions, concerns, or complaints.    Cancer Staging Intrahepatic cholangiocarcinoma ([place]) Staging form: Intrahepatic Bile Duct, AJCC 8th Edition - Clinical stage from 08/04/2020: Stage IV (cT1b, cN0, pM1) - Signed by Lloyd Huger, MD on 08/04/2020  The patient's diagnosis, an outline of the further diagnostic and laboratory studies which will be required, the recommendation for surgery, and alternatives were discussed with her and her accompanying family members.  All questions were answered to their satisfaction.  I personally had a face to face interaction and evaluated the patient jointly with the NP Student, Mrs. Benedetto Goad.  I have reviewed her history and available  records and have performed the key portions of the physical exam including general, HEENT, abdominal exam, pelvic exam with my findings confirming those documented above by the APP student.  I have discussed the case with the APP student and the patient.  I agree with the above documentation, assessment and plan which was fully formulated by me.  Counseling was completed by me.    Benedetto Goad, Student FNP  Greater than 50% was spent in counseling and coordination of care with this patient including but not limited to discussion of the relevant topics above (See A&P) including, but not limited to diagnosis  and management of acute and chronic medical conditions.   Faythe Casa, NP 03/14/2021 6:35 AM

## 2021-03-14 ENCOUNTER — Encounter: Payer: Self-pay | Admitting: Oncology

## 2021-03-16 ENCOUNTER — Ambulatory Visit: Payer: BC Managed Care – PPO

## 2021-03-21 ENCOUNTER — Other Ambulatory Visit: Payer: BC Managed Care – PPO

## 2021-03-21 ENCOUNTER — Ambulatory Visit: Payer: BC Managed Care – PPO

## 2021-03-21 ENCOUNTER — Ambulatory Visit: Payer: BC Managed Care – PPO | Admitting: Oncology

## 2021-04-23 DEATH — deceased

## 2022-04-16 IMAGING — US US ABDOMEN COMPLETE
1 series · 14 of 25 positions shown · non-contrast
Comparison: MRI liver 07/20/2020

CLINICAL DATA: Right upper quadrant pain. History of
cholecystectomy. History of peritoneal carcinomatosis. History of
biopsy-proven carcinoma right lobe of liver.

EXAM:
ABDOMEN ULTRASOUND COMPLETE

[Series 1: us abdomen complete · 14 of 71 slices shown]
[im 1/71]
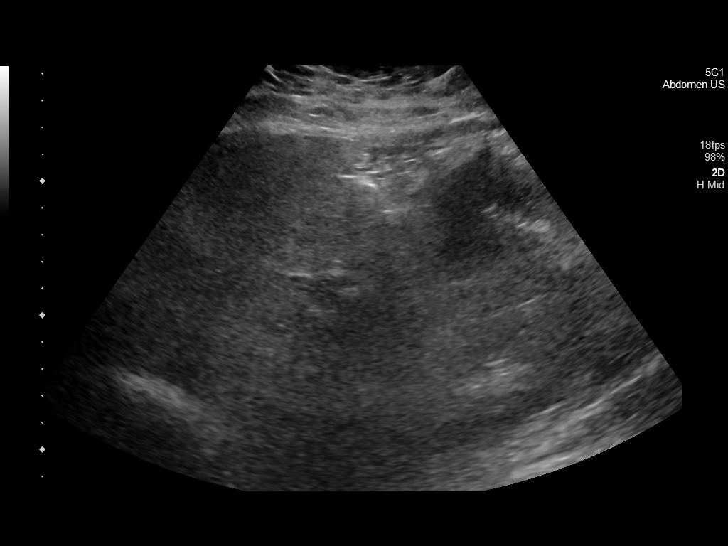
[im 6/71]
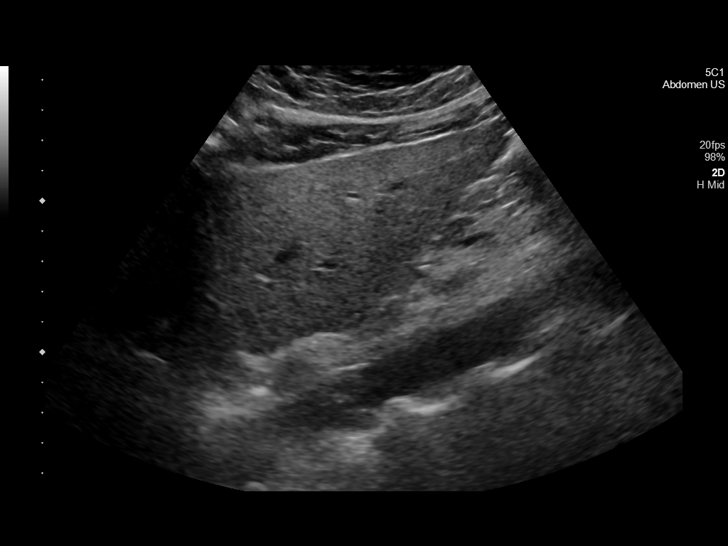
[im 12/71]
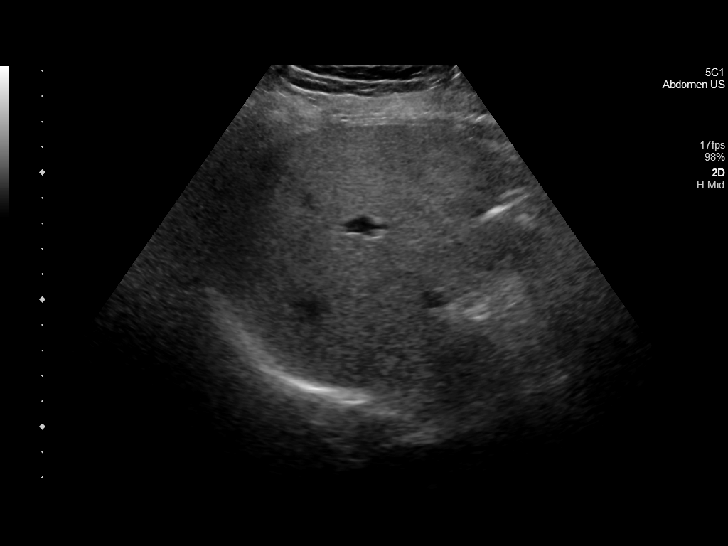
[im 18/71]
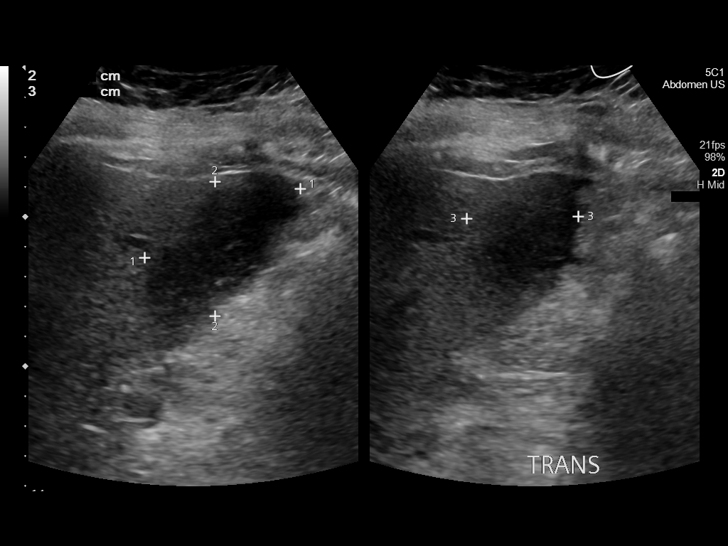
[im 24/71]
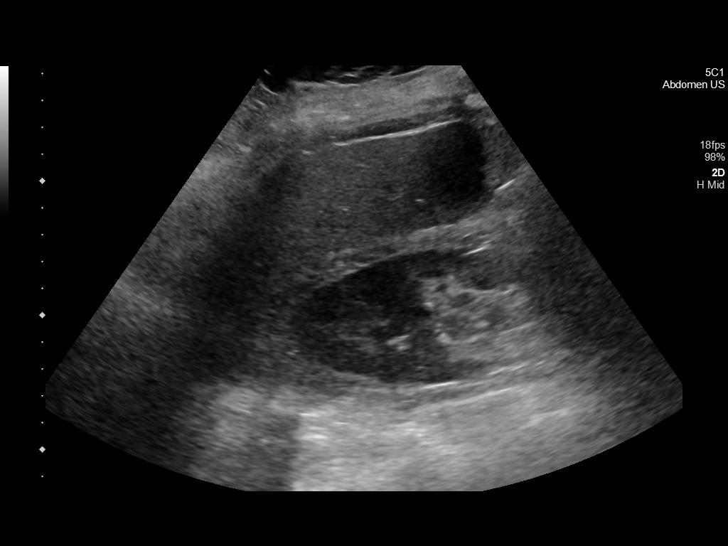
[im 27/71]
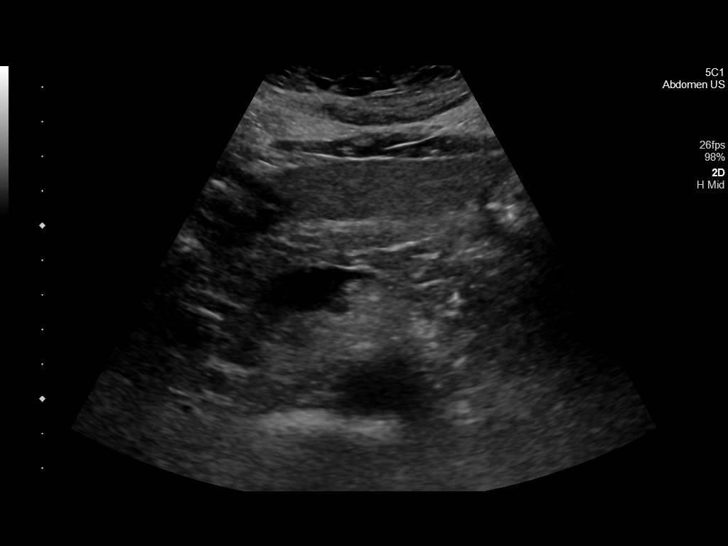
[im 33/71]
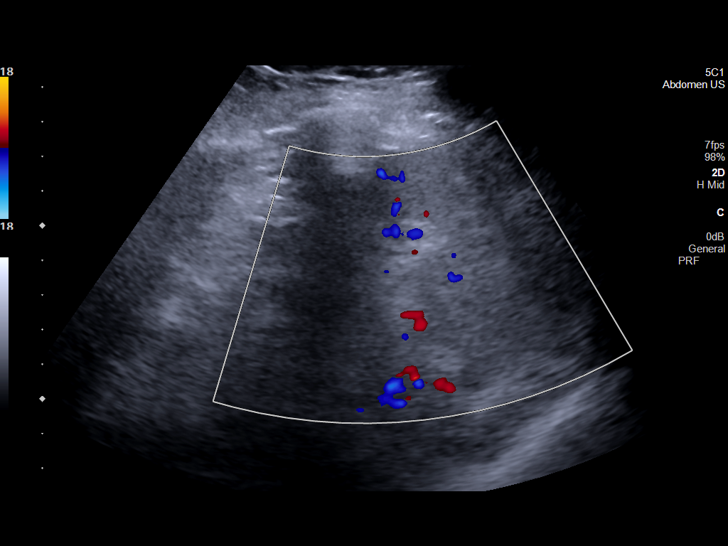
[im 38/71]
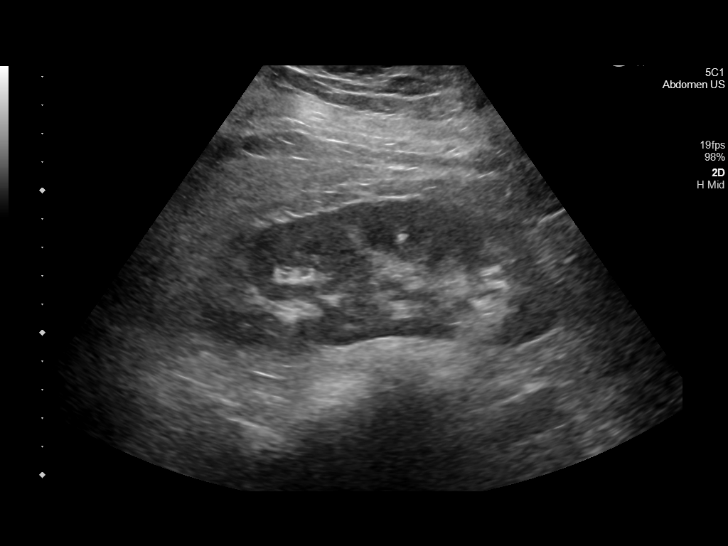
[im 44/71]
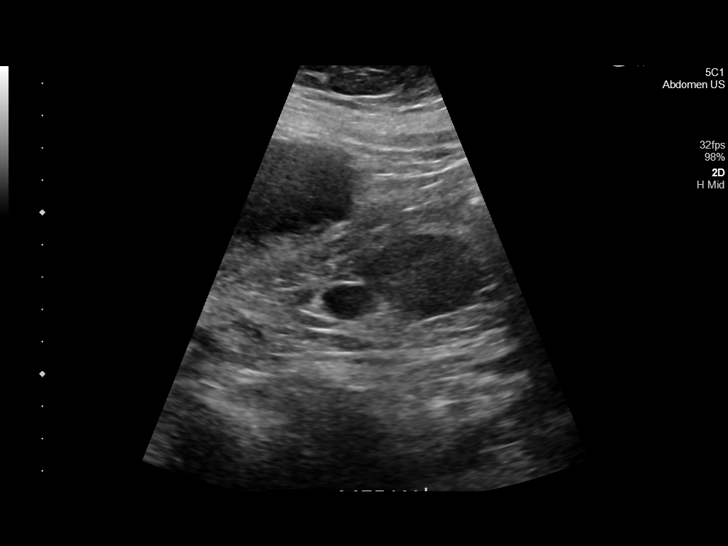
[im 47/71]
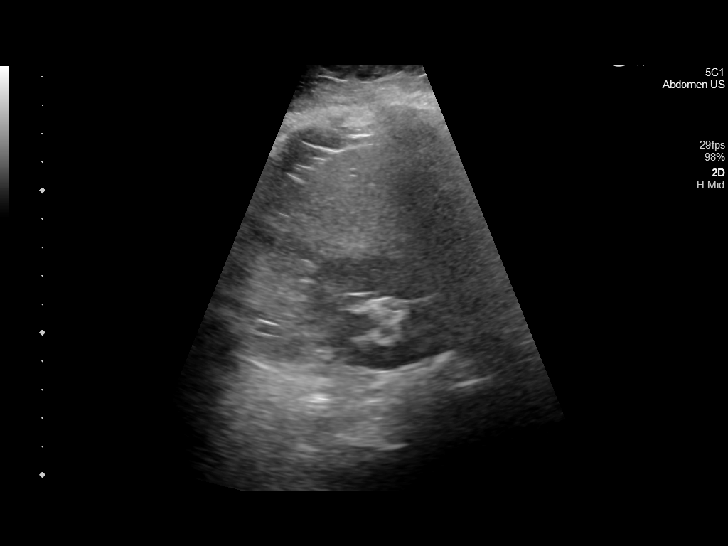
[im 53/71]
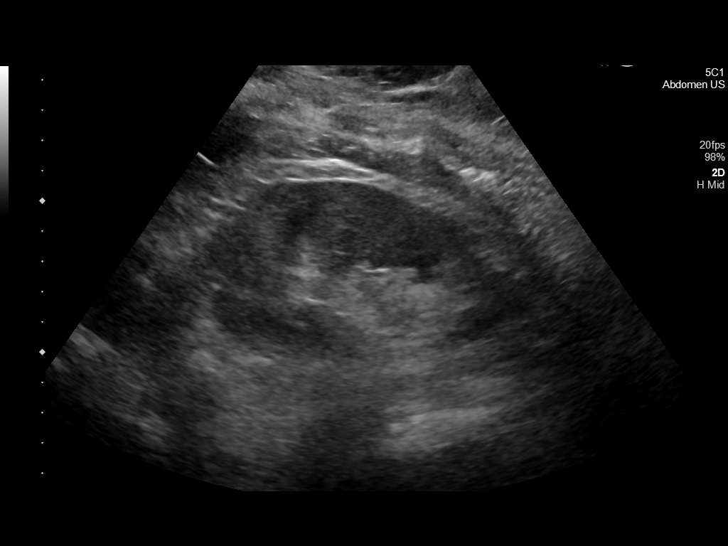
[im 59/71]
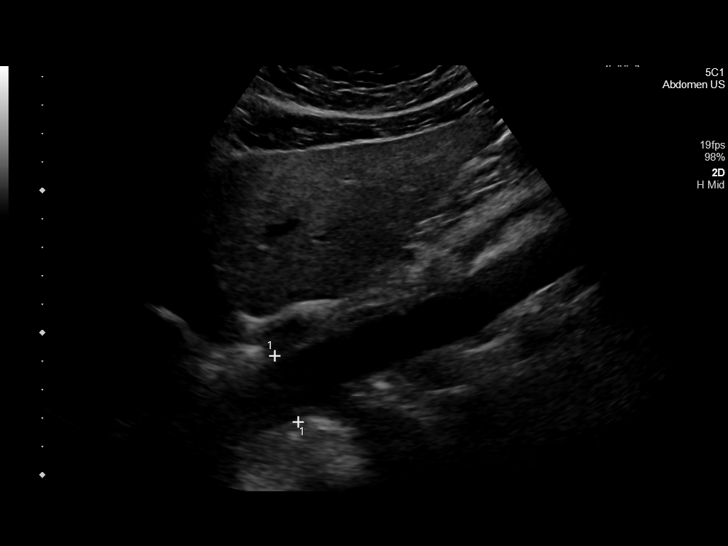
[im 65/71]
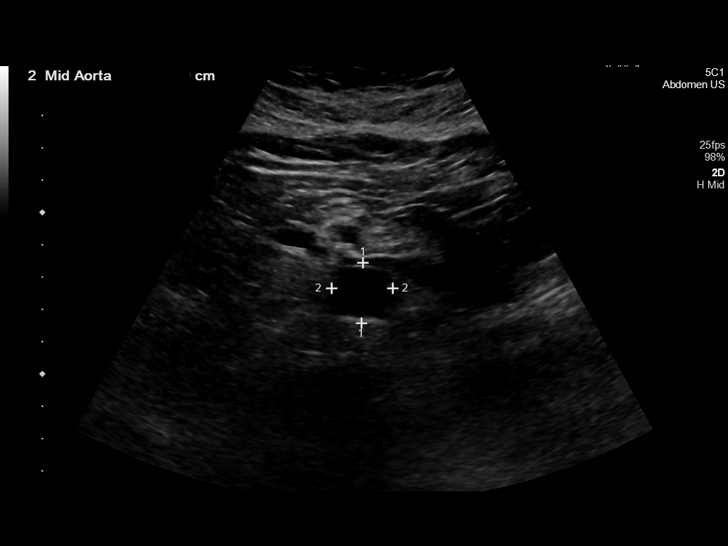
[im 71/71]
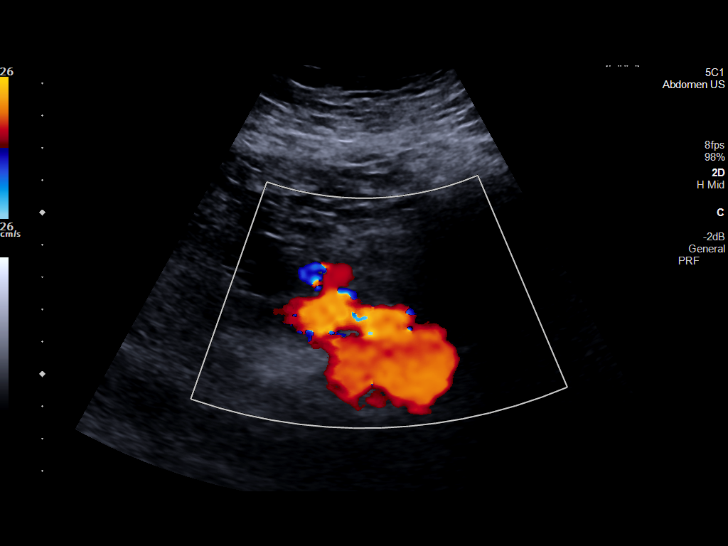

[14 of 25 positions shown; findings below may reference images not displayed]

FINDINGS: Gallbladder: Surgically absent

Common bile duct: Diameter: 4 mm

Liver: Hypoechoic mass in the anterior right lobe of the liver
measuring 5.7 x 4.5 x 3.7 cm. Portal vein is patent on color Doppler
imaging with normal direction of blood flow towards the liver.

IVC: No abnormality visualized.

Pancreas: Visualized portion unremarkable.

Spleen: Size and appearance within normal limits.

Right Kidney: Length: 13.3 cm. 2 cm right parapelvic cyst.
Echogenicity within normal limits. No mass or hydronephrosis
visualized.

Left Kidney: Length: 11.7 cm. Echogenicity within normal limits. No
mass or hydronephrosis visualized.

Abdominal aorta: No aneurysm visualized.

Other findings: Negative for ascites.
IMPRESSION: Mass lesion right lobe liver, known carcinoma.

Postop cholecystectomy.  No biliary dilatation.

No free fluid.

## 2022-05-14 IMAGING — US US RENAL
1 series · 14 of 25 positions shown · non-contrast
Comparison: 09/01/2020, CT 06/16/2020

CLINICAL DATA: Acute renal insufficiency

EXAM:
RENAL / URINARY TRACT ULTRASOUND COMPLETE

[Series 1: us renal · 14 of 39 slices shown]
[im 1/39]
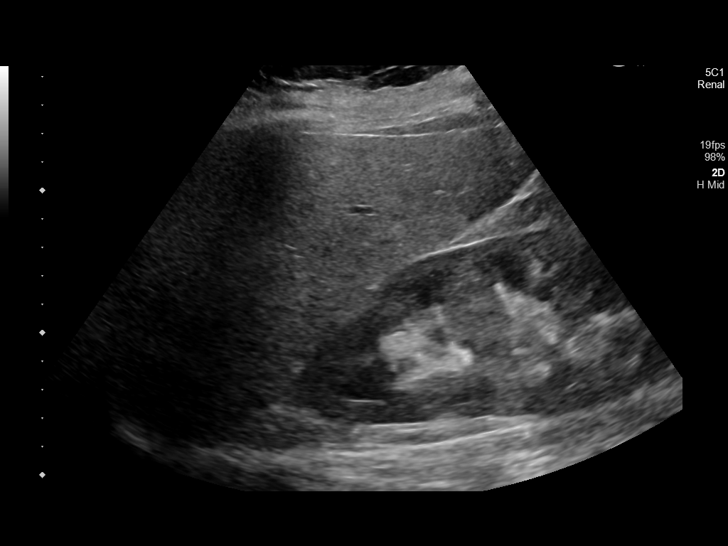
[im 4/39]
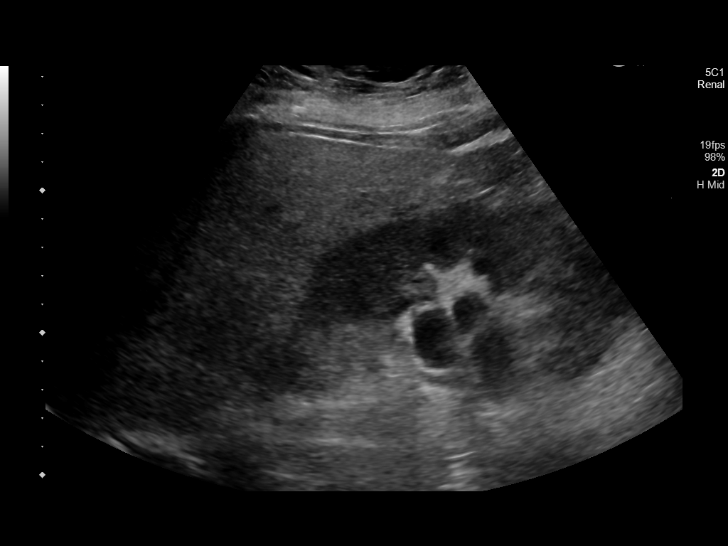
[im 7/39]
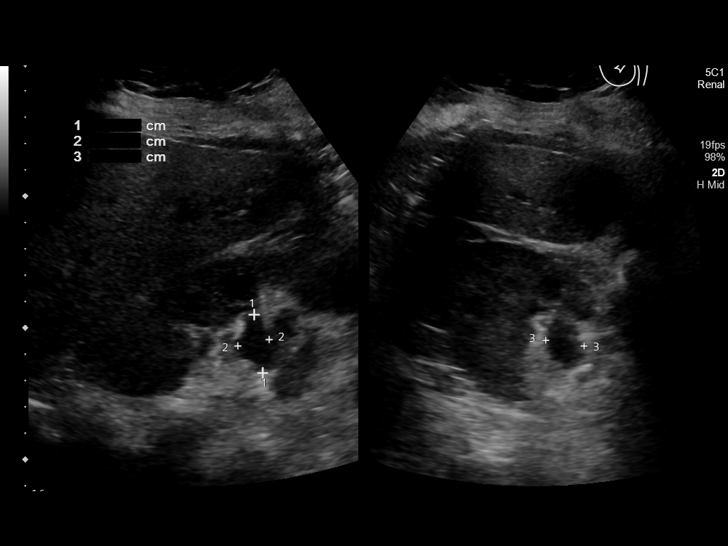
[im 10/39]
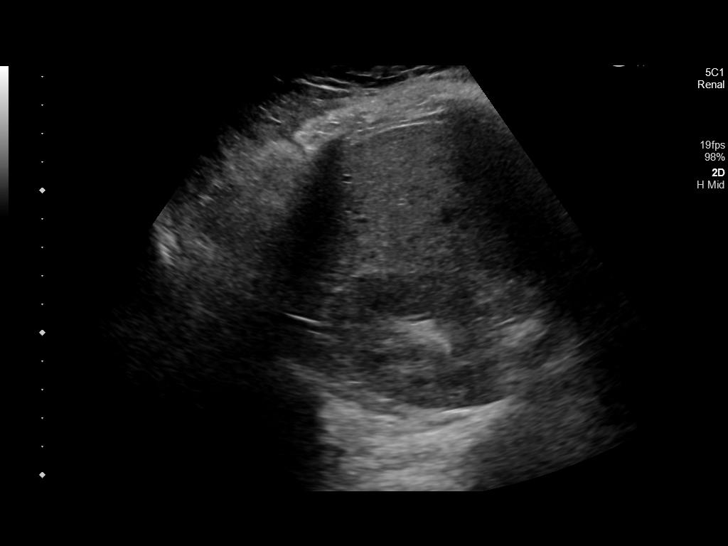
[im 13/39]
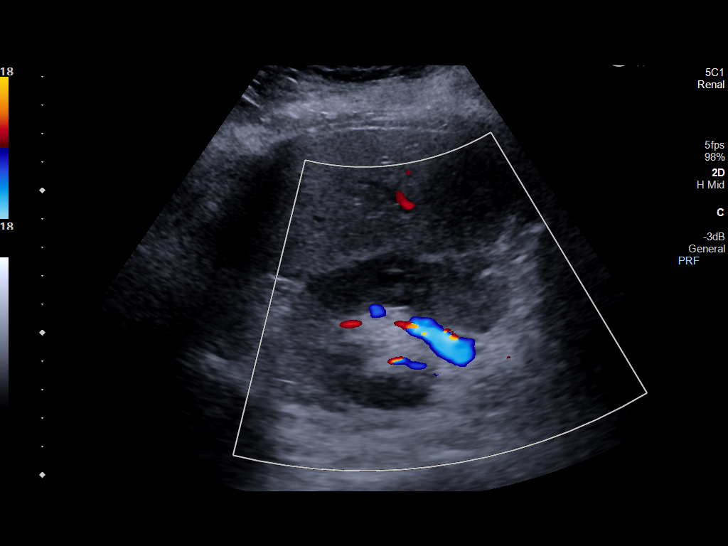
[im 15/39]
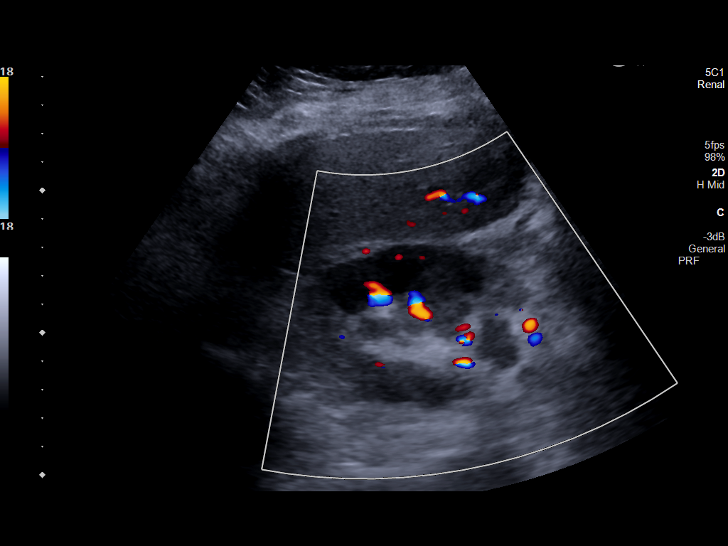
[im 18/39]
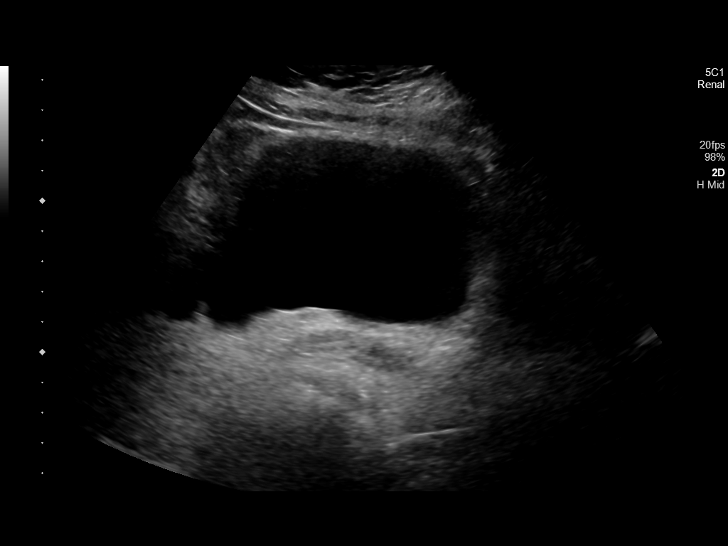
[im 21/39]
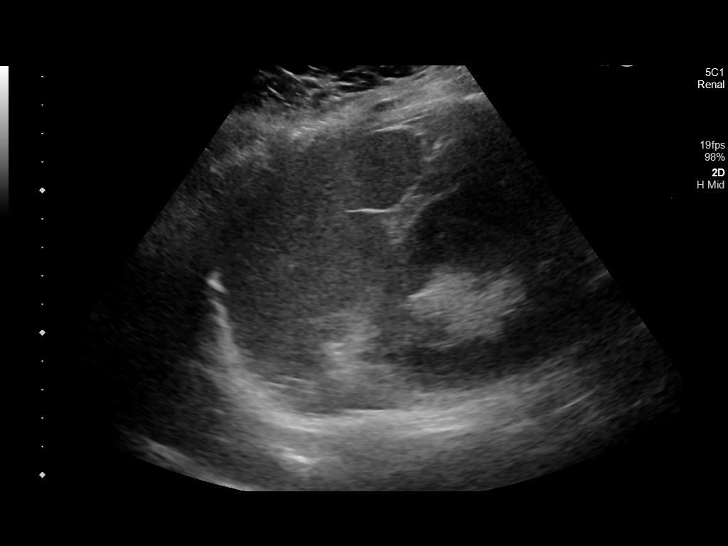
[im 24/39]
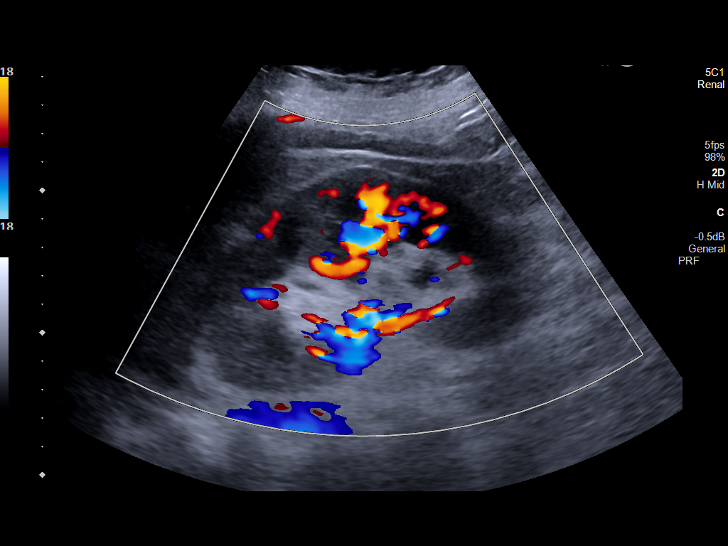
[im 26/39]
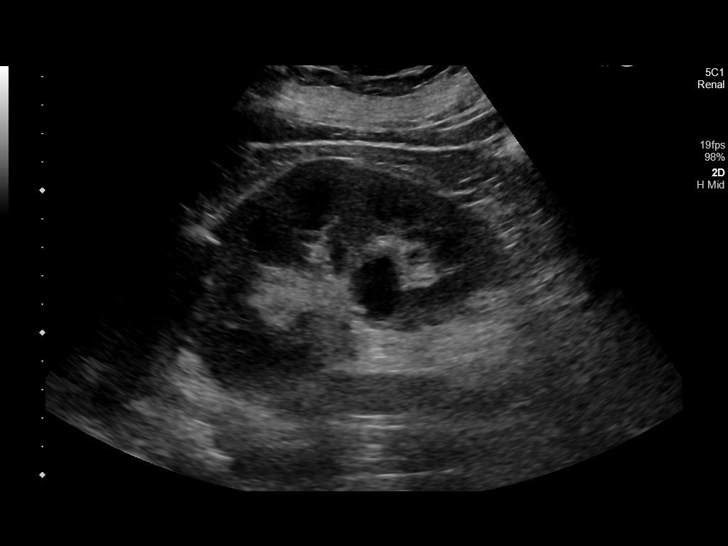
[im 29/39]
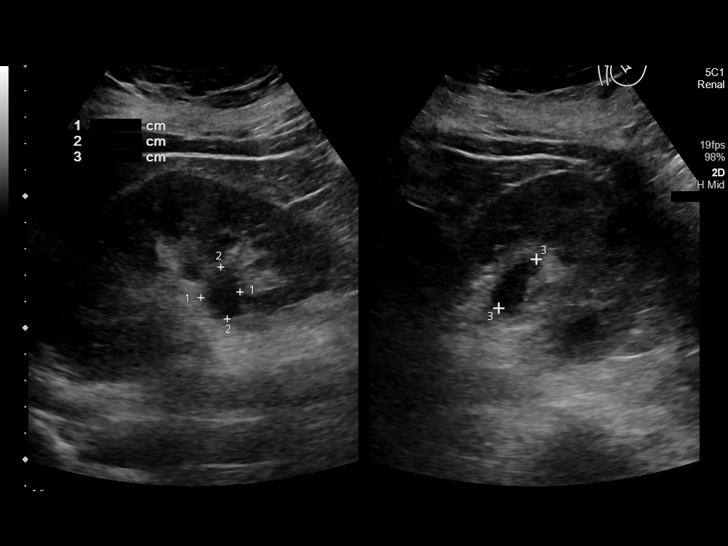
[im 32/39]
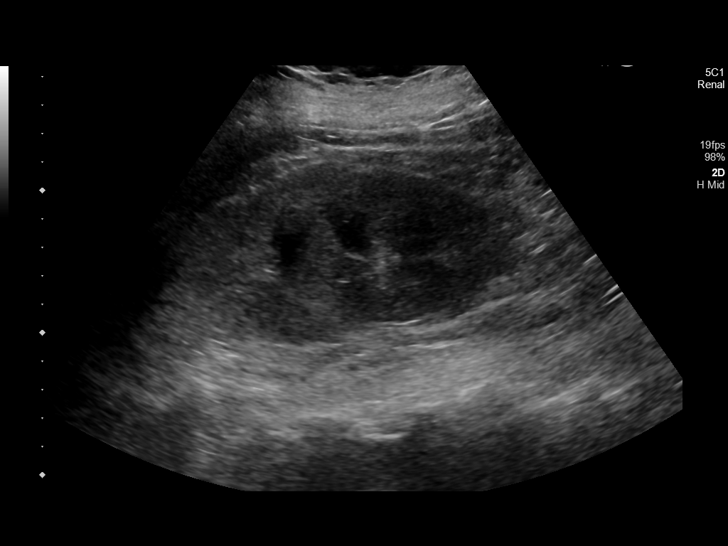
[im 35/39]
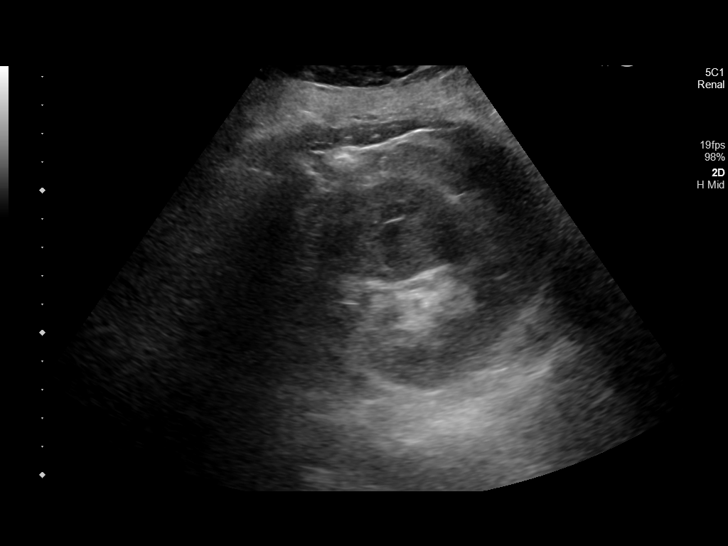
[im 39/39]
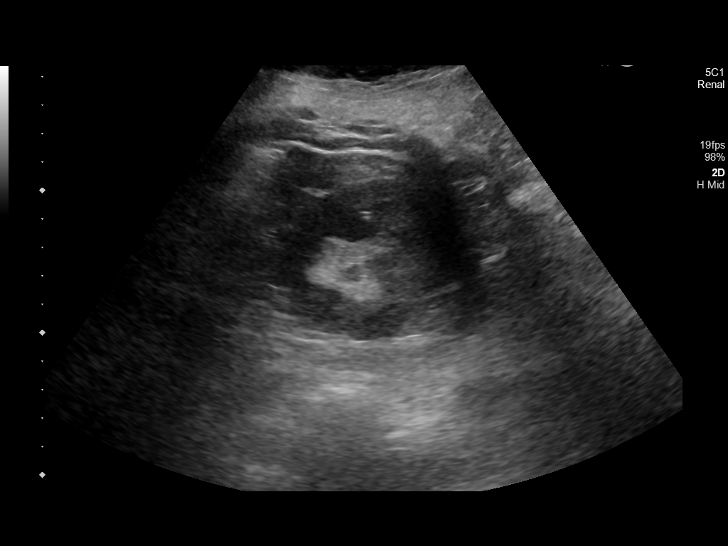

[14 of 25 positions shown; findings below may reference images not displayed]

FINDINGS: Right Kidney:

Renal measurements: 14.4 x 6.0 x 6.2 cm = volume: 277 mL. Renal
cortical thickness and cortical echogenicity are normal. No
intrarenal masses or calcifications are seen. There is mild
distension of the renal pelvis without associated caliectasis.

Left Kidney:

Renal measurements: 13.2 x 7.4 x 6.9 cm = volume: 351 mL. Renal
cortical thickness and cortical echogenicity are normal. No
intrarenal masses or calcifications are seen. There is mild
distension of the renal pelvis without associated caliectasis.

Bladder:

The bladder is mildly distended. No intraluminal masses or
calcifications are seen. Bilateral ureteral jets are identified.

Other:

None.
IMPRESSION: Mild distention of the renal pelves bilaterally possibly related to
urinary retention. No associated caliectasis. Otherwise normal renal
sonogram.

## 2022-07-16 IMAGING — MR MR ABDOMEN WO/W CM
17 series · 48 of 48 positions shown · IV contrast (gadavist)
Comparison: July 20, 2020 MRI evaluation.

CLINICAL DATA: Assess treatment response of cholangiocarcinoma.

EXAM:
MRI ABDOMEN WITHOUT AND WITH CONTRAST
TECHNIQUE: Multiplanar multisequence MR imaging of the abdomen was performed
both before and after the administration of intravenous contrast.
CONTRAST:  7mL GADAVIST GADOBUTROL 1 MMOL/ML IV SOLN

[Series 3: T2 · coronal · 6.0mm · 1.19mm/px · 1 of 30 slices shown (1 of 2)]
[im 1/30]
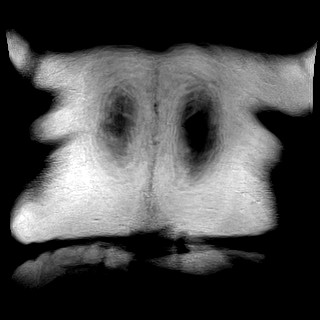

[Series 4: T2 · axial · 6.0mm · 1.19mm/px · 1 of 34 slices shown (2 of 2)]
[im 1/34]
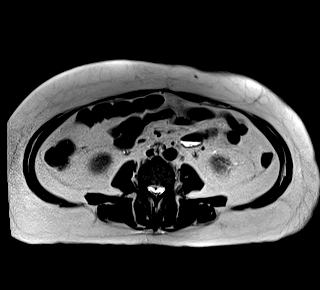

[Series 5: T1 · axial · 6.0mm · 0.74mm/px · 1 of 34 slices shown (1 of 2)]
[im 1/34]
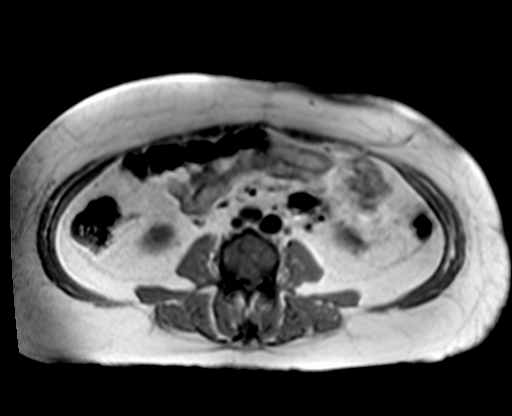

[Series 5: T1 · axial · 6.0mm · 0.74mm/px · 1 of 34 slices shown (2 of 2)]
[im 1/34]
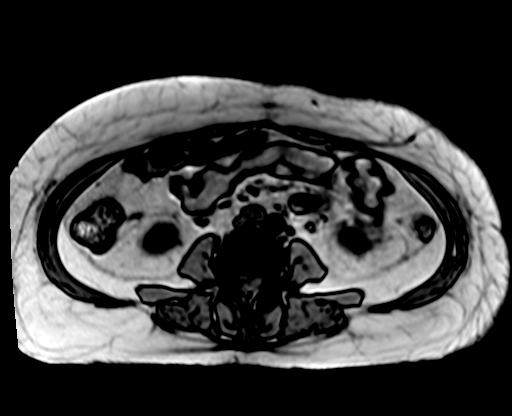

[Series 8: T2 fat-sat · axial · 6.0mm · 1.19mm/px · 1 of 34 slices shown]
[im 1/34]
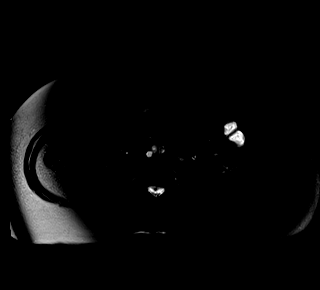

[Series 9: ax dwi_tracew · axial · 6.0mm · 1.42mm/px · z∈[-113,+125]mm · 3 of 102 slices shown]
[im 1/102]
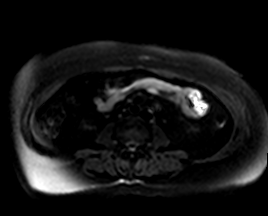
[im 51/102]
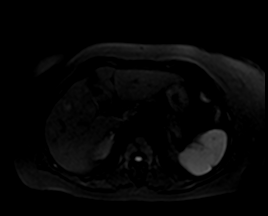
[im 102/102]
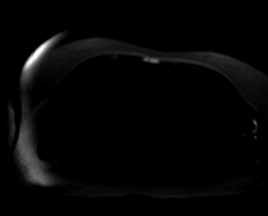

[Series 10: ax dwi_adc · axial · 6.0mm · 1.42mm/px · 1 of 34 slices shown]
[im 1/34]
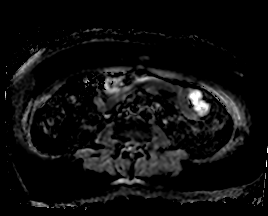

[Series 11: T1 dynamic fat-sat · axial · non-contrast · 3.0mm · 1.19mm/px · z∈[-116,+121]mm · 4 of 80 slices shown (1 of 5)]
[im 1/80]
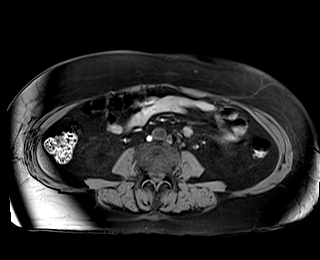
[im 27/80]
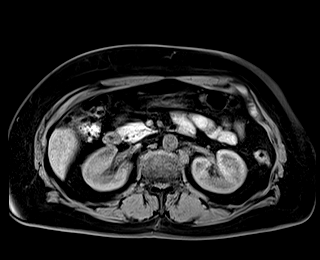
[im 53/80]
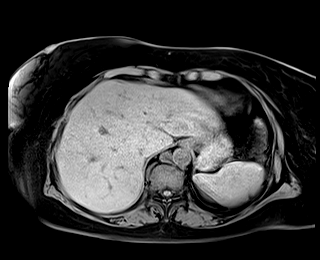
[im 80/80]
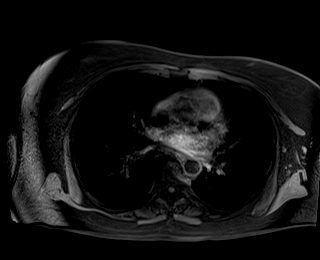

[Series 12: T1 dynamic fat-sat post-contrast · axial · 3.0mm · 1.19mm/px · z∈[-116,+121]mm · 4 of 80 slices shown (1 of 4)]
[im 1/80]
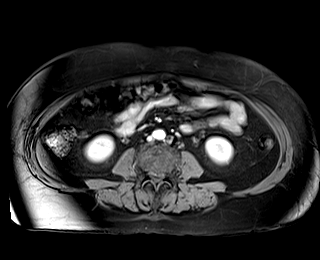
[im 27/80]
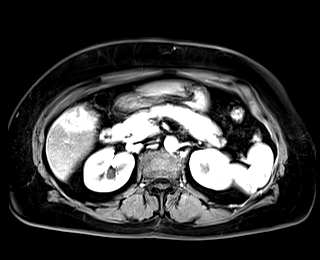
[im 53/80]
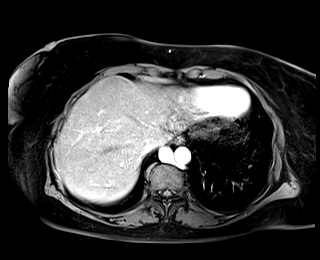
[im 80/80]
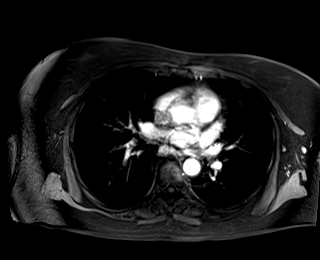

[Series 13: T1 dynamic fat-sat · axial · 3.0mm · 1.19mm/px · z∈[-116,+121]mm · 4 of 80 slices shown (2 of 5)]
[im 1/80]
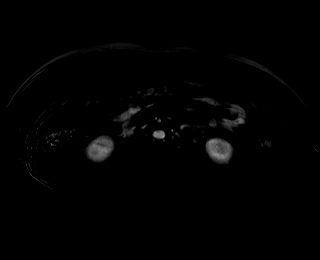
[im 27/80]
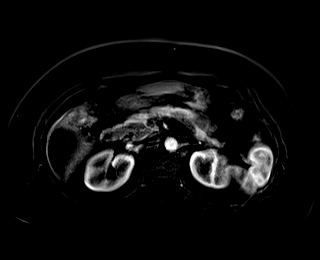
[im 53/80]
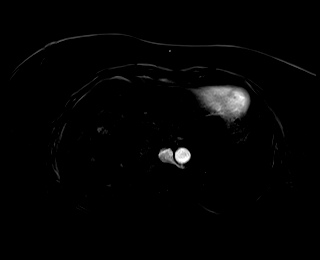
[im 80/80]
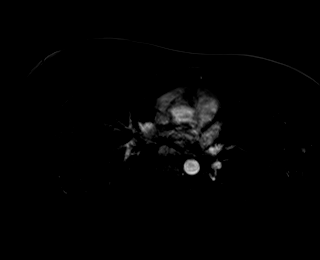

[Series 14: T1 dynamic fat-sat post-contrast · axial · 3.0mm · 1.19mm/px · z∈[-116,+121]mm · 4 of 80 slices shown (2 of 4)]
[im 1/80]
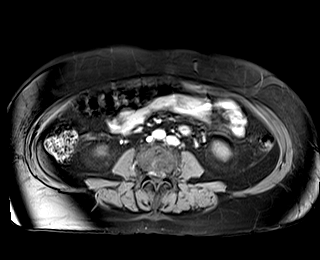
[im 27/80]
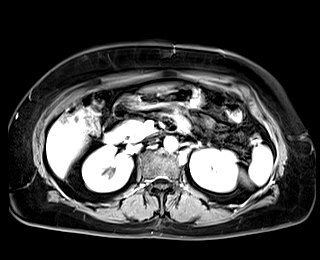
[im 53/80]
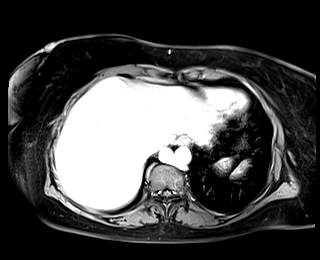
[im 80/80]
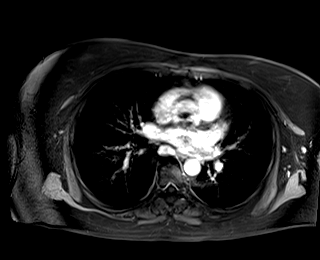

[Series 15: T1 dynamic fat-sat · axial · 3.0mm · 1.19mm/px · z∈[-116,+121]mm · 4 of 80 slices shown (3 of 5)]
[im 1/80]
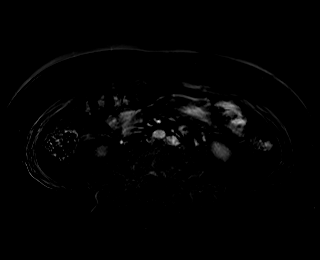
[im 27/80]
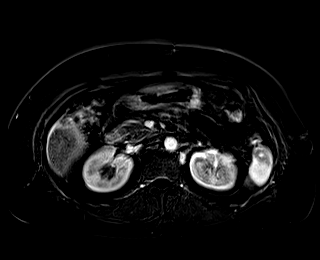
[im 53/80]
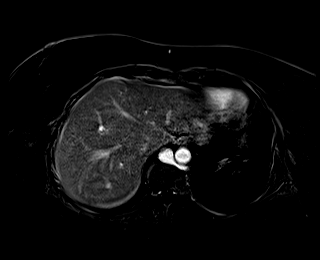
[im 80/80]
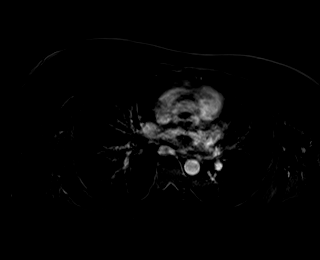

[Series 16: T1 dynamic fat-sat post-contrast · axial · 3.0mm · 1.19mm/px · z∈[-116,+121]mm · 4 of 80 slices shown (3 of 4)]
[im 1/80]
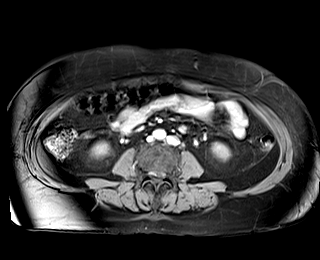
[im 27/80]
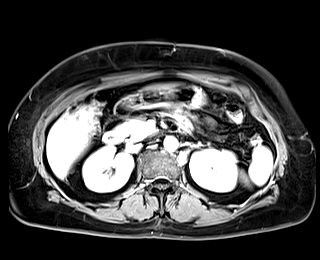
[im 53/80]
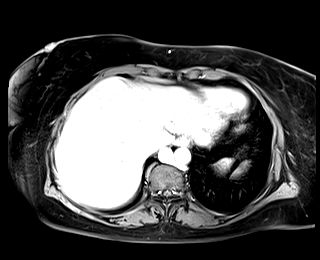
[im 80/80]
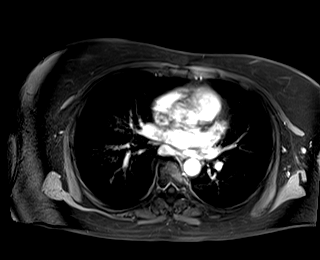

[Series 17: T1 dynamic fat-sat · axial · 3.0mm · 1.19mm/px · z∈[-116,+121]mm · 4 of 80 slices shown (4 of 5)]
[im 1/80]
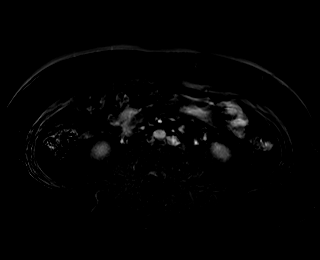
[im 27/80]
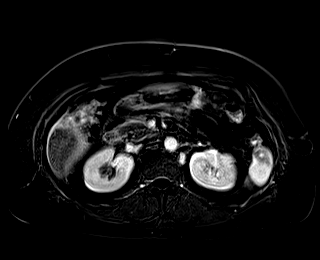
[im 53/80]
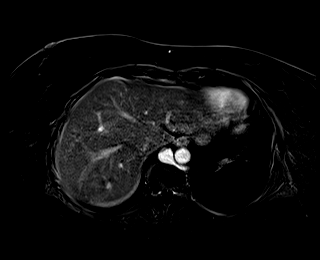
[im 80/80]
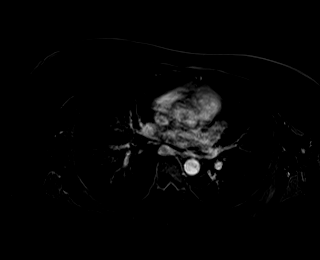

[Series 18: T1 dynamic post-contrast · coronal · 3.0mm · 1.31mm/px · 3 of 72 slices shown]
[im 1/72]
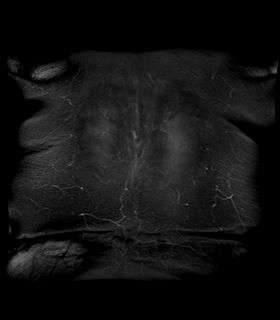
[im 36/72]
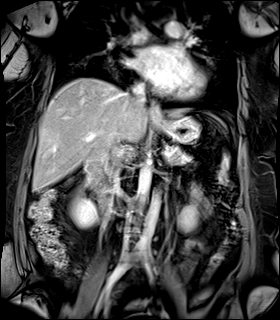
[im 72/72]
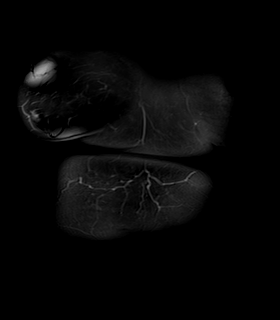

[Series 19: T1 dynamic fat-sat post-contrast · axial · 3.0mm · 1.19mm/px · z∈[-116,+121]mm · 4 of 80 slices shown (4 of 4)]
[im 1/80]
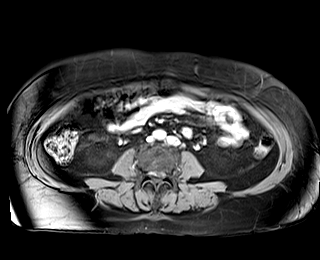
[im 27/80]
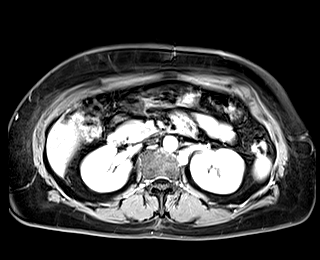
[im 53/80]
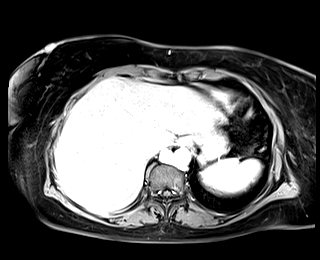
[im 80/80]
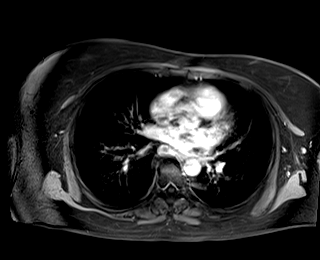

[Series 20: T1 dynamic fat-sat · axial · 3.0mm · 1.19mm/px · z∈[-116,+121]mm · 4 of 80 slices shown (5 of 5)]
[im 1/80]
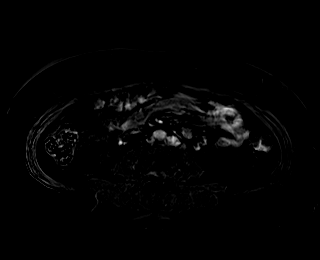
[im 27/80]
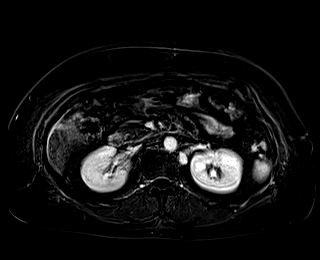
[im 53/80]
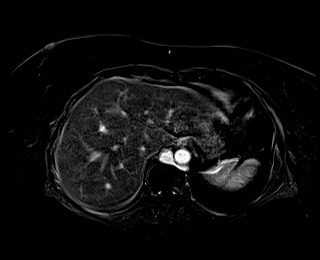
[im 80/80]
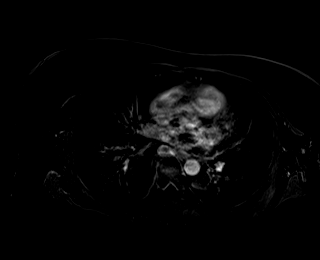

[48 of 48 positions shown; findings below may reference images not displayed]

FINDINGS: Lower chest: Incidental imaging of the lung bases on MRI is
unremarkable on limited assessment.

Hepatobiliary: Diffuse abnormal enhancement in the RIGHT hepatic
lobe in hepatic subsegment V shows fatty sparing and
hyperenhancement. Within the area of regional hyperenhancement is a
more focal area of enhancement displaying some washout, this is
along the gallbladder fossa best measured on image 54 of series September 24, 12 x 1.9 cm. This area was previously approximately 1.4 x 1.0 cm.
Gallbladder fossa is partially obscured by susceptibility artifact
from adjacent bowel gas. The lesion in this area may be smaller
measuring approximately 1.3 cm as compared to 1.8 cm previously.

Discrete hepatic lesion more central and cephalad seen only as a
subtle signal abnormality on delayed imaging, showing increased and
persistent enhancement rather than hypoenhancement centrally and is
not visible distinctly as a hypointense area on the arterial phase
as it was on the prior study. No new lesions are demonstrated.

Pancreas: Normal intrinsic T1 signal. No ductal dilation or sign of
inflammation. No focal lesion.

Spleen:  Normal spleen.

Adrenals/Urinary Tract:  Adrenal glands are normal.

Symmetric renal enhancement.  No hydronephrosis.

Stomach/Bowel: Gastrointestinal tract without acute process on
limited assessment.

Vascular/Lymphatic: Patent portal vein. Normal caliber abdominal
aorta. Persistent LEFT-sided IVC.

Other:  No ascites.

Musculoskeletal: No suspicious bone lesions identified.
IMPRESSION: 1. Area of transient hepatic intensity difference surrounding
lesions in the gallbladder fossa and hepatic subsegment V which
appear to be improving though areas in the gallbladder fossa show
limited assessment due to adjacent bowel gas and susceptibility
related artifact.
2. The discrete more central lesion seen on the previous imaging
study has changed in character, potentially related to healing of
lesion resolving in the liver. Attention on follow-up.
3. Periportal lymph nodes are diminished in size measuring less than
a cm on the current examination. Attention on follow-up.
4. Persistent LEFT-sided IVC.
